# Patient Record
Sex: Female | Born: 1960 | Race: Black or African American | Hispanic: No | Marital: Single | Smoking: Never smoker
Health system: Southern US, Community
[De-identification: ages and names within clinical notes are randomized; demographics above are authoritative.]

## PROBLEM LIST (undated history)

## (undated) DIAGNOSIS — E119 Type 2 diabetes mellitus without complications: Secondary | ICD-10-CM

## (undated) DIAGNOSIS — N858 Other specified noninflammatory disorders of uterus: Secondary | ICD-10-CM

## (undated) HISTORY — DX: Other specified noninflammatory disorders of uterus: N85.8

## (undated) HISTORY — PX: CATARACT EXTRACTION: SUR2

---

## 2002-02-20 ENCOUNTER — Ambulatory Visit (HOSPITAL_COMMUNITY): Admission: RE | Admit: 2002-02-20 | Discharge: 2002-02-20 | Payer: Self-pay | Admitting: Internal Medicine

## 2002-02-20 ENCOUNTER — Encounter: Payer: Self-pay | Admitting: Internal Medicine

## 2002-02-21 ENCOUNTER — Encounter: Payer: Self-pay | Admitting: Internal Medicine

## 2002-02-21 ENCOUNTER — Ambulatory Visit (HOSPITAL_COMMUNITY): Admission: RE | Admit: 2002-02-21 | Discharge: 2002-02-21 | Payer: Self-pay | Admitting: Internal Medicine

## 2003-06-24 ENCOUNTER — Inpatient Hospital Stay (HOSPITAL_COMMUNITY): Admission: EM | Admit: 2003-06-24 | Discharge: 2003-06-30 | Payer: Self-pay | Admitting: Emergency Medicine

## 2004-02-07 ENCOUNTER — Inpatient Hospital Stay (HOSPITAL_COMMUNITY): Admission: EM | Admit: 2004-02-07 | Discharge: 2004-02-12 | Payer: Self-pay | Admitting: Emergency Medicine

## 2004-02-11 ENCOUNTER — Ambulatory Visit: Payer: Self-pay | Admitting: *Deleted

## 2014-01-25 ENCOUNTER — Other Ambulatory Visit (HOSPITAL_COMMUNITY): Payer: Self-pay | Admitting: Family

## 2014-01-25 DIAGNOSIS — R1900 Intra-abdominal and pelvic swelling, mass and lump, unspecified site: Secondary | ICD-10-CM

## 2014-01-29 ENCOUNTER — Ambulatory Visit (HOSPITAL_COMMUNITY): Payer: Self-pay

## 2014-02-07 ENCOUNTER — Ambulatory Visit (HOSPITAL_COMMUNITY): Admission: RE | Admit: 2014-02-07 | Payer: Self-pay | Source: Ambulatory Visit

## 2014-02-07 ENCOUNTER — Ambulatory Visit (HOSPITAL_COMMUNITY): Payer: Self-pay

## 2014-08-29 LAB — HEMOGLOBIN A1C: Hemoglobin A1C: 14.7

## 2014-08-30 ENCOUNTER — Other Ambulatory Visit (HOSPITAL_COMMUNITY): Payer: Self-pay | Admitting: Family

## 2014-08-30 DIAGNOSIS — R1900 Intra-abdominal and pelvic swelling, mass and lump, unspecified site: Secondary | ICD-10-CM

## 2014-09-06 ENCOUNTER — Other Ambulatory Visit (HOSPITAL_COMMUNITY): Payer: Self-pay

## 2014-10-26 ENCOUNTER — Ambulatory Visit (HOSPITAL_COMMUNITY): Payer: Medicaid Other

## 2014-10-26 ENCOUNTER — Ambulatory Visit (HOSPITAL_COMMUNITY)
Admission: RE | Admit: 2014-10-26 | Discharge: 2014-10-26 | Disposition: A | Discharge status: Home / Self Care | Payer: Medicaid Other | Source: Ambulatory Visit | Attending: Family | Admitting: Family

## 2014-10-26 ENCOUNTER — Other Ambulatory Visit (HOSPITAL_COMMUNITY): Payer: Self-pay | Admitting: Family

## 2014-10-26 DIAGNOSIS — R1900 Intra-abdominal and pelvic swelling, mass and lump, unspecified site: Secondary | ICD-10-CM

## 2014-10-26 DIAGNOSIS — R1904 Left lower quadrant abdominal swelling, mass and lump: Secondary | ICD-10-CM | POA: Diagnosis not present

## 2014-11-21 ENCOUNTER — Emergency Department (HOSPITAL_COMMUNITY): Payer: Medicaid Other

## 2014-11-21 ENCOUNTER — Encounter (HOSPITAL_COMMUNITY): Payer: Self-pay

## 2014-11-21 ENCOUNTER — Emergency Department (HOSPITAL_COMMUNITY)
Admission: EM | Admit: 2014-11-21 | Discharge: 2014-11-21 | Disposition: A | Discharge status: Home / Self Care | Payer: Medicaid Other | Attending: Emergency Medicine | Admitting: Emergency Medicine

## 2014-11-21 DIAGNOSIS — S0990XA Unspecified injury of head, initial encounter: Secondary | ICD-10-CM | POA: Insufficient documentation

## 2014-11-21 DIAGNOSIS — Y9389 Activity, other specified: Secondary | ICD-10-CM | POA: Insufficient documentation

## 2014-11-21 DIAGNOSIS — E109 Type 1 diabetes mellitus without complications: Secondary | ICD-10-CM | POA: Insufficient documentation

## 2014-11-21 DIAGNOSIS — Z79899 Other long term (current) drug therapy: Secondary | ICD-10-CM | POA: Insufficient documentation

## 2014-11-21 DIAGNOSIS — Y998 Other external cause status: Secondary | ICD-10-CM | POA: Insufficient documentation

## 2014-11-21 DIAGNOSIS — W01198A Fall on same level from slipping, tripping and stumbling with subsequent striking against other object, initial encounter: Secondary | ICD-10-CM | POA: Insufficient documentation

## 2014-11-21 DIAGNOSIS — R000 Tachycardia, unspecified: Secondary | ICD-10-CM | POA: Insufficient documentation

## 2014-11-21 DIAGNOSIS — R140 Abdominal distension (gaseous): Secondary | ICD-10-CM | POA: Insufficient documentation

## 2014-11-21 DIAGNOSIS — Z7982 Long term (current) use of aspirin: Secondary | ICD-10-CM | POA: Diagnosis not present

## 2014-11-21 DIAGNOSIS — R200 Anesthesia of skin: Secondary | ICD-10-CM | POA: Diagnosis not present

## 2014-11-21 DIAGNOSIS — E108 Type 1 diabetes mellitus with unspecified complications: Secondary | ICD-10-CM

## 2014-11-21 DIAGNOSIS — Z794 Long term (current) use of insulin: Secondary | ICD-10-CM | POA: Insufficient documentation

## 2014-11-21 DIAGNOSIS — Y9289 Other specified places as the place of occurrence of the external cause: Secondary | ICD-10-CM | POA: Insufficient documentation

## 2014-11-21 DIAGNOSIS — W19XXXA Unspecified fall, initial encounter: Secondary | ICD-10-CM

## 2014-11-21 HISTORY — DX: Type 2 diabetes mellitus without complications: E11.9

## 2014-11-21 LAB — CBC WITH DIFFERENTIAL/PLATELET
Basophils Absolute: 0 10*3/uL (ref 0.0–0.1)
Basophils Relative: 0 %
Eosinophils Absolute: 0 10*3/uL (ref 0.0–0.7)
Eosinophils Relative: 1 %
HCT: 42.5 % (ref 36.0–46.0)
Hemoglobin: 14.9 g/dL (ref 12.0–15.0)
Lymphocytes Relative: 18 %
Lymphs Abs: 1.6 10*3/uL (ref 0.7–4.0)
MCH: 29 pg (ref 26.0–34.0)
MCHC: 35.1 g/dL (ref 30.0–36.0)
MCV: 82.7 fL (ref 78.0–100.0)
Monocytes Absolute: 0.8 10*3/uL (ref 0.1–1.0)
Monocytes Relative: 10 %
Neutro Abs: 6.1 10*3/uL (ref 1.7–7.7)
Neutrophils Relative %: 71 %
Platelets: 194 10*3/uL (ref 150–400)
RBC: 5.14 MIL/uL — ABNORMAL HIGH (ref 3.87–5.11)
RDW: 12.6 % (ref 11.5–15.5)
WBC: 8.5 10*3/uL (ref 4.0–10.5)

## 2014-11-21 LAB — COMPREHENSIVE METABOLIC PANEL WITH GFR
ALT: 9 U/L — ABNORMAL LOW (ref 14–54)
AST: 18 U/L (ref 15–41)
Albumin: 4.3 g/dL (ref 3.5–5.0)
Alkaline Phosphatase: 49 U/L (ref 38–126)
Anion gap: 13 (ref 5–15)
BUN: 18 mg/dL (ref 6–20)
CO2: 24 mmol/L (ref 22–32)
Calcium: 9.5 mg/dL (ref 8.9–10.3)
Chloride: 100 mmol/L — ABNORMAL LOW (ref 101–111)
Creatinine, Ser: 0.86 mg/dL (ref 0.44–1.00)
GFR calc Af Amer: >60 mL/min
GFR calc non Af Amer: >60 mL/min
Glucose, Bld: 313 mg/dL — ABNORMAL HIGH (ref 65–99)
Potassium: 3.7 mmol/L (ref 3.5–5.1)
Sodium: 137 mmol/L (ref 135–145)
Total Bilirubin: 0.7 mg/dL (ref 0.3–1.2)
Total Protein: 8 g/dL (ref 6.5–8.1)

## 2014-11-21 LAB — URINALYSIS, ROUTINE W REFLEX MICROSCOPIC
Bilirubin Urine: NEGATIVE
Glucose, UA: >1000 mg/dL — AB
Ketones, ur: 15 mg/dL — AB
Leukocytes, UA: NEGATIVE
Nitrite: NEGATIVE
Protein, ur: NEGATIVE mg/dL
Specific Gravity, Urine: 1.02 (ref 1.005–1.030)
Urobilinogen, UA: 0.2 mg/dL (ref 0.0–1.0)
pH: 5.5 (ref 5.0–8.0)

## 2014-11-21 LAB — CBG MONITORING, ED: Glucose-Capillary: 283 mg/dL — ABNORMAL HIGH (ref 65–99)

## 2014-11-21 LAB — URINE MICROSCOPIC-ADD ON

## 2014-11-21 LAB — D-DIMER, QUANTITATIVE: D-Dimer, Quant: 0.93 ug{FEU}/mL — ABNORMAL HIGH (ref 0.00–0.48)

## 2014-11-21 MED ORDER — SODIUM CHLORIDE 0.9 % IV BOLUS (SEPSIS)
1000.0000 mL | Freq: Once | INTRAVENOUS | Status: AC
  Administered 2014-11-21: 1000 mL via INTRAVENOUS

## 2014-11-21 MED ORDER — IOHEXOL 350 MG/ML SOLN
100.0000 mL | Freq: Once | INTRAVENOUS | Status: AC | PRN
  Administered 2014-11-21: 100 mL via INTRAVENOUS

## 2014-11-21 MED ORDER — IOHEXOL 350 MG/ML SOLN: 80.0000 mL | Freq: Once | INTRAVENOUS | Status: DC | PRN

## 2014-11-21 NOTE — Discharge Instructions (Signed)
Tests showed no life-threatening condition. You must eat regular meals, increase fluids, take your insulin, check your glucose.

## 2014-11-21 NOTE — ED Notes (Signed)
Pt made aware to return if symptoms worsen or if any life threatening symptoms occur.   

## 2014-11-21 NOTE — ED Provider Notes (Addendum)
CSN: [id number]     Arrival date & time 11/21/14  0815 History  By signing my name below, I, Tula Nakayama, attest that this documentation has been prepared under the direction and in the presence of Nat Christen, MD.  Electronically Signed: Tula Nakayama, ED Scribe. 11/21/2014. 9:05 AM.   Chief Complaint  Patient presents with  . Fall   The history is provided by the patient. No language interpreter was used.    HPI Comments: Katherine Patton is a 54 y.o. female with a history of DM who presents to the Emergency Department complaining of constant, moderate left-sided head pain that started 2 days ago after she fell from a standing position. She states swelling of her abdomen that started a few days ago and numbness in her bilateral LE that started 1 month ago as associated symptoms. She also reports decreased fluid intake in the last few days. Pt reports that fall occurred secondary to numbness in her legs. She hit her left parietal head in the fall, but denies LOC. Pt takes Metformin and Levemir for DM, but has not measured blood sugar because she ran out of testing strips. She lives with her boyfriend. Pt denies visual disturbances, constipation, decreased appetite, fever, vomiting and diarrhea. Apparently patient has not been taking her insulin and not eating.  PCP Bradd Burner, [facility]  Past Medical History  Diagnosis Date  . Diabetes mellitus without complication [facility])    Past Surgical History  Procedure Laterality Date  . Cataract extraction     No family history on file. Social History  Substance Use Topics  . Smoking status: Never Smoker   . Smokeless tobacco: None  . Alcohol Use: No   OB History    No data available     Review of Systems  Constitutional: Negative for appetite change.  Eyes: Negative for visual disturbance.  Gastrointestinal: Positive for abdominal distention. Negative for vomiting, diarrhea and constipation.  Neurological: Positive  for numbness and headaches.  All other systems reviewed and are negative.  Allergies  Review of patient's allergies indicates no known allergies.  Home Medications   Prior to Admission medications   Medication Sig Start Date End Date Taking? Authorizing Provider  acetaminophen (TYLENOL) 500 MG tablet Take 500 mg by mouth every 6 (six) hours as needed for headache.   Yes Historical Provider, MD  aspirin 325 MG tablet Take 325 mg by mouth daily as needed for moderate pain.   Yes Historical Provider, MD  Difluprednate (DUREZOL) 0.05 % EMUL Apply 1 drop to eye 2 (two) times daily.   Yes Historical Provider, MD  insulin detemir (LEVEMIR) 100 UNIT/ML injection Inject 20 Units into the skin at bedtime.   Yes Historical Provider, MD  lisinopril (PRINIVIL,ZESTRIL) 2.5 MG tablet Take 2.5 mg by mouth daily.   Yes Historical Provider, MD  metFORMIN (GLUCOPHAGE) 500 MG tablet Take 500 mg by mouth 2 (two) times daily with a meal.   Yes Historical Provider, MD   BP 146/93 mmHg  Pulse 122  Temp(Src) 97.6 F (36.4 C) (Oral)  Resp 13  Ht 5' (1.524 m)  Wt 100 lb (45.36 kg)  BMI 19.53 kg/m2  SpO2 100%  LMP  Physical Exam  Constitutional: She appears well-developed and well-nourished. No distress.  HENT:  Head: Normocephalic and atraumatic.  Mild tenderness of left parietal area  Eyes: Conjunctivae and EOM are normal.  Neck: Neck supple. No tracheal deviation present.  Cardiovascular: Regular rhythm and normal heart sounds.  Tachycardic  Pulmonary/Chest: Effort normal and breath sounds normal. No respiratory distress. She has no wheezes.  Abdominal: She exhibits distension. There is no tenderness.  Skin: Skin is warm and dry.  Psychiatric: She has a normal mood and affect. Her behavior is normal.  Nursing note and vitals reviewed.   ED Course  Procedures  DIAGNOSTIC STUDIES: Oxygen Saturation is 100% on RA, normal by my interpretation.    COORDINATION OF CARE: 9:05 AM Discussed treatment  plan with pt which includes lab work. Pt agreed to plan.  Labs Review Labs Reviewed  COMPREHENSIVE METABOLIC PANEL - Abnormal; Notable for the following:    Chloride 100 (*)    Glucose, Bld 313 (*)    ALT 9 (*)    All other components within normal limits  CBC WITH DIFFERENTIAL/PLATELET - Abnormal; Notable for the following:    RBC 5.14 (*)    All other components within normal limits  URINALYSIS, ROUTINE W REFLEX MICROSCOPIC (NOT AT [facility]) - Abnormal; Notable for the following:    Glucose, UA >1000 (*)    Hgb urine dipstick TRACE (*)    Ketones, ur 15 (*)    All other components within normal limits  D-DIMER, QUANTITATIVE (NOT AT [facility]) - Abnormal; Notable for the following:    D-Dimer, Quant 0.93 (*)    All other components within normal limits  CBG MONITORING, ED - Abnormal; Notable for the following:    Glucose-Capillary 283 (*)    All other components within normal limits  URINE MICROSCOPIC-ADD ON    Imaging Review Ct Head Wo Contrast  11/21/2014  CLINICAL DATA:  Fall Sunday and struck back of head. To falls reported. Numbness in the legs. Diabetic. EXAM: CT HEAD WITHOUT CONTRAST TECHNIQUE: Contiguous axial images were obtained from the base of the skull through the vertex without intravenous contrast. COMPARISON:  None. FINDINGS: No intracranial hemorrhage. No parenchymal contusion. No midline shift or mass effect. Basilar cisterns are patent. No skull base fracture. No fluid in the paranasal sinuses or mastoid air cells. Orbits are normal. There is mild subcortical white matter hypodensities. IMPRESSION: 1. No intracranial trauma. 2. Subcortical white matter hypodensities consistent small vessel ischemia. Electronically Signed   By: Suzy Bouchard M.D.   On: 11/21/2014 11:36   Ct Angio Chest Pe W/cm &/or Wo Cm  11/21/2014  CLINICAL DATA:  fell SUnday and Monday and struck her head x 2. Reports she fell because her legs feel numb. Reports legs have been numb for the past few  months. Pt says head feels sore. Pt ran out of glucose test strips EXAM: CT ANGIOGRAPHY CHEST WITH CONTRAST TECHNIQUE: Multidetector CT imaging of the chest was performed using the standard protocol during bolus administration of intravenous contrast. Multiplanar CT image reconstructions and MIPs were obtained to evaluate the vascular anatomy. CONTRAST:  155mL OMNIPAQUE IOHEXOL 350 MG/ML SOLN COMPARISON:  None. FINDINGS: Left arm IV contrast injection. The innominate vein and SVC are patent. The RV is nondilated. Satisfactory opacification of pulmonary arteries noted, and there is no evidence of pulmonary emboli. Patent bilateral pulmonary veins. Adequate contrast opacification of the thoracic aorta with no evidence of dissection, aneurysm, or stenosis. There is classic 3-vessel brachiocephalic arch anatomy without proximal stenosis. No pleural or pericardial effusion. No hilar or mediastinal adenopathy. Linear scarring or atelectasis in the apical segment right upper lobe. Lungs otherwise clear. Thoracic spine and sternum intact. Hepatomegaly, without focal lesion, liver incompletely visualized. Remainder visualized upper abdomen unremarkable. Review of the MIP images  confirms the above findings. IMPRESSION: 1. Negative for acute PE or thoracic aortic dissection. 2. Hepatomegaly. Electronically Signed   By: Lucrezia Europe M.D.   On: 11/21/2014 11:24   I have personally reviewed and evaluated these lab results as part of my medical decision-making.   EKG Interpretation   Date/Time:  Wednesday November 21 2014 08:27:58 EST Ventricular Rate:  134 PR Interval:  126 QRS Duration: 63 QT Interval:  294 QTC Calculation: 439 R Axis:   49 Text Interpretation:  Sinus tachycardia LAE, consider biatrial enlargement  Artifact in lead(s) II aVR and baseline wander in lead(s) II aVR aVF  Confirmed by Dianey Suchy  MD, Adrien Dietzman ([id number]) on 11/21/2014 9:12:12 AM      MDM   Final diagnoses:  Fall, initial encounter  Type 1  diabetes mellitus with complication ([place])    Patient is in no acute distress. CT head and CT angio chest negative for acute findings. Her glucose is 313 but she is not in DKA.  She remains tachycardic. Discussed results with the patient and her sister and daughter. She will seek primary care follow-up.  I personally performed the services described in this documentation, which was scribed in my presence. The recorded information has been reviewed and is accurate.        Nat Christen, MD 11/21/14 [place], MD 11/21/14 [phone]

## 2014-11-21 NOTE — ED Notes (Signed)
Pt reports she fell SUnday and Monday and struck her head x 2.  Reports she fell because her legs feel numb.  Reports legs have been numb for the past few months.  Pt says head feels sore.  Pt ran out of glucose test strips and has been unable to check her sugar.

## 2014-11-28 LAB — HEMOGLOBIN A1C: HEMOGLOBIN A1C: 11.2

## 2015-03-06 LAB — HEMOGLOBIN A1C: Hemoglobin A1C: 15.6

## 2015-05-10 ENCOUNTER — Ambulatory Visit: Payer: Self-pay | Admitting: "Endocrinology

## 2015-06-19 ENCOUNTER — Encounter: Payer: Self-pay | Admitting: "Endocrinology

## 2015-06-19 ENCOUNTER — Ambulatory Visit (INDEPENDENT_AMBULATORY_CARE_PROVIDER_SITE_OTHER): Payer: Medicaid Other | Admitting: "Endocrinology

## 2015-06-19 ENCOUNTER — Encounter: Payer: Medicaid Other | Attending: "Endocrinology | Admitting: Nutrition

## 2015-06-19 VITALS — BP 162/96 | HR 123 | Ht 60.0 in | Wt 89.6 lb

## 2015-06-19 DIAGNOSIS — E1065 Type 1 diabetes mellitus with hyperglycemia: Secondary | ICD-10-CM

## 2015-06-19 DIAGNOSIS — IMO0002 Reserved for concepts with insufficient information to code with codable children: Secondary | ICD-10-CM

## 2015-06-19 DIAGNOSIS — E108 Type 1 diabetes mellitus with unspecified complications: Secondary | ICD-10-CM | POA: Diagnosis not present

## 2015-06-19 DIAGNOSIS — E1069 Type 1 diabetes mellitus with other specified complication: Secondary | ICD-10-CM

## 2015-06-19 DIAGNOSIS — E43 Unspecified severe protein-calorie malnutrition: Secondary | ICD-10-CM

## 2015-06-19 DIAGNOSIS — E118 Type 2 diabetes mellitus with unspecified complications: Secondary | ICD-10-CM | POA: Insufficient documentation

## 2015-06-19 MED ORDER — INSULIN GLULISINE 100 UNIT/ML SOLOSTAR PEN: 5.0000 [IU] | PEN_INJECTOR | Freq: Three times a day (TID) | SUBCUTANEOUS | Status: DC

## 2015-06-19 NOTE — Progress Notes (Signed)
Subjective:    Patient ID: Katherine Patton, female    DOB: August 02, 1960. Patient is being seen in consultation for management of diabetes requested by Bradd Burner, FNP.  Past Medical History  Diagnosis Date  . Diabetes mellitus without complication [facility])    Past Surgical History  Procedure Laterality Date  . Cataract extraction     Social History   Social History  . Marital Status: Single    Spouse Name: N/A  . Number of Children: N/A  . Years of Education: N/A   Social History Main Topics  . Smoking status: Never Smoker   . Smokeless tobacco: None  . Alcohol Use: No  . Drug Use: No  . Sexual Activity: Not Asked   Other Topics Concern  . None   Social History Narrative   Outpatient Encounter Prescriptions as of 06/19/2015  Medication Sig  . atenolol (TENORMIN) 25 MG tablet Take 25 mg by mouth daily.  Marland Kitchen gabapentin (NEURONTIN) 300 MG capsule Take 300 mg by mouth 2 (two) times daily.  . insulin detemir (LEVEMIR) 100 UNIT/ML injection Inject 40 Units into the skin at bedtime.   Marland Kitchen lisinopril (PRINIVIL,ZESTRIL) 2.5 MG tablet Take 2.5 mg by mouth daily.  . metFORMIN (GLUCOPHAGE) 500 MG tablet Take 500 mg by mouth 2 (two) times daily with a meal.  . acetaminophen (TYLENOL) 500 MG tablet Take 500 mg by mouth every 6 (six) hours as needed for headache.  Marland Kitchen aspirin 325 MG tablet Take 325 mg by mouth daily as needed for moderate pain.  . Difluprednate (DUREZOL) 0.05 % EMUL Apply 1 drop to eye 2 (two) times daily.   No facility-administered encounter medications on file as of 06/19/2015.   ALLERGIES: No Known Allergies VACCINATION STATUS:  There is no immunization history on file for this patient.  Diabetes She presents for her initial diabetic visit. She has type 1 (She is administratively classified as type 1 diabetes for management purposes.) diabetes mellitus. Onset time: She was diagnosed at approximate age of 26 years. Her disease course has been worsening. There are no  hypoglycemic associated symptoms. Pertinent negatives for hypoglycemia include no confusion, headaches, pallor or seizures. Associated symptoms include blurred vision, fatigue, polydipsia, polyuria and weight loss. Pertinent negatives for diabetes include no chest pain and no polyphagia. There are no hypoglycemic complications. Symptoms are worsening. Diabetic complications include peripheral neuropathy. Risk factors for coronary artery disease include diabetes mellitus, sedentary lifestyle and hypertension. Current diabetic treatment includes insulin injections and oral agent (monotherapy) (She is on Levemir 40 units daily at bedtime and metformin 1000 g by mouth twice a day). Her weight is decreasing steadily. She is following a generally unhealthy diet. When asked about meal planning, she reported none. She has not had a previous visit with a dietitian. She never participates in exercise. Home blood sugar record trend: She did not bring any meter nor logs to review today. An ACE inhibitor/angiotensin II receptor blocker is being taken. Eye exam is current.  Hypertension This is a chronic problem. The current episode started more than 1 year ago. Associated symptoms include blurred vision. Pertinent negatives include no chest pain, headaches, palpitations or shortness of breath. Risk factors for coronary artery disease include diabetes mellitus, dyslipidemia and sedentary lifestyle. Past treatments include ACE inhibitors and beta blockers.     Review of Systems  Constitutional: Positive for weight loss and fatigue. Negative for fever, chills and unexpected weight change.  HENT: Negative for trouble swallowing and voice change.  Eyes: Positive for blurred vision. Negative for visual disturbance.  Respiratory: Negative for cough, shortness of breath and wheezing.   Cardiovascular: Negative for chest pain, palpitations and leg swelling.  Gastrointestinal: Negative for nausea, vomiting and diarrhea.   Endocrine: Positive for polydipsia and polyuria. Negative for cold intolerance, heat intolerance and polyphagia.  Musculoskeletal: Positive for gait problem. Negative for myalgias and arthralgias.  Skin: Negative for color change, pallor, rash and wound.  Neurological: Negative for seizures and headaches.  Psychiatric/Behavioral: Negative for suicidal ideas and confusion.    Objective:    BP 162/96 mmHg  Pulse 123  Ht 5' (1.524 m)  Wt 89 lb 9.6 oz (40.642 kg)  BMI 17.50 kg/m2  SpO2 95%  Wt Readings from Last 3 Encounters:  06/19/15 89 lb 9.6 oz (40.642 kg)  11/21/14 100 lb (45.36 kg)    Physical Exam  Constitutional: She is oriented to person, place, and time.  Cachectic, chronically sick-looking  HENT:  Head: Normocephalic and atraumatic.  Eyes: EOM are normal.  Neck: Normal range of motion. Neck supple. No tracheal deviation present. No thyromegaly present.  Cardiovascular: Normal rate and regular rhythm.   Pulmonary/Chest: Effort normal and breath sounds normal.  Abdominal: Soft. Bowel sounds are normal. There is no tenderness. There is no guarding.  Musculoskeletal: She exhibits no edema.  She uses a cane to walk around. She has a global loss of skeletal muscle mass.  Neurological: She is alert and oriented to person, place, and time. She has normal reflexes. No cranial nerve deficit. Coordination normal.  Skin: Skin is warm and dry. No rash noted. No erythema. No pallor.  Psychiatric: She has a normal mood and affect. Judgment normal.    CMP     Component Value Date/Time   NA 137 11/21/2014 0900   K 3.7 11/21/2014 0900   CL 100* 11/21/2014 0900   CO2 24 11/21/2014 0900   GLUCOSE 313* 11/21/2014 0900   BUN 18 11/21/2014 0900   CREATININE 0.86 11/21/2014 0900   CALCIUM 9.5 11/21/2014 0900   PROT 8.0 11/21/2014 0900   ALBUMIN 4.3 11/21/2014 0900   AST 18 11/21/2014 0900   ALT 9* 11/21/2014 0900   ALKPHOS 49 11/21/2014 0900   BILITOT 0.7 11/21/2014 0900    GFRNONAA >60 11/21/2014 0900   GFRAA >60 11/21/2014 0900    Her A1c from 03/06/2015 was extremely high at 15.6%.   Assessment & Plan:    1. Uncontrolled type 1 diabetes mellitus with other specified complication [facility])  - Patient has currently uncontrolled symptomatic type 1 DM ( administrative reclassified as type I for management purposes) since  55 years of age,  with most recent A1c of 15.6 %.  -She has no recent labs to review, she would be sent for labs after her next visit in 1 week.    - Her diabetes is complicated by neuropathy, and recent unexplained weight loss and patient remains at a high risk for more acute and chronic complications of diabetes which include CAD, CVA, CKD, retinopathy, and neuropathy. These are all discussed in detail with the patient.  - I have counseled the patient on diet management  by adopting a carbohydrate restricted/protein rich diet.   - I encouraged the patient to switch to  and consume more unprocessed or minimally processed complex starch and increased protein intake (animal or plant source), fruits, and vegetables.  - Patient is advised to stick to a routine mealtimes to eat 3 meals  a day and some  light snacks, since she has a big weight deficit.   - The patient will be scheduled with Jearld Fenton, RDN, CDE for individualized DM education.  - I have approached patient with the following individualized plan to manage diabetes and patient agrees:   - She will be treated exclusively with insulin -basal /bolus basal insulin.  - To facilitate this, I have approached her for  strict monitoring of glucose  AC and HS. -I will readjust her basal insulin Levemir to 30 units daily at bedtime, initiate prandial insulin Apidra 5 units 3 times a day before meals for pre-meal blood glucose above 90 mg/dL. She will also use some Apidra for sliding scale when pre-meal blood glucose is above 150 mg/dL. - Patient is warned not to take insulin without proper  monitoring per orders. -Adjustment parameters are given for hypo and hyperglycemia in writing. -Patient is encouraged to call clinic for blood glucose levels less than 70 or above 300 mg /dl.  - I will discontinue  metformin, risk outweighs benefit for this patient.  - She is not a candidate for metformin, SGLT2 inhibitors, nor  incretin therapy.   - Patient specific target  A1c;  LDL, HDL, Triglycerides, and  Waist Circumference were discussed in detail.  2) BP/HTN:  Controlled. Continue current medications including ACEI/ARB. 3) Lipids/HPL:  Uncontrolled, LDL at 111. She will be considered for statin therapy on subsequent visits. 4)  Weight/Diet: CDE Consult will be initiated , exercise, and detailed carbohydrates information provided.  5) Chronic Care/Health Maintenance:  -Patient is on ACEI/ARB medications and encouraged to continue to follow up with Ophthalmology, Podiatrist at least yearly or according to recommendations, and advised to   stay away from smoking. I have recommended yearly flu vaccine and pneumonia vaccination at least every 5 years; moderate intensity exercise for up to 150 minutes weekly; and  sleep for at least 7 hours a day.  - 60 minutes of time was spent on the care of this patient , 50% of which was applied for counseling on diabetes complications and their preventions.  - Patient to bring meter and  blood glucose logs during their next visit.   - I advised patient to maintain close follow up with House, Deliah Goody, FNP for primary care needs.  Follow up plan: - No Follow-up on file.  Glade Lloyd, MD Phone: [phone]  Fax: [phone]   06/19/2015, 11:23 AM

## 2015-06-26 ENCOUNTER — Encounter: Payer: Self-pay | Admitting: "Endocrinology

## 2015-06-26 ENCOUNTER — Encounter: Payer: Medicaid Other | Admitting: Nutrition

## 2015-06-26 ENCOUNTER — Ambulatory Visit (INDEPENDENT_AMBULATORY_CARE_PROVIDER_SITE_OTHER): Payer: Medicaid Other | Admitting: "Endocrinology

## 2015-06-26 ENCOUNTER — Encounter: Payer: Self-pay | Admitting: Nutrition

## 2015-06-26 VITALS — BP 146/90 | HR 116 | Ht 60.0 in | Wt 92.4 lb

## 2015-06-26 VITALS — Ht 60.0 in | Wt 92.0 lb

## 2015-06-26 DIAGNOSIS — IMO0002 Reserved for concepts with insufficient information to code with codable children: Secondary | ICD-10-CM | POA: Insufficient documentation

## 2015-06-26 DIAGNOSIS — E1065 Type 1 diabetes mellitus with hyperglycemia: Secondary | ICD-10-CM

## 2015-06-26 DIAGNOSIS — E1069 Type 1 diabetes mellitus with other specified complication: Secondary | ICD-10-CM

## 2015-06-26 DIAGNOSIS — E108 Type 1 diabetes mellitus with unspecified complications: Principal | ICD-10-CM

## 2015-06-26 DIAGNOSIS — R636 Underweight: Secondary | ICD-10-CM

## 2015-06-26 DIAGNOSIS — D259 Leiomyoma of uterus, unspecified: Secondary | ICD-10-CM | POA: Insufficient documentation

## 2015-06-26 DIAGNOSIS — R1900 Intra-abdominal and pelvic swelling, mass and lump, unspecified site: Secondary | ICD-10-CM

## 2015-06-26 DIAGNOSIS — I10 Essential (primary) hypertension: Secondary | ICD-10-CM | POA: Diagnosis not present

## 2015-06-26 DIAGNOSIS — E43 Unspecified severe protein-calorie malnutrition: Secondary | ICD-10-CM

## 2015-06-26 DIAGNOSIS — E118 Type 2 diabetes mellitus with unspecified complications: Secondary | ICD-10-CM | POA: Diagnosis not present

## 2015-06-26 MED ORDER — INSULIN ASPART 100 UNIT/ML FLEXPEN: 8.0000 [IU] | PEN_INJECTOR | Freq: Three times a day (TID) | SUBCUTANEOUS | Status: DC

## 2015-06-26 MED ORDER — LISINOPRIL 10 MG PO TABS: 10.0000 mg | ORAL_TABLET | Freq: Every day | ORAL | Status: DC

## 2015-06-26 NOTE — Progress Notes (Signed)
  Medical Nutrition Therapy:  Appt start time: 1200 end time:  1230.   Assessment:  Primary concerns today: Diabetes Type 1. She feels better. BS meter and logs brought in. BS still higher in 200-300's but overall much better. She has cut out sodas, sweets, junk food.  Working on eating three meals per day. Drinking only water. Limited access to food due to no income. Gets $20 for food stamps. Eats three meals per day. Eating more vegetables and fresher meats.  Saw Dr. Dorris Fetch today.  Gained 3 lbs since last week. Taking  30 units of Levemir, stopped Metformin and will use Apidra with meals. She is taking meds prescribed. She feels like she is eating better and healthier foods. Handout given for local food pantries for food assistance. She is very emaciated and cachetic. No body fat. Signs of muscle wasting. Bitemporal wasting, skeletal muscles protruding with bony  Prominents. Current condition may be related to poor diabetes control.  Hair is dry and brittle. Skin very thin. She has protein calorie malnutrition. Walks with a cane. Very feeble in walking.   Preferred Learning Style:   Auditory  Visual  Hands on    Learning Readiness:     Ready  Change in progress   MEDICATIONS: See list   DIETARY INTAKE:  24Hr: B)  Eggs/susage biscuit sometimes; hungry man tv dinner, water Nonel L) Broccoli, rice and beef, water Dinner: CHicken nuggests, water   Usual physical activity: ADL  Estimated energy needs: 1500 calories 170 g carbohydrates 112 g protein 42 g fat  Progress Towards Goal(s):  In progress.   Nutritional Diagnosis:  NB-1.1 Food and nutrition-related knowledge deficit As related to Diabetes.  As evidenced by A1C >15%.    Intervention: . High Calorie High Protein Diet. Ways to add calories and protein with all meals.  Goals 1. Follow Plate MEthod 2. Increase  calories with whole milk, adding gravy, butter/margerine.  3. Inject insulin in legs for now. 5. Eat more  3 carb choices per meal, 6. Drink 1 carnation insteat breakfast daily. 7. Nurse, adult for food assistance.  Teaching Method Utilized:  Visual Auditory Hands on  Handouts given during visit include:  The Plate Method  Meal Plan Card  Ways to increase protein and calories..   Barriers to learning/adherence to lifestyle change:  Feeble due to physical condition.   Demonstrated degree of understanding via:  Teach Back   Monitoring/Evaluation:  Dietary intake, exercise, meal planning, SBG, and body weight in 2 week(s).

## 2015-06-26 NOTE — Patient Instructions (Signed)
Goals 1. Follow Plate MEthod 2. Increase  calories with whole milk, adding gravy, butter/margerine.  3. Inject insulin in legs for now. 5. Eat more 3 carb choices per meal, 6. Drink 1 carnation insteat breakfast daily. 7. Nurse, adult for food assistance.

## 2015-06-26 NOTE — Progress Notes (Signed)
Subjective:    Patient ID: Katherine Patton, female    DOB: [birth year]. Patient is being seen in f/u for management of diabetes requested by Bradd Burner, FNP.  Past Medical History  Diagnosis Date  . Diabetes mellitus without complication [facility])    Past Surgical History  Procedure Laterality Date  . Cataract extraction     Social History   Social History  . Marital Status: Single    Spouse Name: N/A  . Number of Children: N/A  . Years of Education: N/A   Social History Main Topics  . Smoking status: Never Smoker   . Smokeless tobacco: None  . Alcohol Use: No  . Drug Use: No  . Sexual Activity: Not Asked   Other Topics Concern  . None   Social History Narrative   Outpatient Encounter Prescriptions as of 06/26/2015  Medication Sig  . acetaminophen (TYLENOL) 500 MG tablet Take 500 mg by mouth every 6 (six) hours as needed for headache.  Marland Kitchen aspirin 325 MG tablet Take 325 mg by mouth daily as needed for moderate pain.  Marland Kitchen atenolol (TENORMIN) 25 MG tablet Take 25 mg by mouth daily.  . Difluprednate (DUREZOL) 0.05 % EMUL Apply 1 drop to eye 2 (two) times daily.  Marland Kitchen gabapentin (NEURONTIN) 300 MG capsule Take 300 mg by mouth 2 (two) times daily.  . insulin aspart (NOVOLOG) 100 UNIT/ML FlexPen Inject 8-14 Units into the skin 3 (three) times daily with meals.  . insulin detemir (LEVEMIR) 100 UNIT/ML injection Inject 30 Units into the skin at bedtime.  Marland Kitchen lisinopril (PRINIVIL,ZESTRIL) 10 MG tablet Take 1 tablet (10 mg total) by mouth daily.  . [DISCONTINUED] Insulin Glulisine (APIDRA) 100 UNIT/ML Solostar Pen Inject 5-11 Units into the skin 3 (three) times daily with meals.  . [DISCONTINUED] lisinopril (PRINIVIL,ZESTRIL) 2.5 MG tablet Take 10 mg by mouth daily.   No facility-administered encounter medications on file as of 06/26/2015.   ALLERGIES: No Known Allergies VACCINATION STATUS:  There is no immunization history on file for this patient.  Diabetes She presents for her  initial diabetic visit. She has type 1 (She is administratively classified as type 1 diabetes for management purposes.) diabetes mellitus. Onset time: She was diagnosed at approximate age of 30 years. Her disease course has been improving. There are no hypoglycemic associated symptoms. Pertinent negatives for hypoglycemia include no confusion, headaches, pallor or seizures. Associated symptoms include blurred vision, fatigue, polydipsia, polyuria and weight loss. Pertinent negatives for diabetes include no chest pain and no polyphagia. There are no hypoglycemic complications. Symptoms are improving. Diabetic complications include peripheral neuropathy. Risk factors for coronary artery disease include diabetes mellitus, sedentary lifestyle and hypertension. Current diabetic treatment includes insulin injections and oral agent (monotherapy) (She is on Levemir 30 units daily at bedtime and Apidra 5-11 units TIDAC.). She is compliant with treatment most of the time. Her weight is increasing steadily (She gained 3 pounds since last visit a week ago.). She is following a generally unhealthy diet. When asked about meal planning, she reported none. She has had a previous visit with a dietitian. She never participates in exercise. Her home blood glucose trend is decreasing steadily. Her breakfast blood glucose range is generally >200 mg/dl. Her lunch blood glucose range is generally >200 mg/dl. Her dinner blood glucose range is generally >200 mg/dl. Her overall blood glucose range is >200 mg/dl. An ACE inhibitor/angiotensin II receptor blocker is being taken. Eye exam is current.  Hypertension This is a chronic problem. The  current episode started more than 1 year ago. Associated symptoms include blurred vision. Pertinent negatives include no chest pain, headaches, palpitations or shortness of breath. Risk factors for coronary artery disease include diabetes mellitus, dyslipidemia and sedentary lifestyle. Past treatments  include ACE inhibitors and beta blockers.     Review of Systems  Constitutional: Positive for weight loss and fatigue. Negative for fever, chills and unexpected weight change.  HENT: Negative for trouble swallowing and voice change.   Eyes: Positive for blurred vision. Negative for visual disturbance.  Respiratory: Negative for cough, shortness of breath and wheezing.   Cardiovascular: Negative for chest pain, palpitations and leg swelling.  Gastrointestinal: Negative for nausea, vomiting and diarrhea.  Endocrine: Positive for polydipsia and polyuria. Negative for cold intolerance, heat intolerance and polyphagia.  Musculoskeletal: Positive for gait problem. Negative for myalgias and arthralgias.  Skin: Negative for color change, pallor, rash and wound.  Neurological: Negative for seizures and headaches.  Psychiatric/Behavioral: Negative for suicidal ideas and confusion.    Objective:    BP 146/90 mmHg  Pulse 116  Ht 5' (1.524 m)  Wt 92 lb 6.4 oz (41.912 kg)  BMI 18.05 kg/m2  Wt Readings from Last 3 Encounters:  06/26/15 92 lb (41.731 kg)  06/26/15 92 lb 6.4 oz (41.912 kg)  06/19/15 89 lb 9.6 oz (40.642 kg)    Physical Exam  Constitutional: She is oriented to person, place, and time.  Cachectic, chronically sick-looking  HENT:  Head: Normocephalic and atraumatic.  Eyes: EOM are normal.  Neck: Normal range of motion. Neck supple. No tracheal deviation present. No thyromegaly present.  Cardiovascular: Normal rate and regular rhythm.   Pulmonary/Chest: Effort normal and breath sounds normal.  Abdominal: Soft. Bowel sounds are normal. There is no tenderness. There is no guarding.  Musculoskeletal: She exhibits no edema.  She uses a cane to walk around. She has a global loss of skeletal muscle mass.  Neurological: She is alert and oriented to person, place, and time. She has normal reflexes. No cranial nerve deficit. Coordination normal.  Skin: Skin is warm and dry. No rash  noted. No erythema. No pallor.  Psychiatric: She has a normal mood and affect. Judgment normal.    CMP     Component Value Date/Time   NA 137 11/21/2014 0900   K 3.7 11/21/2014 0900   CL 100* 11/21/2014 0900   CO2 24 11/21/2014 0900   GLUCOSE 313* 11/21/2014 0900   BUN 18 11/21/2014 0900   CREATININE 0.86 11/21/2014 0900   CALCIUM 9.5 11/21/2014 0900   PROT 8.0 11/21/2014 0900   ALBUMIN 4.3 11/21/2014 0900   AST 18 11/21/2014 0900   ALT 9* 11/21/2014 0900   ALKPHOS 49 11/21/2014 0900   BILITOT 0.7 11/21/2014 0900   GFRNONAA >60 11/21/2014 0900   GFRAA >60 11/21/2014 0900    Her A1c from 03/06/2015 was extremely high at 15.6%.   Assessment & Plan:    1. Uncontrolled type 1 diabetes mellitus with other specified complication [facility])  - Patient has currently uncontrolled symptomatic type 1 DM ( administrative reclassified as type I for management purposes) since  55 years of age,  with most recent A1c of 15.6 %.  -She Came with improved however still significantly above target blood glucose profile despite basal/bolus insulin started last visit a week ago.   - Her diabetes is complicated by neuropathy, and recent unexplained weight loss and patient remains at a high risk for more acute and chronic complications of diabetes which  include CAD, CVA, CKD, retinopathy, and neuropathy. These are all discussed in detail with the patient.  - I have counseled the patient on diet management  by adopting a carbohydrate restricted/protein rich diet.   - I encouraged the patient to switch to  and consume more unprocessed or minimally processed complex starch and increased protein intake (animal or plant source), fruits, and vegetables.  - Patient is advised to stick to a routine mealtimes to eat 3 meals  a day and some light snacks, since she has a big weight deficit.   - The patient will be scheduled with Jearld Fenton, RDN, CDE for individualized DM education.  - I have approached  patient with the following individualized plan to manage diabetes and patient agrees:   - She will be treated exclusively with insulin -basal /bolus basal insulin.  - To facilitate this, I have approached her to stay engaged in strict monitoring of glucose  AC and HS. -I will readjust her basal insulin Levemir to 30 units daily at bedtime, increase prandial insulin Apidra to 8 units 3 times a day before meals for pre-meal blood glucose above 90 mg/dL. She will also use some Apidra for sliding scale when pre-meal blood glucose is above 150 mg/dL. - Patient is warned not to take insulin without proper monitoring per orders. -Adjustment parameters are given for hypo and hyperglycemia in writing. -Patient is encouraged to call clinic for blood glucose levels less than 70 or above 300 mg /dl.  - She is not a candidate for metformin, SGLT2 inhibitors, nor  incretin therapy.   - Patient specific target  A1c;  LDL, HDL, Triglycerides, and  Waist Circumference were discussed in detail.  2) BP/HTN:  Controlled. Continue current medications including ACEI/ARB. 3) Lipids/HPL:  Uncontrolled, LDL at 111. She will be considered for statin therapy on subsequent visits. 4)  Weight/Diet: CDE Consult will be initiated , exercise, and detailed carbohydrates information provided. - She has a history is significant for progressive weight loss of approximately 50 pounds over 2-3 years time. This is largely unexplained weight loss. She has a primary care at [facility]. Review of her records in the computer system reveals that she did have large fibroid based on CT scan done in 2004. However there was no subsequent CT scan. Abdominal ultrasound in October 2016 was unremarkable. However given unexplained significant weight loss in recent years, she would need repeat imaging study preferably with CT abdomen/pelvis. I will proceed to order this tests for her.  -If abdominal mass is still there, she may  benefit from exploration by surgery.   5) Chronic Care/Health Maintenance:  -Patient is on ACEI/ARB medications and encouraged to continue to follow up with Ophthalmology, Podiatrist at least yearly or according to recommendations, and advised to   stay away from smoking. I have recommended yearly flu vaccine and pneumonia vaccination at least every 5 years; moderate intensity exercise for up to 150 minutes weekly; and  sleep for at least 7 hours a day.  - 25 minutes of time was spent on the care of this patient , 50% of which was applied for counseling on diabetes complications and their preventions.  - Patient to bring meter and  blood glucose logs during their next visit.   - I advised patient to maintain close follow up with House, Deliah Goody, FNP for primary care needs.  Follow up plan: - Return in about 2 weeks (around 07/10/2015) for diabetes, high blood pressure, follow up with  meter and logs- no labs.  Glade Lloyd, MD Phone: [phone]  Fax: [phone]   06/26/2015, 1:19 PM

## 2015-06-28 NOTE — Progress Notes (Signed)
  Medical Nutrition Therapy:  Appt start time: 0930 end time:  1000.   Assessment:  Primary concerns today: Type 1 Dm ?Marland Kitchen Has been treated as a Type 2 possibly instead of type 1. Sees Dr. Dorris Fetch now for her Endocrinologist. PCP is from [facility]. She is here to see Dr. Dorris Fetch. Her boyfriend is with her, Katherine Patton. A1C 15.6%.  Due to short visit, safety questions will be answered at next visit.  FBS 380 mg/dl in clinic. She is currently on 40 units of Levemir and Metformin 2 g/d. Is very hungry all the time, increased thirst and weight loss. She is very cachetic, no body fat. Bony prominents.Signs of muscle wasting  She walks with a cane. Balance is not good. She has an undetermined about of weight. Eyes are sunk in, thin frail tissue, dry brittle hair. Complains of constant fatigue, no energy, depressed and no energy. Very thirsty all the time. She has not seen a dietitian before. She doesn't have any income. Gets $20 in food stamps. Diet is insuffient to meet her nutritional needs. She appears to be protein calorie malnourished.  Preferred Learning Style:   Auditory  Visual  Hands on  No preference indicated   Learning Readiness:   Ready  Change in progress  MEDICATIONS: See list  DIETARY INTAKE:   Eats 2-3 meals per day. Stays thirsty. Snacks some when she is hungry. Was drinking a lot of sodas and junk food in the past but knows not to do that now.  Usual physical activity: ADL  Estimated energy needs: 1500 calories 170 g carbohydrates 112 g protein 42 g fat  Progress Towards Goal(s):  In progress.   Nutritional Diagnosis:  NB-1.1 Food and nutrition-related knowledge deficit As related to DM.  As evidenced by A1C 15.6%. .    Intervention:  Nutrition and Diabetes education provided on My Plate, CHO counting, meal planning, portion sizes, timing of meals, avoiding snacks between meals unless having a low blood sugar, target ranges for A1C and blood sugars,  signs/symptoms and treatment of hyper/hypoglycemia, monitoring blood sugars, taking medications as prescribed, benefits of exercising 30 minutes per day and prevention of complications of DM.  Goals:  1. Follow Plate Method 2. Increase fresh fruits and vegetables. 3. Eat 3-4 carb choices at meals. 4. Eat meals on time. 5. Do not skip meals. 6. Take insulin as prescribed. 7. Drink only water 8. Cut out junk food, sodas, candy, sweets and processed foods. 9. Work towards getting A1C down to 7%.     Teaching Method Utilized: Visual Auditory Hands on  Handouts given during visit include:  The Plate MEthod  Meal Plan Card  Diabetes Instructions.   Barriers to learning/adherence to lifestyle change: None  Demonstrated degree of understanding via:  Teach Back   Monitoring/Evaluation:  Dietary intake, exercise, meal planning, SBG, and body weight in 1 month(s).

## 2015-06-28 NOTE — Patient Instructions (Signed)
Goals:  1. Follow Plate Method 2. Increase fresh fruits and vegetables. 3. Eat 3-4 carb choices at meals. 4. Eat meals on time. 5. Do not skip meals. 6. Take insulin as prescribed. 7. Drink only water 8. Cut out junk food, sodas, candy, sweets and processed foods. 9. Work towards getting A1C down to 7%.

## 2015-07-03 ENCOUNTER — Telehealth: Payer: Self-pay

## 2015-07-03 NOTE — Telephone Encounter (Signed)
PA request faxed to [employer]

## 2015-07-05 ENCOUNTER — Telehealth: Payer: Self-pay

## 2015-07-05 ENCOUNTER — Ambulatory Visit (HOSPITAL_COMMUNITY): Admission: RE | Admit: 2015-07-05 | Payer: Medicaid Other | Source: Ambulatory Visit

## 2015-07-05 NOTE — Telephone Encounter (Signed)
PA Approval for CT faxed over at 10 a.m. this morning.

## 2015-07-10 ENCOUNTER — Ambulatory Visit: Payer: Medicaid Other | Admitting: "Endocrinology

## 2015-07-10 ENCOUNTER — Encounter: Payer: Medicaid Other | Admitting: Nutrition

## 2015-07-10 ENCOUNTER — Ambulatory Visit (HOSPITAL_COMMUNITY): Payer: Medicaid Other

## 2015-07-10 ENCOUNTER — Telehealth: Payer: Self-pay | Admitting: Nutrition

## 2015-07-10 VITALS — Ht 60.0 in | Wt 96.0 lb

## 2015-07-10 DIAGNOSIS — Z794 Long term (current) use of insulin: Principal | ICD-10-CM

## 2015-07-10 DIAGNOSIS — E43 Unspecified severe protein-calorie malnutrition: Secondary | ICD-10-CM

## 2015-07-10 DIAGNOSIS — E1165 Type 2 diabetes mellitus with hyperglycemia: Secondary | ICD-10-CM

## 2015-07-10 DIAGNOSIS — E118 Type 2 diabetes mellitus with unspecified complications: Principal | ICD-10-CM

## 2015-07-10 DIAGNOSIS — IMO0002 Reserved for concepts with insufficient information to code with codable children: Secondary | ICD-10-CM

## 2015-07-10 NOTE — Telephone Encounter (Signed)
TC to pt to see how she was doing after her visit today. She notes she gave herself 12 units of short acting insulin. Testing blood sugars now is 468 mg/dl. Talked to Dr. Dorris Fetch. He advised pt to take her 16 units of short acting insulin before dinner and her 40 units of long acting tonight at 830. Pt verbalized understanding.

## 2015-07-10 NOTE — Progress Notes (Signed)
  Medical Nutrition Therapy:  Appt start time: 0930 end time:  1000.  Assessment:  Primary concerns today: Diabetes Type 1. Her with her boyfriend. Says she is doing better. BS remain elevated. Still drinking soda. Doesn't have a large appetite.Doesn't eat much at meal times.  Stays thirsty. BS remain > 200-300's. Hasn't gotten CT scan done yet on her abdomen.    Drinking some  water. Limited access to food due to no income. Gets $20 for food stamps. Eats three meals per day. Eating more vegetables and fresher meats.  Saw Dr. Dorris Fetch today.  Gained 3 lbs since last week. Taking  30 units of Levemir, stopped Metformin and will use Apidra with meals. She is taking meds prescribed. She feels like she is eating better and healthier foods. Handout given for local food pantries for food assistance. She is very emaciated and cachetic. No body fat. Signs of muscle wasting. Bitemporal wasting, skeletal muscles protruding with bony  Prominents. Current condition may be related to poor diabetes control.  Hair is dry and brittle. Skin very thin. She has protein calorie malnutrition. Walks with a cane. Very feeble in walking.   Preferred Learning Style:   Auditory  Visual  Hands on    Learning Readiness:     Ready  Change in progress   MEDICATIONS: See list   DIETARY INTAKE:  24Hr: Eating 2-3 times per day. Still drinking sodas and eating more junk food. Eats small amounts of food.   Usual physical activity: ADL  Estimated energy needs: 1500 calories 170 g carbohydrates 112 g protein 42 g fat  Progress Towards Goal(s):  In progress.   Nutritional Diagnosis:  NB-1.1 Food and nutrition-related knowledge deficit As related to Diabetes.  As evidenced by A1C >15%.    Intervention: . High Calorie High Protein Diet. Ways to add calories and protein with all meals.  Goals 1. Follow Plate MEthod 2. Increase  calories with whole milk, adding gravy, butter/margerine.  3. Inject insulin in legs  for now. 5. Eat more 3 carb choices per meal, 6. Drink 1 carnation instant breakfast daily. 7. Nurse, adult for food assistance.  Teaching Method Utilized:  Visual Auditory Hands on  Handouts given during visit include:  The Plate Method  Meal Plan Card  Ways to increase protein and calories..   Barriers to learning/adherence to lifestyle change:  Feeble due to physical condition.   Demonstrated degree of understanding via:  Teach Back   Monitoring/Evaluation:  Dietary intake, exercise, meal planning, SBG, and body weight in 2 week(s).

## 2015-07-10 NOTE — Patient Instructions (Addendum)
Goals Take insulin before meals and at bedtime as prescribed. Drink 1 Carnation Instant Breakfast with each meal for nutritional needs. Do not skip meals. Cut out sodas. Increase fat and protein with meals. Test blood sugar 4 times per day. Gain 2 lbs per month. Get A1C down to 9%. Follow up with getting a CT scan done.

## 2015-07-17 ENCOUNTER — Ambulatory Visit (HOSPITAL_COMMUNITY)
Admission: RE | Admit: 2015-07-17 | Discharge: 2015-07-17 | Disposition: A | Discharge status: Home / Self Care | Payer: Medicaid Other | Source: Ambulatory Visit | Attending: "Endocrinology | Admitting: "Endocrinology

## 2015-07-17 DIAGNOSIS — D251 Intramural leiomyoma of uterus: Secondary | ICD-10-CM | POA: Diagnosis not present

## 2015-07-17 DIAGNOSIS — R1900 Intra-abdominal and pelvic swelling, mass and lump, unspecified site: Secondary | ICD-10-CM | POA: Diagnosis present

## 2015-07-17 MED ORDER — IOPAMIDOL (ISOVUE-300) INJECTION 61%
100.0000 mL | Freq: Once | INTRAVENOUS | Status: AC | PRN
  Administered 2015-07-17: 100 mL via INTRAVENOUS

## 2015-07-24 ENCOUNTER — Encounter: Payer: Medicaid Other | Attending: "Endocrinology | Admitting: Nutrition

## 2015-07-24 VITALS — Ht 60.0 in | Wt 98.4 lb

## 2015-07-24 DIAGNOSIS — IMO0002 Reserved for concepts with insufficient information to code with codable children: Secondary | ICD-10-CM

## 2015-07-24 DIAGNOSIS — E118 Type 2 diabetes mellitus with unspecified complications: Secondary | ICD-10-CM | POA: Insufficient documentation

## 2015-07-24 DIAGNOSIS — E108 Type 1 diabetes mellitus with unspecified complications: Secondary | ICD-10-CM

## 2015-07-24 DIAGNOSIS — E43 Unspecified severe protein-calorie malnutrition: Secondary | ICD-10-CM

## 2015-07-24 DIAGNOSIS — E1065 Type 1 diabetes mellitus with hyperglycemia: Secondary | ICD-10-CM

## 2015-07-24 DIAGNOSIS — R636 Underweight: Secondary | ICD-10-CM

## 2015-07-24 NOTE — Progress Notes (Signed)
  Medical Nutrition Therapy:  Appt start time: 1200 end time:  1230.   Assessment:  Primary concerns today: Diabetes Type 1. Feels better. Taking 40 units at night. Here with her boyfriend. Still drinking sodas and not eating much according to him.. Has gained 6 lbs up to 98 lbs from 92 lbs previously and 89 lbs prior to that. BS remain high.  She is making some progress but remains very emaciated and with protein calorie malnutrition. Being evaluated by OBGYN for uterine mass. BS are still in the 200-300's. Skips meals at times due to not being hungry. Drinking some water. Dehydrated due to elevated blood sugars.    Linus Mako with a cane. Very feeble in walking.   Preferred Learning Style:   Auditory  Visual  Hands on    Learning Readiness:     Ready  Change in progress   MEDICATIONS: See list   DIETARY INTAKE:  24Hr: B)  Sausage and egg crissant and carrots, water  Nonel L) 1/2 ham and cheese sandwich and 4 pieces of candy due to BS being 71 mg/dl.  Dinner: Radiation protection practitioner and cheese sandwich on white bread, water   Usual physical activity: ADL  Estimated energy needs: 1500 calories 170 g carbohydrates 112 g protein 42 g fat  Progress Towards Goal(s):  In progress.   Nutritional Diagnosis:  NB-1.1 Food and nutrition-related knowledge deficit As related to Diabetes.  As evidenced by A1C >15%.    Intervention: . High Calorie High Protein Diet. Ways to add calories and protein with all meals.  Goals 1. Follow Plate MEthod Cut out sodas. Don't skip meals or insulin. 2. Increase  calories with whole milk, adding gravy, butter/margerine.  3.  Eat more 3 carb choices per meal, 6. Drink 1 carnation insteat breakfast daily. 7. Nurse, adult for food assistance.  Teaching Method Utilized:  Visual Auditory Hands on  Handouts given during visit include:  The Plate Method  Meal Plan Card  Ways to increase protein and calories..   Barriers to  learning/adherence to lifestyle change:  Feeble due to physical condition.   Demonstrated degree of understanding via:  Teach Back   Monitoring/Evaluation:  Dietary intake, exercise, meal planning, SBG, and body weight in 1 month(s).

## 2015-07-24 NOTE — Patient Instructions (Signed)
Goals 1. Follow Plate MEthod Cut out sodas. Don't skip meals or insulin. 2. Increase  calories with whole milk, adding gravy, butter/margerine.  3.  Eat more 3 carb choices per meal, 6. Drink 1 carnation insteat breakfast daily. 7. Nurse, adult for food assistance.

## 2015-07-30 ENCOUNTER — Encounter: Payer: Self-pay | Admitting: *Deleted

## 2015-07-31 ENCOUNTER — Encounter: Payer: Self-pay | Admitting: "Endocrinology

## 2015-07-31 ENCOUNTER — Other Ambulatory Visit: Payer: Self-pay | Admitting: Obstetrics and Gynecology

## 2015-07-31 ENCOUNTER — Ambulatory Visit (INDEPENDENT_AMBULATORY_CARE_PROVIDER_SITE_OTHER): Payer: Medicaid Other | Admitting: Obstetrics and Gynecology

## 2015-07-31 ENCOUNTER — Encounter: Payer: Self-pay | Admitting: Obstetrics and Gynecology

## 2015-07-31 ENCOUNTER — Ambulatory Visit (INDEPENDENT_AMBULATORY_CARE_PROVIDER_SITE_OTHER): Payer: Medicaid Other | Admitting: "Endocrinology

## 2015-07-31 ENCOUNTER — Other Ambulatory Visit (HOSPITAL_COMMUNITY)
Admission: RE | Admit: 2015-07-31 | Discharge: 2015-07-31 | Disposition: A | Discharge status: Home / Self Care | Payer: Medicaid Other | Source: Ambulatory Visit | Attending: Obstetrics and Gynecology | Admitting: Obstetrics and Gynecology

## 2015-07-31 VITALS — BP 126/80 | Ht 60.0 in | Wt 99.5 lb

## 2015-07-31 DIAGNOSIS — Z01419 Encounter for gynecological examination (general) (routine) without abnormal findings: Secondary | ICD-10-CM | POA: Diagnosis present

## 2015-07-31 DIAGNOSIS — E0800 Diabetes mellitus due to underlying condition with hyperosmolarity without nonketotic hyperglycemic-hyperosmolar coma (NKHHC): Secondary | ICD-10-CM | POA: Diagnosis not present

## 2015-07-31 DIAGNOSIS — Z1151 Encounter for screening for human papillomavirus (HPV): Secondary | ICD-10-CM | POA: Diagnosis present

## 2015-07-31 DIAGNOSIS — N858 Other specified noninflammatory disorders of uterus: Secondary | ICD-10-CM

## 2015-07-31 DIAGNOSIS — Z124 Encounter for screening for malignant neoplasm of cervix: Secondary | ICD-10-CM

## 2015-07-31 DIAGNOSIS — E108 Type 1 diabetes mellitus with unspecified complications: Secondary | ICD-10-CM | POA: Diagnosis not present

## 2015-07-31 DIAGNOSIS — I10 Essential (primary) hypertension: Secondary | ICD-10-CM | POA: Diagnosis not present

## 2015-07-31 DIAGNOSIS — E1065 Type 1 diabetes mellitus with hyperglycemia: Secondary | ICD-10-CM | POA: Diagnosis not present

## 2015-07-31 DIAGNOSIS — D259 Leiomyoma of uterus, unspecified: Secondary | ICD-10-CM

## 2015-07-31 DIAGNOSIS — N9489 Other specified conditions associated with female genital organs and menstrual cycle: Secondary | ICD-10-CM | POA: Diagnosis not present

## 2015-07-31 DIAGNOSIS — N852 Hypertrophy of uterus: Secondary | ICD-10-CM

## 2015-07-31 DIAGNOSIS — IMO0002 Reserved for concepts with insufficient information to code with codable children: Secondary | ICD-10-CM

## 2015-07-31 DIAGNOSIS — N1330 Unspecified hydronephrosis: Secondary | ICD-10-CM

## 2015-07-31 MED ORDER — INSULIN ASPART 100 UNIT/ML FLEXPEN: 10.0000 [IU] | PEN_INJECTOR | Freq: Three times a day (TID) | SUBCUTANEOUS | Status: DC

## 2015-07-31 NOTE — Progress Notes (Signed)
Subjective:    Patient ID: Katherine Patton, female    DOB: June 14, 1960. Patient is being seen in f/u for management of diabetes requested by Bradd Burner, FNP.  Past Medical History  Diagnosis Date  . Diabetes mellitus without complication ([place])   . Uterine mass    Past Surgical History  Procedure Laterality Date  . Cataract extraction     Social History   Social History  . Marital Status: Single    Spouse Name: N/A  . Number of Children: N/A  . Years of Education: N/A   Social History Main Topics  . Smoking status: Never Smoker   . Smokeless tobacco: Never Used  . Alcohol Use: No  . Drug Use: No  . Sexual Activity: Not Currently    Birth Control/ Protection: None   Other Topics Concern  . None   Social History Narrative   Outpatient Encounter Prescriptions as of 07/31/2015  Medication Sig  . acetaminophen (TYLENOL) 500 MG tablet Take 500 mg by mouth every 6 (six) hours as needed for headache.  . gabapentin (NEURONTIN) 300 MG capsule Take 300 mg by mouth 2 (two) times daily.  . insulin aspart (NOVOLOG) 100 UNIT/ML FlexPen Inject 10-16 Units into the skin 3 (three) times daily with meals.  . insulin detemir (LEVEMIR) 100 UNIT/ML injection Inject 44 Units into the skin at bedtime.  . [DISCONTINUED] insulin aspart (NOVOLOG) 100 UNIT/ML FlexPen Inject 8-14 Units into the skin 3 (three) times daily with meals.   No facility-administered encounter medications on file as of 07/31/2015.   ALLERGIES: No Known Allergies VACCINATION STATUS:  There is no immunization history on file for this patient.  Diabetes She presents for her initial diabetic visit. She has type 1 (She is administratively classified as type 1 diabetes for management purposes.) diabetes mellitus. Onset time: She was diagnosed at approximate age of 55 years. Her disease course has been improving. There are no hypoglycemic associated symptoms. Pertinent negatives for hypoglycemia include no confusion,  headaches, pallor or seizures. Associated symptoms include blurred vision, fatigue, polydipsia, polyuria and weight loss. Pertinent negatives for diabetes include no chest pain and no polyphagia. There are no hypoglycemic complications. Symptoms are improving. Diabetic complications include peripheral neuropathy. Risk factors for coronary artery disease include diabetes mellitus, sedentary lifestyle and hypertension. Current diabetic treatment includes insulin injections and oral agent (monotherapy) (She is on Levemir 30 units daily at bedtime and Apidra 5-11 units TIDAC.). She is compliant with treatment most of the time. Her weight is increasing steadily (She gained 10 pounds overall.). She is following a generally unhealthy diet. When asked about meal planning, she reported none. She has had a previous visit with a dietitian. She never participates in exercise. Her home blood glucose trend is decreasing steadily. Her breakfast blood glucose range is generally 140-180 mg/dl. Her lunch blood glucose range is generally 180-200 mg/dl. Her dinner blood glucose range is generally 180-200 mg/dl. Her overall blood glucose range is 180-200 mg/dl. An ACE inhibitor/angiotensin II receptor blocker is being taken. Eye exam is current.  Hypertension This is a chronic problem. The current episode started more than 1 year ago. Associated symptoms include blurred vision. Pertinent negatives include no chest pain, headaches, palpitations or shortness of breath. Risk factors for coronary artery disease include diabetes mellitus, dyslipidemia and sedentary lifestyle. Past treatments include ACE inhibitors and beta blockers.     Review of Systems  Constitutional: Positive for weight loss and fatigue. Negative for fever, chills and unexpected weight change.  HENT: Negative for trouble swallowing and voice change.   Eyes: Positive for blurred vision. Negative for visual disturbance.  Respiratory: Negative for cough, shortness  of breath and wheezing.   Cardiovascular: Negative for chest pain, palpitations and leg swelling.  Gastrointestinal: Negative for nausea, vomiting and diarrhea.  Endocrine: Positive for polydipsia and polyuria. Negative for cold intolerance, heat intolerance and polyphagia.  Musculoskeletal: Positive for gait problem. Negative for myalgias and arthralgias.  Skin: Negative for color change, pallor, rash and wound.  Neurological: Negative for seizures and headaches.  Psychiatric/Behavioral: Negative for suicidal ideas and confusion.    Objective:    There were no vitals taken for this visit.  Wt Readings from Last 3 Encounters:  07/31/15 99 lb 8 oz (45.133 kg)  07/24/15 98 lb 6.4 oz (44.634 kg)  07/10/15 96 lb (43.545 kg)    Physical Exam  Constitutional: She is oriented to person, place, and time.  Cachectic, chronically sick-looking  HENT:  Head: Normocephalic and atraumatic.  Eyes: EOM are normal.  Neck: Normal range of motion. Neck supple. No tracheal deviation present. No thyromegaly present.  Cardiovascular: Normal rate and regular rhythm.   Pulmonary/Chest: Effort normal and breath sounds normal.  Abdominal: Soft. Bowel sounds are normal. There is no tenderness. There is no guarding.  Musculoskeletal: She exhibits no edema.  She uses a cane to walk around. She has a global loss of skeletal muscle mass.  Neurological: She is alert and oriented to person, place, and time. She has normal reflexes. No cranial nerve deficit. Coordination normal.  Skin: Skin is warm and dry. No rash noted. No erythema. No pallor.  Psychiatric: She has a normal mood and affect. Judgment normal.    CMP     Component Value Date/Time   NA 137 11/21/2014 0900   K 3.7 11/21/2014 0900   CL 100* 11/21/2014 0900   CO2 24 11/21/2014 0900   GLUCOSE 313* 11/21/2014 0900   BUN 18 11/21/2014 0900   CREATININE 0.86 11/21/2014 0900   CALCIUM 9.5 11/21/2014 0900   PROT 8.0 11/21/2014 0900   ALBUMIN 4.3  11/21/2014 0900   AST 18 11/21/2014 0900   ALT 9* 11/21/2014 0900   ALKPHOS 49 11/21/2014 0900   BILITOT 0.7 11/21/2014 0900   GFRNONAA >60 11/21/2014 0900   GFRAA >60 11/21/2014 0900    Her A1c from 03/06/2015 was extremely high at 15.6%.   Assessment & Plan:    1. Uncontrolled type 1 diabetes mellitus with other specified complication [facility])  - Patient has currently uncontrolled symptomatic type 1 DM ( administrative reclassified as type I for management purposes) since  55 years of age,  with most recent A1c of 15.6 %.  -She Came with improved however still significantly above target blood glucose profile despite basal/bolus insulin started last visit a week ago.   - Her diabetes is complicated by neuropathy, and recent unexplained weight loss and patient remains at a high risk for more acute and chronic complications of diabetes which include CAD, CVA, CKD, retinopathy, and neuropathy. These are all discussed in detail with the patient.  - I have counseled the patient on diet management  by adopting a carbohydrate restricted/protein rich diet.   - I encouraged the patient to switch to  and consume more unprocessed or minimally processed complex starch and increased protein intake (animal or plant source), fruits, and vegetables.  - Patient is advised to stick to a routine mealtimes to eat 3 meals  a day and some  light snacks, since she has a big weight deficit.   - The patient will be scheduled with Jearld Fenton, RDN, CDE for individualized DM education.  - I have approached patient with the following individualized plan to manage diabetes and patient agrees:   - She will be treated exclusively with insulin -basal /bolus basal insulin.  - To facilitate this, I have approached her to stay engaged in strict monitoring of glucose  AC and HS. -I will readjust her basal insulin Levemir to 44 units daily at bedtime, increase prandial insulin Apidra to 10 units 3 times a day before  meals for pre-meal blood glucose above 90 mg/dL. She will also use some Apidra for sliding scale when pre-meal blood glucose is above 150 mg/dL. - Patient is warned not to take insulin without proper monitoring per orders. -Adjustment parameters are given for hypo and hyperglycemia in writing. -Patient is encouraged to call clinic for blood glucose levels less than 70 or above 300 mg /dl.  - She is not a candidate for metformin, SGLT2 inhibitors, nor  incretin therapy.   - Patient specific target  A1c;  LDL, HDL, Triglycerides, and  Waist Circumference were discussed in detail.  2) BP/HTN:  Controlled. Continue current medications including ACEI/ARB. 3) Lipids/HPL:  Uncontrolled, LDL at 111. She will be considered for statin therapy on subsequent visits. 4) uterine leiomyoma: Large at 24 week. She is currently being evaluated for possible hysterectomy by Dr. Glo Herring. -I advised her to follow up with the plan.  5)  Weight/Diet: CDE Consult will be initiated , exercise, and detailed carbohydrates information provided. - She has a history is significant for progressive weight loss of approximately 50 pounds over 2-3 years time. This is largely unexplained weight loss. She has a primary care at [facility]. Review of her records in the computer system reveals that she did have large fibroid based on CT scan done in 2004. However there was no subsequent CT scan. Abdominal ultrasound in October 2016 was unremarkable. However given unexplained significant weight loss in recent years, she would need repeat imaging study preferably with CT abdomen/pelvis. I will proceed to order this tests for her.  -If abdominal mass is still there, she may benefit from exploration by surgery.   6) Chronic Care/Health Maintenance:  -Patient is on ACEI/ARB medications and encouraged to continue to follow up with Ophthalmology, Podiatrist at least yearly or according to recommendations, and advised to    stay away from smoking. I have recommended yearly flu vaccine and pneumonia vaccination at least every 5 years; moderate intensity exercise for up to 150 minutes weekly; and  sleep for at least 7 hours a day.  - 25 minutes of time was spent on the care of this patient , 50% of which was applied for counseling on diabetes complications and their preventions.  - Patient to bring meter and  blood glucose logs during their next visit. She has orders for A1c, CMP in place by OB/GYN.   - I advised patient to maintain close follow up with House, Deliah Goody, FNP for primary care needs.  Follow up plan: - Return in about 4 weeks (around 08/28/2015) for follow up with pre-visit labs, meter, and logs.  Glade Lloyd, MD Phone: [phone]  Fax: [phone]   07/31/2015, 4:06 PM

## 2015-07-31 NOTE — Progress Notes (Signed)
Patient ID: Katherine Patton, female   DOB: [birth year], 55 y.o.   MRN: [id number] Patient ID: Katherine Patton, female   DOB: May 19, 1960, 55 y.o.   MRN: [id number]   [place] [facility] Visit  @DATE @            Patient name: Katherine Patton MRN [id number]  Date of birth: 08-Oct-1960  CC & HPI:  Katherine Patton is a 55 y.o. female presenting today for surgical consult. Pt was referred from [facility] for discussion of possible hysterectomy after CT A/P showed massive enlargement of the uterus, measuring 20 cm in craniocaudad dimension and extending superior to the umbilicus, with multiple round enhancing lesions. The large round enhancing lesions are characteristic of leiomyomas. She states she is not having abdominal pain or dysmenorrhea. Pt states she has not had a pap smear since 2005. She is not currently sexually active. NKDA.   ROS:  Review of Systems  Constitutional:       No complaints   Gastrointestinal: Negative for abdominal pain.  All other systems reviewed and are negative.  Pertinent History Reviewed:   Reviewed: Significant for DM on 10-16 units qd Novalog and 40 units hs Levemir.   Medical         Past Medical History  Diagnosis Date  . Diabetes mellitus without complication ([place])   . Uterine mass                               Surgical Hx:    Past Surgical History  Procedure Laterality Date  . Cataract extraction     Medications: Reviewed & Updated - see associated section                       Current outpatient prescriptions:  .  acetaminophen (TYLENOL) 500 MG tablet, Take 500 mg by mouth every 6 (six) hours as needed for headache., Disp: , Rfl:  .  gabapentin (NEURONTIN) 300 MG capsule, Take 300 mg by mouth 2 (two) times daily., Disp: , Rfl:  .  insulin aspart (NOVOLOG) 100 UNIT/ML FlexPen, Inject 8-14 Units into the skin 3 (three) times daily with meals., Disp: 15 mL, Rfl: 3 .  insulin detemir (LEVEMIR) 100 UNIT/ML injection, Inject 30 Units into the skin at bedtime.,  Disp: , Rfl:    Social History: Reviewed -  reports that she has never smoked. She has never used smokeless tobacco.  Objective Findings:  Vitals: Blood pressure 126/80, height 5' (1.524 m), weight 99 lb 8 oz (45.133 kg).  Physical Examination: General appearance - alert, well appearing, and in no distress Mental status - alert, oriented to person, place, and time Abdomen - soft, nontender, nondistended, no masses or organomegaly Pelvic -  VULVA: normal appearing vulva with no masses, tenderness or lesions,  VAGINA: normal appearing vagina with normal color and discharge, no lesions,  CERVIX: normal appearing cervix without discharge or lesions,  PAP: Pap smear done today Extremities - peripheral pulses normal, no pedal edema, no clubbing or cyanosis Skin - normal coloration and turgor, no rashes, no suspicious skin lesions noted   Endometrial Biopsy Procedure Note  Patient given informed consent, signed copy in the chart, time out was performed. Appropriate time out taken. . The patient was placed in the lithotomy position and the cervix brought into view with sterile speculum.  Portio of cervix cleansed x 2 with betadine swabs.  A tenaculum was placed in the  anterior lip of the cervix.  The uterus was sounded for depth of 15 cm. A pipelle was introduced to into the uterus, suction created,  and an endometrial sample was obtained. All equipment was removed and accounted for.  The patient tolerated the procedure well.    Patient given post procedure instructions. The patient will return in 2 weeks for results.    Discussed with pt risks and benefits of hysterectomy with vs without bilateral oophorectomy. Advised pt that surgery is recommended in her case as her uterine enlargement is causing a decrease in kidney function. At end of discussion, pt had opportunity to ask questions and has no further questions at this time.   Greater than 50% was spent in counseling and coordination of care  with the patient. Total time greater than: 25 minutes    Assessment & Plan:   A:  1. CT A/P showed massive enlargement of the uterus, measuring 20 cm, with multiple round enhancing lesions characteristic of leiomyomas  2. Pap smear, GC/CHL and endometrial biopsy collected today   P:  1. Order CBC, CMET, HGB A1C today  2. F/u in 2 weeks for results and further planning and discussion of surgery  3 given [facility] [employer] info sheet of fibroids , and [facility] brochure on hysterectomy   By signing my name below, I, Hansel Feinstein, attest that this documentation has been prepared under the direction and in the presence of Jonnie Kind, MD. Electronically Signed: Hansel Feinstein, ED Scribe. 07/31/2015. 2:45 PM.  I personally performed the services described in this documentation, which was SCRIBED in my presence. The recorded information has been reviewed and considered accurate. It has been edited as necessary during review. Jonnie Kind, MD

## 2015-07-31 NOTE — Progress Notes (Signed)
Patient ID: Katherine Patton, female   DOB: 1960/04/01, 55 y.o.   MRN: [id number]   [place] [facility] Visit  @DATE @            Patient name: Katherine Patton MRN [id number]  Date of birth: [birth year]  CC & HPI:  Katherine Patton is a 55 y.o. female presenting today for surgical consult. Pt was referred from [facility] for discussion of possible hysterectomy after CT A/P showed massive enlargement of the uterus, measuring 20 cm in craniocaudad dimension and extending superior to the umbilicus, with multiple round enhancing lesions. The large round enhancing lesions are characteristic of leiomyomas. She states she is not having abdominal pain or dysmenorrhea. Pt states she has not had a pap smear since 2005. She is not currently sexually active. NKDA.   ROS:  Review of Systems  Constitutional:       No complaints   Gastrointestinal: Negative for abdominal pain.  All other systems reviewed and are negative.  Pertinent History Reviewed:   Reviewed: Significant for DM on 10-16 units qd Novalog and 40 units hs Levemir.   Medical         Past Medical History  Diagnosis Date   Diabetes mellitus without complication ([place])    Uterine mass                               Surgical Hx:    Past Surgical History  Procedure Laterality Date   Cataract extraction     Medications: Reviewed & Updated - see associated section                       Current outpatient prescriptions:    acetaminophen (TYLENOL) 500 MG tablet, Take 500 mg by mouth every 6 (six) hours as needed for headache., Disp: , Rfl:    gabapentin (NEURONTIN) 300 MG capsule, Take 300 mg by mouth 2 (two) times daily., Disp: , Rfl:    insulin aspart (NOVOLOG) 100 UNIT/ML FlexPen, Inject 8-14 Units into the skin 3 (three) times daily with meals., Disp: 15 mL, Rfl: 3   insulin detemir (LEVEMIR) 100 UNIT/ML injection, Inject 30 Units into the skin at bedtime., Disp: , Rfl:    Social History: Reviewed -  reports that she has never smoked.  She has never used smokeless tobacco.  Objective Findings:  Vitals: Blood pressure 126/80, height 5' (1.524 m), weight 99 lb 8 oz (45.133 kg).  Physical Examination: General appearance - alert, well appearing, and in no distress Mental status - alert, oriented to person, place, and time Abdomen - soft, nontender, nondistended, no masses or organomegaly Pelvic -  VULVA: normal appearing vulva with no masses, tenderness or lesions,  VAGINA: normal appearing vagina with normal color and discharge, no lesions,  CERVIX: normal appearing cervix without discharge or lesions,  PAP: Pap smear done today Extremities - peripheral pulses normal, no pedal edema, no clubbing or cyanosis Skin - normal coloration and turgor, no rashes, no suspicious skin lesions noted   Endometrial Biopsy Procedure Note  Patient given informed consent, signed copy in the chart, time out was performed. Appropriate time out taken. . The patient was placed in the lithotomy position and the cervix brought into view with sterile speculum.  Portio of cervix cleansed x 2 with betadine swabs.  A tenaculum was placed in the anterior lip of the cervix.  The uterus was sounded for depth of 15 cm.  A pipelle was introduced to into the uterus, suction created,  and an endometrial sample was obtained. All equipment was removed and accounted for.  The patient tolerated the procedure well.    Patient given post procedure instructions. The patient will return in 2 weeks for results.    Discussed with pt risks and benefits of hysterectomy with vs without bilateral oophorectomy. Advised pt that surgery is recommended in her case as her uterine enlargement is causing a decrease in kidney function. At end of discussion, pt had opportunity to ask questions and has no further questions at this time.   Greater than 50% was spent in counseling and coordination of care with the patient. Total time greater than: 25 minutes    Assessment & Plan:    A:  1. CT A/P showed massive enlargement of the uterus, measuring 20 cm, with multiple round enhancing lesions characteristic of leiomyomas  2. Pap smear, GC/CHL and endometrial biopsy collected today   P:  1. Order CBC, CMET, HGB A1C today  2. F/u in 2 weeks for results and further discussion of surgery     By signing my name below, I, Hansel Feinstein, attest that this documentation has been prepared under the direction and in the presence of Jonnie Kind, MD. Electronically Signed: Hansel Feinstein, ED Scribe. 07/31/2015. 2:45 PM.  I personally performed the services described in this documentation, which was SCRIBED in my presence. The recorded information has been reviewed and considered accurate. It has been edited as necessary during review. Jonnie Kind, MD

## 2015-08-02 LAB — CYTOLOGY - PAP

## 2015-08-14 ENCOUNTER — Encounter: Payer: Self-pay | Admitting: Obstetrics and Gynecology

## 2015-08-14 ENCOUNTER — Ambulatory Visit (INDEPENDENT_AMBULATORY_CARE_PROVIDER_SITE_OTHER): Payer: Medicaid Other | Admitting: Obstetrics and Gynecology

## 2015-08-14 VITALS — BP 128/60 | Ht 60.0 in | Wt 99.0 lb

## 2015-08-14 DIAGNOSIS — N1330 Unspecified hydronephrosis: Secondary | ICD-10-CM

## 2015-08-14 NOTE — Progress Notes (Addendum)
Patient ID: Katherine Patton, female   DOB: 12-07-1960, 55 y.o.   MRN: [id number]    [place] [facility] Visit  @DATE @            Patient name: Katherine Patton MRN [id number]  Date of birth: 11-07-1960  CC & HPI:  Katherine Patton is a 55 y.o. female presenting today for f/u of further discussion of surgical management of uterine enlargement and fibroids. Ct A/P on 07/17/15 showed 20 cm massive enlargement of the uterus, with multiple round enhancing lesions characteristic of leiomyomas. Pap on 07/31/15 was negative. Endometrial biopsy on 07/31/15 showed minute fragments of atypical columnar mucosa.   Pt states she has not had a period in 2 months. She states she is not currently experiencing any abdominal pain.   ROS:  Review of Systems  Gastrointestinal: Negative for abdominal pain.  All other systems reviewed and are negative.   Pertinent History Reviewed:   Reviewed: Significant for DM, uterine mass Medical         Past Medical History:  Diagnosis Date   Diabetes mellitus without complication ([place])    Uterine mass                               Surgical Hx:    Past Surgical History:  Procedure Laterality Date   CATARACT EXTRACTION     Medications: Reviewed & Updated - see associated section                       Current Outpatient Prescriptions:    acetaminophen (TYLENOL) 500 MG tablet, Take 500 mg by mouth every 6 (six) hours as needed for headache., Disp: , Rfl:    gabapentin (NEURONTIN) 300 MG capsule, Take 300 mg by mouth 2 (two) times daily., Disp: , Rfl:    insulin aspart (NOVOLOG) 100 UNIT/ML FlexPen, Inject 10-16 Units into the skin 3 (three) times daily with meals., Disp: 15 mL, Rfl: 3   insulin detemir (LEVEMIR) 100 UNIT/ML injection, Inject 44 Units into the skin at bedtime., Disp: , Rfl:    Social History: Reviewed -  reports that she has never smoked. She has never used smokeless tobacco.  Objective Findings:  Vitals: Blood pressure 128/60, height 5'  (1.524 m), weight 99 lb (44.9 kg).  Physical Examination: General appearance - alert, well appearing, and in no distress Mental status - alert, oriented to person, place, and time Abdomen - soft, nontender, nondistended, no masses or organomegaly Musculoskeletal - no joint tenderness, deformity or swelling Extremities - peripheral pulses normal, no pedal edema, no clubbing or cyanosis Skin - normal coloration and turgor, no rashes, no suspicious skin lesions noted  Discussed with pt risks and benefits of hysterectomy. At end of discussion, pt had opportunity to ask questions and has no further questions at this time. Pt states she would like to wait to make a decision at this time.   Greater than 50% was spent in counseling and coordination of care with the patient. 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 CT A/P 07/17/15 showed massive enlargement of the uterus, measuring 20 cm, with multiple round enhancing lesions characteristic of leiomyomas  2. Pap  negative on 07/31/15 3. Endometrial biopsy on 07/31/15 showed minute fragments of atypical columnar mucosa    P:  1. Pt wishes to wait to make a decision at this time 2. F/u in 1 month for decision  3. Take vitamin and iron supplement      By signing my name below, I, Hansel Feinstein, attest that this documentation has been prepared under the direction and in the presence of Jonnie Kind, MD. Electronically Signed: Hansel Feinstein, ED Scribe. 08/14/15. 10:00 AM.  I personally performed the services described in this documentation, which was SCRIBED in my presence. The recorded information has been reviewed and considered accurate. It has been edited as necessary during review. Jonnie Kind, MD

## 2015-08-20 NOTE — Progress Notes (Signed)
Patient ID: Katherine Patton, female   DOB: February 10, 1960, 55 y.o.   MRN: [id number]    [facility] Visit  @DATE @            Patient name: Katherine Patton MRN [id number]  Date of birth: 05-24-1960  CC & HPI:  Katherine Patton is a 55 y.o. female presenting today for f/u of further discussion of surgical management of uterine enlargement and fibroids. Ct A/P on 07/17/15 showed 20 cm massive enlargement of the uterus, with multiple round enhancing lesions characteristic of leiomyomas. Pap on 07/31/15 was negative. Endometrial biopsy on 07/31/15 showed minute fragments of atypical columnar mucosa.   Pt states she has not had a period in 2 months. She states she is not currently experiencing any abdominal pain.   ROS:  Review of Systems  Gastrointestinal: Negative for abdominal pain.  All other systems reviewed and are negative.   Pertinent History Reviewed:   Reviewed: Significant for DM, uterine mass Medical         Past Medical History:  Diagnosis Date  . Diabetes mellitus without complication ([place])   . Uterine mass                               Surgical Hx:    Past Surgical History:  Procedure Laterality Date  . CATARACT EXTRACTION     Medications: Reviewed & Updated - see associated section                       Current Outpatient Prescriptions:  .  acetaminophen (TYLENOL) 500 MG tablet, Take 500 mg by mouth every 6 (six) hours as needed for headache., Disp: , Rfl:  .  gabapentin (NEURONTIN) 300 MG capsule, Take 300 mg by mouth 2 (two) times daily., Disp: , Rfl:  .  insulin aspart (NOVOLOG) 100 UNIT/ML FlexPen, Inject 10-16 Units into the skin 3 (three) times daily with meals., Disp: 15 mL, Rfl: 3 .  insulin detemir (LEVEMIR) 100 UNIT/ML injection, Inject 44 Units into the skin at bedtime., Disp: , Rfl:    Social History: Reviewed -  reports that she has never smoked. She has never used smokeless tobacco.  Objective Findings:  Vitals: Blood pressure 128/60, height 5'  (1.524 m), weight 99 lb (44.9 kg).  Physical Examination: General appearance - alert, well appearing, and in no distress Mental status - alert, oriented to person, place, and time Abdomen - soft, nontender, nondistended, no masses or organomegaly Musculoskeletal - no joint tenderness, deformity or swelling Extremities - peripheral pulses normal, no pedal edema, no clubbing or cyanosis Skin - normal coloration and turgor, no rashes, no suspicious skin lesions noted  Discussed with pt risks and benefits of hysterectomy. At end of discussion, pt had opportunity to ask questions and has no further questions at this time. Pt states she would like to wait to make a decision at this time.   Greater than 50% was spent in counseling and coordination of care with the patient. 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 CT A/P 07/17/15 showed massive enlargement of the uterus, measuring 20 cm, with multiple round enhancing lesions characteristic of leiomyomas  2. Pap  negative on 07/31/15 3. Endometrial biopsy on 07/31/15 showed minute fragments of atypical columnar mucosa    P:  1. Pt wishes to wait to make a decision at this time 2. F/u in 1 month for decision  3. Take vitamin and iron supplement      By signing my name below, I, Hansel Feinstein, attest that this documentation has been prepared under the direction and in the presence of Jonnie Kind, MD. Electronically Signed: Hansel Feinstein, ED Scribe. 08/14/15. 10:00 AM.  I personally performed the services described in this documentation, which was SCRIBED in my presence. The recorded information has been reviewed and considered accurate. It has been edited as necessary during review. Jonnie Kind, MD

## 2015-08-28 ENCOUNTER — Ambulatory Visit: Payer: Medicaid Other | Admitting: Nutrition

## 2015-09-18 ENCOUNTER — Ambulatory Visit: Payer: Medicaid Other | Admitting: "Endocrinology

## 2015-09-25 ENCOUNTER — Encounter: Payer: Self-pay | Admitting: *Deleted

## 2015-09-25 ENCOUNTER — Ambulatory Visit: Payer: Medicaid Other | Admitting: Obstetrics and Gynecology

## 2015-10-02 ENCOUNTER — Ambulatory Visit: Payer: Medicaid Other | Admitting: "Endocrinology

## 2015-10-16 ENCOUNTER — Ambulatory Visit: Payer: Medicaid Other | Admitting: "Endocrinology

## 2015-10-17 LAB — CBC WITH DIFFERENTIAL/PLATELET
Basophils Absolute: 0 10*3/uL (ref 0.0–0.2)
Basos: 0 %
EOS (ABSOLUTE): 0.5 10*3/uL — AB (ref 0.0–0.4)
Eos: 8 %
Hematocrit: 43.5 % (ref 34.0–46.6)
Hemoglobin: 14.3 g/dL (ref 11.1–15.9)
IMMATURE GRANULOCYTES: 0 %
Immature Grans (Abs): 0 10*3/uL (ref 0.0–0.1)
Lymphocytes Absolute: 1.7 10*3/uL (ref 0.7–3.1)
Lymphs: 26 %
MCH: 27.1 pg (ref 26.6–33.0)
MCHC: 32.9 g/dL (ref 31.5–35.7)
MCV: 83 fL (ref 79–97)
MONOS ABS: 0.4 10*3/uL (ref 0.1–0.9)
Monocytes: 6 %
NEUTROS PCT: 60 %
Neutrophils Absolute: 4 10*3/uL (ref 1.4–7.0)
PLATELETS: 183 10*3/uL (ref 150–379)
RBC: 5.27 x10E6/uL (ref 3.77–5.28)
RDW: 14.3 % (ref 12.3–15.4)
WBC: 6.7 10*3/uL (ref 3.4–10.8)

## 2015-10-17 LAB — COMPREHENSIVE METABOLIC PANEL
ALT: 8 IU/L (ref 0–32)
AST: 13 IU/L (ref 0–40)
Albumin/Globulin Ratio: 1.3 (ref 1.2–2.2)
Albumin: 4.2 g/dL (ref 3.5–5.5)
Alkaline Phosphatase: 70 IU/L (ref 39–117)
BUN / CREAT RATIO: 19 (ref 9–23)
BUN: 18 mg/dL (ref 6–24)
Bilirubin Total: 0.4 mg/dL (ref 0.0–1.2)
CALCIUM: 9.7 mg/dL (ref 8.7–10.2)
CHLORIDE: 95 mmol/L — AB (ref 96–106)
CO2: 26 mmol/L (ref 18–29)
Creatinine, Ser: 0.97 mg/dL (ref 0.57–1.00)
GFR calc Af Amer: 78 mL/min/{1.73_m2} (ref >59)
GFR calc non Af Amer: 67 mL/min/{1.73_m2} (ref >59)
Globulin, Total: 3.3 g/dL (ref 1.5–4.5)
Glucose: 398 mg/dL — ABNORMAL HIGH (ref 65–99)
POTASSIUM: 3.8 mmol/L (ref 3.5–5.2)
SODIUM: 137 mmol/L (ref 134–144)
Total Protein: 7.5 g/dL (ref 6.0–8.5)

## 2015-10-17 LAB — HEMOGLOBIN A1C
Est. average glucose Bld gHb Est-mCnc: 275 mg/dL
HEMOGLOBIN A1C: 11.2 % — AB (ref 4.8–5.6)

## 2015-10-20 ENCOUNTER — Telehealth: Payer: Self-pay | Admitting: Obstetrics and Gynecology

## 2015-10-20 NOTE — Telephone Encounter (Signed)
Hgb A1C 11.3,  Unable to reach pt by phone.

## 2015-10-23 ENCOUNTER — Ambulatory Visit (INDEPENDENT_AMBULATORY_CARE_PROVIDER_SITE_OTHER): Payer: Medicaid Other | Admitting: "Endocrinology

## 2015-10-23 ENCOUNTER — Encounter: Payer: Self-pay | Admitting: "Endocrinology

## 2015-10-23 VITALS — BP 140/88 | HR 96 | Ht 60.0 in | Wt 102.0 lb

## 2015-10-23 DIAGNOSIS — E1069 Type 1 diabetes mellitus with other specified complication: Secondary | ICD-10-CM

## 2015-10-23 DIAGNOSIS — E1065 Type 1 diabetes mellitus with hyperglycemia: Secondary | ICD-10-CM | POA: Diagnosis not present

## 2015-10-23 DIAGNOSIS — IMO0002 Reserved for concepts with insufficient information to code with codable children: Secondary | ICD-10-CM

## 2015-10-23 DIAGNOSIS — Z9119 Patient's noncompliance with other medical treatment and regimen: Secondary | ICD-10-CM

## 2015-10-23 DIAGNOSIS — I10 Essential (primary) hypertension: Secondary | ICD-10-CM

## 2015-10-23 DIAGNOSIS — Z91199 Patient's noncompliance with other medical treatment and regimen due to unspecified reason: Secondary | ICD-10-CM

## 2015-10-23 MED ORDER — INSULIN ASPART 100 UNIT/ML FLEXPEN: 15.0000 [IU] | PEN_INJECTOR | Freq: Three times a day (TID) | SUBCUTANEOUS | 3 refills | Status: DC

## 2015-10-23 NOTE — Progress Notes (Signed)
Subjective:    Patient ID: Katherine Patton, female    DOB: 09-21-1960. Patient is being seen in f/u for management of diabetes requested by Bradd Burner, FNP.  Past Medical History:  Diagnosis Date  . Diabetes mellitus without complication ([place])   . Uterine mass    Past Surgical History:  Procedure Laterality Date  . CATARACT EXTRACTION     Social History   Social History  . Marital status: Single    Spouse name: N/A  . Number of children: N/A  . Years of education: N/A   Social History Main Topics  . Smoking status: Never Smoker  . Smokeless tobacco: Never Used  . Alcohol use No  . Drug use: No  . Sexual activity: Not Currently    Birth control/ protection: None   Other Topics Concern  . None   Social History Narrative  . None   Outpatient Encounter Prescriptions as of 10/23/2015  Medication Sig  . acetaminophen (TYLENOL) 500 MG tablet Take 500 mg by mouth every 6 (six) hours as needed for headache.  . gabapentin (NEURONTIN) 300 MG capsule Take 300 mg by mouth 2 (two) times daily.  . insulin aspart (NOVOLOG) 100 UNIT/ML FlexPen Inject 10-16 Units into the skin 3 (three) times daily with meals.  . insulin detemir (LEVEMIR) 100 UNIT/ML injection Inject 50 Units into the skin at bedtime.   No facility-administered encounter medications on file as of 10/23/2015.    ALLERGIES: No Known Allergies VACCINATION STATUS:  There is no immunization history on file for this patient.  Diabetes  She presents for her initial diabetic visit. She has type 1 (She is administratively classified as type 1 diabetes for management purposes.) diabetes mellitus. Onset time: She was diagnosed at approximate age of 47 years. Her disease course has been improving. There are no hypoglycemic associated symptoms. Pertinent negatives for hypoglycemia include no confusion, headaches, pallor or seizures. Associated symptoms include blurred vision, fatigue, polydipsia, polyuria and weight loss.  Pertinent negatives for diabetes include no chest pain and no polyphagia. There are no hypoglycemic complications. Symptoms are improving. Diabetic complications include peripheral neuropathy. Risk factors for coronary artery disease include diabetes mellitus, sedentary lifestyle and hypertension. Current diabetic treatment includes insulin injections and oral agent (monotherapy) (She is on Levemir 30 units daily at bedtime and Apidra 5-11 units TIDAC.). She is compliant with treatment most of the time. Her weight is increasing steadily (She gained 10 pounds overall.). She is following a generally unhealthy diet. When asked about meal planning, she reported none. She has had a previous visit with a dietitian. She never participates in exercise. Her home blood glucose trend is decreasing steadily. Her breakfast blood glucose range is generally >200 mg/dl. Her lunch blood glucose range is generally >200 mg/dl. Her dinner blood glucose range is generally >200 mg/dl. Her overall blood glucose range is >200 mg/dl. An ACE inhibitor/angiotensin II receptor blocker is being taken. Eye exam is current.  Hypertension  This is a chronic problem. The current episode started more than 1 year ago. Associated symptoms include blurred vision. Pertinent negatives include no chest pain, headaches, palpitations or shortness of breath. Risk factors for coronary artery disease include diabetes mellitus, dyslipidemia and sedentary lifestyle. Past treatments include ACE inhibitors and beta blockers.     Review of Systems  Constitutional: Positive for fatigue and weight loss. Negative for chills, fever and unexpected weight change.  HENT: Negative for trouble swallowing and voice change.   Eyes: Positive for blurred vision.  Negative for visual disturbance.  Respiratory: Negative for cough, shortness of breath and wheezing.   Cardiovascular: Negative for chest pain, palpitations and leg swelling.  Gastrointestinal: Negative for  diarrhea, nausea and vomiting.  Endocrine: Positive for polydipsia and polyuria. Negative for cold intolerance, heat intolerance and polyphagia.  Musculoskeletal: Positive for gait problem. Negative for arthralgias and myalgias.  Skin: Negative for color change, pallor, rash and wound.  Neurological: Negative for seizures and headaches.  Psychiatric/Behavioral: Negative for confusion and suicidal ideas.    Objective:    BP 140/88   Pulse 96   Ht 5' (1.524 m)   Wt 102 lb (46.3 kg)   BMI 19.92 kg/m   Wt Readings from Last 3 Encounters:  10/23/15 102 lb (46.3 kg)  08/14/15 99 lb (44.9 kg)  07/31/15 99 lb 8 oz (45.1 kg)    Physical Exam  Constitutional: She is oriented to person, place, and time.  Cachectic, chronically sick-looking  HENT:  Head: Normocephalic and atraumatic.  Eyes: EOM are normal.  Neck: Normal range of motion. Neck supple. No tracheal deviation present. No thyromegaly present.  Cardiovascular: Normal rate and regular rhythm.   Pulmonary/Chest: Effort normal and breath sounds normal.  Abdominal: Soft. Bowel sounds are normal. There is no tenderness. There is no guarding.  Musculoskeletal: She exhibits no edema.  She uses a cane to walk around. She has a global loss of skeletal muscle mass.  Neurological: She is alert and oriented to person, place, and time. She has normal reflexes. No cranial nerve deficit. Coordination normal.  Skin: Skin is warm and dry. No rash noted. No erythema. No pallor.  Psychiatric: She has a normal mood and affect. Judgment normal.    CMP     Component Value Date/Time   NA 137 10/16/2015 0849   K 3.8 10/16/2015 0849   CL 95 (L) 10/16/2015 0849   CO2 26 10/16/2015 0849   GLUCOSE 398 (H) 10/16/2015 0849   GLUCOSE 313 (H) 11/21/2014 0900   BUN 18 10/16/2015 0849   CREATININE 0.97 10/16/2015 0849   CALCIUM 9.7 10/16/2015 0849   PROT 7.5 10/16/2015 0849   ALBUMIN 4.2 10/16/2015 0849   AST 13 10/16/2015 0849   ALT 8 10/16/2015  0849   ALKPHOS 70 10/16/2015 0849   BILITOT 0.4 10/16/2015 0849   GFRNONAA 67 10/16/2015 0849   GFRAA 78 10/16/2015 0849    Her A1c from 03/06/2015 was extremely high at 15.6%.   Assessment & Plan:    1. Uncontrolled type 1 diabetes mellitus with other specified complication [facility])  - Patient has currently uncontrolled symptomatic type 1 DM ( administrative reclassified as type I for management purposes) since  55 years of age. - She missed her prior appointments. She came with slight improvement in her A1c to 11.2% from 15.6%.  -She still has significantly above target blood glucose profile despite basal/bolus insulin started last visit a week ago.   - Her diabetes is complicated by neuropathy, and recent unexplained weight loss and patient remains at a high risk for more acute and chronic complications of diabetes which include CAD, CVA, CKD, retinopathy, and neuropathy. These are all discussed in detail with the patient.  - I have counseled the patient on diet management  by adopting a carbohydrate restricted/protein rich diet.   - I encouraged the patient to switch to  and consume more unprocessed or minimally processed complex starch and increased protein intake (animal or plant source), fruits, and vegetables.  - Patient is  advised to stick to a routine mealtimes to eat 3 meals  a day and some light snacks, since she has a big weight deficit.   - The patient will be scheduled with Jearld Fenton, RDN, CDE for individualized DM education.  - I have approached patient with the following individualized plan to manage diabetes and patient agrees:   - She will be treated exclusively with insulin -basal /bolus basal insulin.  -  I have approached her to stay engaged in strict monitoring of glucose  AC and HS. -I will readjust her basal insulin Levemir to 50 units daily at bedtime, increase prandial insulin Novolog to 15 units 3 times a day before meals for pre-meal blood glucose  above 90 mg/dL.  - Patient is warned not to take insulin without proper monitoring per orders. -Adjustment parameters are given for hypo and hyperglycemia in writing. -Patient is encouraged to call clinic for blood glucose levels less than 70 or above 300 mg /dl.  - She is not a candidate for metformin, SGLT2 inhibitors, nor  incretin therapy.   - Patient specific target  A1c;  LDL, HDL, Triglycerides, and  Waist Circumference were discussed in detail.  2) BP/HTN:  Controlled. Continue current medications including ACEI/ARB. 3) Lipids/HPL:  Uncontrolled, LDL at 111. She will be considered for statin therapy on subsequent visits. 4) uterine leiomyoma: Large at 24 week. She is currently being evaluated for possible hysterectomy by Dr. Glo Herring. -I advised her to follow up with the plan.  5)  Weight/Diet: CDE Consult will be initiated , exercise, and detailed carbohydrates information provided. - She has a history is significant for progressive weight loss of approximately 50 pounds over 2-3 years time. This is largely unexplained weight loss. She has a primary care at [facility]. Review of her records in the computer system reveals that she did have large fibroid based on CT scan done in 2004. However there was no subsequent CT scan. Abdominal ultrasound in October 2016 was unremarkable. However given unexplained significant weight loss in recent years, she would need repeat imaging study preferably with CT abdomen/pelvis. I will proceed to order this tests for her.  -If abdominal mass is still there, she may benefit from exploration by surgery.   6) Chronic Care/Health Maintenance:  -Patient is on ACEI/ARB medications and encouraged to continue to follow up with Ophthalmology, Podiatrist at least yearly or according to recommendations, and advised to   stay away from smoking. I have recommended yearly flu vaccine and pneumonia vaccination at least every 5 years; moderate  intensity exercise for up to 150 minutes weekly; and  sleep for at least 7 hours a day.  - 25 minutes of time was spent on the care of this patient , 50% of which was applied for counseling on diabetes complications and their preventions.  - Patient to bring meter and  blood glucose logs during their next visit. She has orders for A1c, CMP in place by OB/GYN.   - I advised patient to maintain close follow up with House, Deliah Goody, FNP for primary care needs.  Follow up plan: - Return in about 2 weeks (around 11/06/2015) for follow up with meter and logs- no labs.  Glade Lloyd, MD Phone: [phone]  Fax: [phone]   10/23/2015, 1:55 PM

## 2015-10-30 ENCOUNTER — Encounter: Payer: Self-pay | Admitting: "Endocrinology

## 2015-10-30 ENCOUNTER — Ambulatory Visit (INDEPENDENT_AMBULATORY_CARE_PROVIDER_SITE_OTHER): Payer: Medicaid Other | Admitting: "Endocrinology

## 2015-10-30 VITALS — BP 132/84 | HR 102 | Ht 60.0 in | Wt 104.0 lb

## 2015-10-30 DIAGNOSIS — Z9119 Patient's noncompliance with other medical treatment and regimen: Secondary | ICD-10-CM | POA: Diagnosis not present

## 2015-10-30 DIAGNOSIS — E1065 Type 1 diabetes mellitus with hyperglycemia: Secondary | ICD-10-CM | POA: Diagnosis not present

## 2015-10-30 DIAGNOSIS — I10 Essential (primary) hypertension: Secondary | ICD-10-CM

## 2015-10-30 DIAGNOSIS — E1069 Type 1 diabetes mellitus with other specified complication: Secondary | ICD-10-CM | POA: Diagnosis not present

## 2015-10-30 DIAGNOSIS — Z91199 Patient's noncompliance with other medical treatment and regimen due to unspecified reason: Secondary | ICD-10-CM

## 2015-10-30 DIAGNOSIS — IMO0002 Reserved for concepts with insufficient information to code with codable children: Secondary | ICD-10-CM

## 2015-10-30 NOTE — Progress Notes (Signed)
Subjective:    Patient ID: Katherine Patton, female    DOB: Jul 16, 1960. Patient is being seen in f/u for management of diabetes requested by Bradd Burner, FNP.  Past Medical History:  Diagnosis Date  . Diabetes mellitus without complication ([place])   . Uterine mass    Past Surgical History:  Procedure Laterality Date  . CATARACT EXTRACTION     Social History   Social History  . Marital status: Single    Spouse name: N/A  . Number of children: N/A  . Years of education: N/A   Social History Main Topics  . Smoking status: Never Smoker  . Smokeless tobacco: Never Used  . Alcohol use No  . Drug use: No  . Sexual activity: Not Currently    Birth control/ protection: None   Other Topics Concern  . None   Social History Narrative  . None   Outpatient Encounter Prescriptions as of 10/30/2015  Medication Sig  . acetaminophen (TYLENOL) 500 MG tablet Take 500 mg by mouth every 6 (six) hours as needed for headache.  . gabapentin (NEURONTIN) 300 MG capsule Take 300 mg by mouth 2 (two) times daily.  . insulin aspart (NOVOLOG) 100 UNIT/ML FlexPen Inject 15-21 Units into the skin 3 (three) times daily with meals.  . insulin detemir (LEVEMIR) 100 UNIT/ML injection Inject 60 Units into the skin at bedtime.   No facility-administered encounter medications on file as of 10/30/2015.    ALLERGIES: No Known Allergies VACCINATION STATUS:  There is no immunization history on file for this patient.  Diabetes  She presents for her follow-up diabetic visit. She has type 1 (She is administratively classified as type 1 diabetes for management purposes.) diabetes mellitus. Onset time: She was diagnosed at approximate age of 26 years. Her disease course has been improving. There are no hypoglycemic associated symptoms. Pertinent negatives for hypoglycemia include no confusion, headaches, pallor or seizures. Associated symptoms include blurred vision and weight loss. Pertinent negatives for  diabetes include no chest pain, no fatigue, no polydipsia, no polyphagia and no polyuria. There are no hypoglycemic complications. Symptoms are improving. Diabetic complications include peripheral neuropathy. Risk factors for coronary artery disease include diabetes mellitus, sedentary lifestyle and hypertension. Current diabetic treatment includes insulin injections. She is compliant with treatment most of the time. Her weight is increasing steadily. She is following a generally unhealthy diet. When asked about meal planning, she reported none. She has had a previous visit with a dietitian. She never participates in exercise. Her home blood glucose trend is decreasing steadily. Her breakfast blood glucose range is generally >200 mg/dl. Her lunch blood glucose range is generally >200 mg/dl. Her dinner blood glucose range is generally >200 mg/dl. Her overall blood glucose range is >200 mg/dl. An ACE inhibitor/angiotensin II receptor blocker is being taken. Eye exam is current.  Hypertension  This is a chronic problem. The current episode started more than 1 year ago. Associated symptoms include blurred vision. Pertinent negatives include no chest pain, headaches, palpitations or shortness of breath. Risk factors for coronary artery disease include diabetes mellitus, dyslipidemia and sedentary lifestyle. Past treatments include ACE inhibitors and beta blockers.     Review of Systems  Constitutional: Positive for weight loss. Negative for chills, fatigue, fever and unexpected weight change.  HENT: Negative for trouble swallowing and voice change.   Eyes: Positive for blurred vision. Negative for visual disturbance.  Respiratory: Negative for cough, shortness of breath and wheezing.   Cardiovascular: Negative for chest pain,  palpitations and leg swelling.  Gastrointestinal: Negative for diarrhea, nausea and vomiting.  Endocrine: Negative for cold intolerance, heat intolerance, polydipsia, polyphagia and  polyuria.  Musculoskeletal: Positive for gait problem. Negative for arthralgias and myalgias.  Skin: Negative for color change, pallor, rash and wound.  Neurological: Negative for seizures and headaches.  Psychiatric/Behavioral: Negative for confusion and suicidal ideas.    Objective:    BP 132/84   Pulse (!) 102   Ht 5' (1.524 m)   Wt 104 lb (47.2 kg)   BMI 20.31 kg/m   Wt Readings from Last 3 Encounters:  10/30/15 104 lb (47.2 kg)  10/23/15 102 lb (46.3 kg)  08/14/15 99 lb (44.9 kg)    Physical Exam  Constitutional: She is oriented to person, place, and time.  Cachectic, chronically sick-looking  HENT:  Head: Normocephalic and atraumatic.  Eyes: EOM are normal.  Neck: Normal range of motion. Neck supple. No tracheal deviation present. No thyromegaly present.  Cardiovascular: Normal rate and regular rhythm.   Pulmonary/Chest: Effort normal and breath sounds normal.  Abdominal: Soft. Bowel sounds are normal. There is no tenderness. There is no guarding.  Musculoskeletal: She exhibits no edema.  She uses a cane to walk around. She has a global loss of skeletal muscle mass.  Neurological: She is alert and oriented to person, place, and time. She has normal reflexes. No cranial nerve deficit. Coordination normal.  Skin: Skin is warm and dry. No rash noted. No erythema. No pallor.  Psychiatric: She has a normal mood and affect. Judgment normal.    CMP     Component Value Date/Time   NA 137 10/16/2015 0849   K 3.8 10/16/2015 0849   CL 95 (L) 10/16/2015 0849   CO2 26 10/16/2015 0849   GLUCOSE 398 (H) 10/16/2015 0849   GLUCOSE 313 (H) 11/21/2014 0900   BUN 18 10/16/2015 0849   CREATININE 0.97 10/16/2015 0849   CALCIUM 9.7 10/16/2015 0849   PROT 7.5 10/16/2015 0849   ALBUMIN 4.2 10/16/2015 0849   AST 13 10/16/2015 0849   ALT 8 10/16/2015 0849   ALKPHOS 70 10/16/2015 0849   BILITOT 0.4 10/16/2015 0849   GFRNONAA 67 10/16/2015 0849   GFRAA 78 10/16/2015 0849    Her A1c  from 03/06/2015 was extremely high at 15.6%.   Assessment & Plan:    1. Uncontrolled type 1 diabetes mellitus with other specified complication [facility])  - Patient has currently uncontrolled symptomatic type 1 DM ( administrative reclassified as type I for management purposes) since  55 years of age. - She missed her prior appointments. She came with slight improvement in her A1c to 11.2% from 15.6%.  -She still has significantly above target blood glucose profile despite basal/bolus insulin started last visit a week ago.   - Her diabetes is complicated by neuropathy, and recent unexplained weight loss and patient remains at a high risk for more acute and chronic complications of diabetes which include CAD, CVA, CKD, retinopathy, and neuropathy. These are all discussed in detail with the patient.  - I have counseled the patient on diet management  by adopting a carbohydrate restricted/protein rich diet.   - I encouraged the patient to switch to  and consume more unprocessed or minimally processed complex starch and increased protein intake (animal or plant source), fruits, and vegetables.  - Patient is advised to stick to a routine mealtimes to eat 3 meals  a day and some light snacks, since she has a big weight deficit.   -  The patient will be scheduled with Jearld Fenton, RDN, CDE for individualized DM education.  - I have approached patient with the following individualized plan to manage diabetes and patient agrees:   - She will be treated exclusively with insulin -basal /bolus basal insulin.  -  I have approached her to stay engaged in strict monitoring of glucose  AC and HS. -I will readjust her basal insulin Levemir to 60 units daily at bedtime, continue prandial insulin Novolog  15 units 3 times a day before meals for pre-meal blood glucose above 90 mg/dL.  - Patient is warned not to take insulin without proper monitoring per orders. -Adjustment parameters are given for hypo and  hyperglycemia in writing. -Patient is encouraged to call clinic for blood glucose levels less than 70 or above 300 mg /dl.  - She is not a candidate for metformin, SGLT2 inhibitors, nor  incretin therapy.   - Patient specific target  A1c;  LDL, HDL, Triglycerides, and  Waist Circumference were discussed in detail.  2) BP/HTN:  Controlled. Continue current medications including ACEI/ARB. 3) Lipids/HPL:  Uncontrolled, LDL at 111. She will be considered for statin therapy on subsequent visits. 4) uterine leiomyoma: Large at 24 week. She is currently being evaluated for possible hysterectomy by Dr. Glo Herring. -I advised her to follow up with the plan.  5)  Weight/Diet: CDE Consult in progress , exercise, and detailed carbohydrates information provided..  - She is progressively regaining her weight.  6) Chronic Care/Health Maintenance:  -Patient is on ACEI/ARB medications and encouraged to continue to follow up with Ophthalmology, Podiatrist at least yearly or according to recommendations, and advised to   stay away from smoking. I have recommended yearly flu vaccine and pneumonia vaccination at least every 5 years; moderate intensity exercise for up to 150 minutes weekly; and  sleep for at least 7 hours a day.  - 25 minutes of time was spent on the care of this patient , 50% of which was applied for counseling on diabetes complications and their preventions.  - Patient to bring meter and  blood glucose logs during their next visit. She has orders for A1c, CMP in place by OB/GYN.   - I advised patient to maintain close follow up with House, Deliah Goody, FNP for primary care needs.  Follow up plan: - Return in about 2 weeks (around 11/13/2015) for follow up with meter and logs- no labs.  Glade Lloyd, MD Phone: [phone]  Fax: [phone]   10/30/2015, 1:58 PM

## 2015-11-13 ENCOUNTER — Encounter: Payer: Self-pay | Admitting: "Endocrinology

## 2015-11-13 ENCOUNTER — Encounter: Payer: Medicaid Other | Attending: "Endocrinology | Admitting: Nutrition

## 2015-11-13 ENCOUNTER — Ambulatory Visit (INDEPENDENT_AMBULATORY_CARE_PROVIDER_SITE_OTHER): Payer: Medicaid Other | Admitting: "Endocrinology

## 2015-11-13 VITALS — BP 136/74 | HR 106 | Resp 18 | Ht 60.0 in | Wt 106.0 lb

## 2015-11-13 VITALS — Wt 104.0 lb

## 2015-11-13 DIAGNOSIS — E1065 Type 1 diabetes mellitus with hyperglycemia: Secondary | ICD-10-CM | POA: Diagnosis not present

## 2015-11-13 DIAGNOSIS — E1069 Type 1 diabetes mellitus with other specified complication: Secondary | ICD-10-CM

## 2015-11-13 DIAGNOSIS — IMO0002 Reserved for concepts with insufficient information to code with codable children: Secondary | ICD-10-CM

## 2015-11-13 DIAGNOSIS — Z029 Encounter for administrative examinations, unspecified: Secondary | ICD-10-CM | POA: Diagnosis present

## 2015-11-13 DIAGNOSIS — Z91199 Patient's noncompliance with other medical treatment and regimen due to unspecified reason: Secondary | ICD-10-CM

## 2015-11-13 DIAGNOSIS — E440 Moderate protein-calorie malnutrition: Secondary | ICD-10-CM

## 2015-11-13 DIAGNOSIS — E108 Type 1 diabetes mellitus with unspecified complications: Secondary | ICD-10-CM

## 2015-11-13 DIAGNOSIS — Z9119 Patient's noncompliance with other medical treatment and regimen: Secondary | ICD-10-CM | POA: Diagnosis not present

## 2015-11-13 DIAGNOSIS — I10 Essential (primary) hypertension: Secondary | ICD-10-CM | POA: Diagnosis not present

## 2015-11-13 MED ORDER — INSULIN ASPART 100 UNIT/ML FLEXPEN: 18.0000 [IU] | PEN_INJECTOR | Freq: Three times a day (TID) | SUBCUTANEOUS | 3 refills | Status: DC

## 2015-11-13 NOTE — Progress Notes (Signed)
Subjective:    Patient ID: Katherine Patton, female    DOB: 02/07/1960. Patient is being seen in f/u for management of diabetes requested by Bradd Burner, FNP.  Past Medical History:  Diagnosis Date  . Diabetes mellitus without complication ([place])   . Uterine mass    Past Surgical History:  Procedure Laterality Date  . CATARACT EXTRACTION     Social History   Social History  . Marital status: Single    Spouse name: N/A  . Number of children: N/A  . Years of education: N/A   Social History Main Topics  . Smoking status: Never Smoker  . Smokeless tobacco: Never Used  . Alcohol use No  . Drug use: No  . Sexual activity: Not Currently    Birth control/ protection: None   Other Topics Concern  . None   Social History Narrative  . None   Outpatient Encounter Prescriptions as of 11/13/2015  Medication Sig  . acetaminophen (TYLENOL) 500 MG tablet Take 500 mg by mouth every 6 (six) hours as needed for headache.  . gabapentin (NEURONTIN) 300 MG capsule Take 300 mg by mouth 2 (two) times daily.  . insulin aspart (NOVOLOG) 100 UNIT/ML FlexPen Inject 18-24 Units into the skin 3 (three) times daily with meals.  . insulin detemir (LEVEMIR) 100 UNIT/ML injection Inject 60 Units into the skin at bedtime.  . [DISCONTINUED] insulin aspart (NOVOLOG) 100 UNIT/ML FlexPen Inject 15-21 Units into the skin 3 (three) times daily with meals.   No facility-administered encounter medications on file as of 11/13/2015.    ALLERGIES: No Known Allergies VACCINATION STATUS:  There is no immunization history on file for this patient.  Diabetes  She presents for her follow-up diabetic visit. She has type 1 (She is administratively classified as type 1 diabetes for management purposes.) diabetes mellitus. Onset time: She was diagnosed at approximate age of 55 years. Her disease course has been improving. There are no hypoglycemic associated symptoms. Pertinent negatives for hypoglycemia include no  confusion, headaches, pallor or seizures. Associated symptoms include blurred vision. Pertinent negatives for diabetes include no chest pain, no fatigue, no polydipsia, no polyphagia, no polyuria and no weight loss. There are no hypoglycemic complications. Symptoms are improving. Diabetic complications include peripheral neuropathy. Risk factors for coronary artery disease include diabetes mellitus, sedentary lifestyle and hypertension. Current diabetic treatment includes insulin injections. She is compliant with treatment most of the time. Her weight is increasing steadily. She is following a generally unhealthy diet. When asked about meal planning, she reported none. She has had a previous visit with a dietitian. She never participates in exercise. Her home blood glucose trend is decreasing steadily. Her breakfast blood glucose range is generally >200 mg/dl. Her lunch blood glucose range is generally >200 mg/dl. Her dinner blood glucose range is generally >200 mg/dl. Her overall blood glucose range is >200 mg/dl. An ACE inhibitor/angiotensin II receptor blocker is being taken. Eye exam is current.  Hypertension  This is a chronic problem. The current episode started more than 1 year ago. Associated symptoms include blurred vision. Pertinent negatives include no chest pain, headaches, palpitations or shortness of breath. Risk factors for coronary artery disease include diabetes mellitus, dyslipidemia and sedentary lifestyle. Past treatments include ACE inhibitors and beta blockers.     Review of Systems  Constitutional: Negative for chills, fatigue, fever, unexpected weight change and weight loss.  HENT: Negative for trouble swallowing and voice change.   Eyes: Positive for blurred vision. Negative for  visual disturbance.  Respiratory: Negative for cough, shortness of breath and wheezing.   Cardiovascular: Negative for chest pain, palpitations and leg swelling.  Gastrointestinal: Negative for diarrhea,  nausea and vomiting.  Endocrine: Negative for cold intolerance, heat intolerance, polydipsia, polyphagia and polyuria.  Musculoskeletal: Positive for gait problem. Negative for arthralgias and myalgias.  Skin: Negative for color change, pallor, rash and wound.  Neurological: Negative for seizures and headaches.  Psychiatric/Behavioral: Negative for confusion and suicidal ideas.    Objective:    BP 136/74   Pulse (!) 120   Resp 18   Ht 5' (1.524 m)   Wt 106 lb (48.1 kg)   SpO2 99%   BMI 20.70 kg/m   Wt Readings from Last 3 Encounters:  11/13/15 106 lb (48.1 kg)  10/30/15 104 lb (47.2 kg)  10/23/15 102 lb (46.3 kg)    Physical Exam  Constitutional: She is oriented to person, place, and time.  Cachectic, chronically sick-looking  HENT:  Head: Normocephalic and atraumatic.  Eyes: EOM are normal.  Neck: Normal range of motion. Neck supple. No tracheal deviation present. No thyromegaly present.  Cardiovascular:  Regularly irregular tachycardia.   Pulmonary/Chest: Effort normal and breath sounds normal.  Abdominal: Soft. Bowel sounds are normal. There is no tenderness. There is no guarding.  Musculoskeletal: She exhibits no edema.  She uses a cane to walk around. She has a global loss of skeletal muscle mass.  Neurological: She is alert and oriented to person, place, and time. She has normal reflexes. No cranial nerve deficit. Coordination normal.  Skin: Skin is warm and dry. No rash noted. No erythema. No pallor.  Psychiatric: She has a normal mood and affect. Judgment normal.    Recent Results (from the past 2160 hour(s))  CBC with Differential     Status: Abnormal   Collection Time: 10/16/15  8:49 AM  Result Value Ref Range   WBC 6.7 3.4 - 10.8 x10E3/uL   RBC 5.27 3.77 - 5.28 x10E6/uL   Hemoglobin 14.3 11.1 - 15.9 g/dL   Hematocrit 43.5 34.0 - 46.6 %   MCV 83 79 - 97 fL   MCH 27.1 26.6 - 33.0 pg   MCHC 32.9 31.5 - 35.7 g/dL   RDW 14.3 12.3 - 15.4 %   Platelets 183  150 - 379 x10E3/uL   Neutrophils 60 Not Estab. %   Lymphs 26 Not Estab. %   Monocytes 6 Not Estab. %   Eos 8 Not Estab. %   Basos 0 Not Estab. %   Neutrophils Absolute 4.0 1.4 - 7.0 x10E3/uL   Lymphocytes Absolute 1.7 0.7 - 3.1 x10E3/uL   Monocytes Absolute 0.4 0.1 - 0.9 x10E3/uL   EOS (ABSOLUTE) 0.5 (H) 0.0 - 0.4 x10E3/uL   Basophils Absolute 0.0 0.0 - 0.2 x10E3/uL   Immature Granulocytes 0 Not Estab. %   Immature Grans (Abs) 0.0 0.0 - 0.1 x10E3/uL  Comprehensive metabolic panel     Status: Abnormal   Collection Time: 10/16/15  8:49 AM  Result Value Ref Range   Glucose 398 (H) 65 - 99 mg/dL   BUN 18 6 - 24 mg/dL   Creatinine, Ser 0.97 0.57 - 1.00 mg/dL   GFR calc non Af Amer 67 >59 mL/min/1.73   GFR calc Af Amer 78 >59 mL/min/1.73   BUN/Creatinine Ratio 19 9 - 23   Sodium 137 134 - 144 mmol/L   Potassium 3.8 3.5 - 5.2 mmol/L   Chloride 95 (L) 96 - 106 mmol/L  CO2 26 18 - 29 mmol/L   Calcium 9.7 8.7 - 10.2 mg/dL   Total Protein 7.5 6.0 - 8.5 g/dL   Albumin 4.2 3.5 - 5.5 g/dL   Globulin, Total 3.3 1.5 - 4.5 g/dL   Albumin/Globulin Ratio 1.3 1.2 - 2.2   Bilirubin Total 0.4 0.0 - 1.2 mg/dL   Alkaline Phosphatase 70 39 - 117 IU/L   AST 13 0 - 40 IU/L   ALT 8 0 - 32 IU/L  Hemoglobin A1c     Status: Abnormal   Collection Time: 10/16/15  8:49 AM  Result Value Ref Range   Hgb A1c MFr Bld 11.2 (H) 4.8 - 5.6 %    Comment:          Pre-diabetes: 5.7 - 6.4          Diabetes: >6.4          Glycemic control for adults with diabetes: <7.0    Est. average glucose Bld gHb Est-mCnc 275 mg/dL     Her A1c from 03/06/2015 was extremely high at 15.6%.   Assessment & Plan:    1. Uncontrolled type 1 diabetes mellitus with other specified complication [facility])  - Patient has currently uncontrolled symptomatic type 1 DM ( administrative reclassified as type I for management purposes) since  55 years of age. - She missed her prior appointments. She came with slight improvement in her A1c  to 11.2% from 15.6%.  -She still has significantly above target blood glucose profile despite basal/bolus insulin started last visit a week ago.   - Her diabetes is complicated by neuropathy, and recent unexplained weight loss and patient remains at a high risk for more acute and chronic complications of diabetes which include CAD, CVA, CKD, retinopathy, and neuropathy. These are all discussed in detail with the patient.  - I have counseled the patient on diet management  by adopting a carbohydrate restricted/protein rich diet.   - I encouraged the patient to switch to  and consume more unprocessed or minimally processed complex starch and increased protein intake (animal or plant source), fruits, and vegetables.  - Patient is advised to stick to a routine mealtimes to eat 3 meals  a day and some light snacks, since she has a big weight deficit.   - The patient will be scheduled with Jearld Fenton, RDN, CDE for individualized DM education.  - I have approached patient with the following individualized plan to manage diabetes and patient agrees:   -  She remains noncompliant phosphorus dietary recommendations. She drinks a lot of soda as well as processed snacks and crackers unnecessarily. Her average blood glucose is still above 400 despite large dose of insulin.  - She will be treated exclusively with insulin -basal /bolus basal insulin.  -  I have approached her to stay engaged in strict monitoring of glucose  AC and HS. -I will continue  her basal insulin Levemir  60 units daily at bedtime, increase prandial insulin Novolog  18 units 3 times a day before meals for pre-meal blood glucose above 90 mg/dL.  - Patient is warned not to take insulin without proper monitoring per orders. -Adjustment parameters are given for hypo and hyperglycemia in writing. -Patient is encouraged to call clinic for blood glucose levels less than 70 or above 300 mg /dl.  - She is not a candidate for metformin,  SGLT2 inhibitors, nor  incretin therapy.   - Patient specific target  A1c;  LDL, HDL, Triglycerides, and  Waist  Circumference were discussed in detail.  2) BP/HTN:  Controlled. Continue current medications including ACEI/ARB. She has tachycardia which is persistent. I advised her to see her primary care provider for possible EKG. 3) Lipids/HPL:  Uncontrolled, LDL at 111. She will be considered for statin therapy on subsequent visits. 4) uterine leiomyoma: Large at 24 week. She is currently being evaluated for possible hysterectomy by Dr. Glo Herring. -I advised her to follow up with the plan.  5)  Weight/Diet: CDE Consult in progress , exercise, and detailed carbohydrates information provided..  - She is progressively regaining her weight, a good development for her.  6) Chronic Care/Health Maintenance:  -Patient is on ACEI/ARB medications and encouraged to continue to follow up with Ophthalmology, Podiatrist at least yearly or according to recommendations, and advised to   stay away from smoking. I have recommended yearly flu vaccine and pneumonia vaccination at least every 5 years; moderate intensity exercise for up to 150 minutes weekly; and  sleep for at least 7 hours a day.  - 25 minutes of time was spent on the care of this patient , 50% of which was applied for counseling on diabetes complications and their preventions.  - Patient to bring meter and  blood glucose logs during their next visit. She has orders for A1c, CMP in place by OB/GYN.   - I advised patient to maintain close follow up with House, Deliah Goody, FNP for primary care needs.  Follow up plan: - Return in about 9 weeks (around 01/15/2016) for follow up with pre-visit labs, meter, and logs.  Glade Lloyd, MD Phone: [phone]  Fax: [phone]   11/13/2015, 9:39 AM

## 2015-11-13 NOTE — Progress Notes (Addendum)
Medical Nutrition Therapy:  Appt start time: 1200 end time:  1230.   Assessment:  Primary concerns today: Diabetes Type 1. Here to see Dr. Dorris Fetch today. A1C down to 11.2 from 15.6%.  Boyfriend brought her. 60 units of Lantus and 15 units Novolog TID. Going up to 18 units with meals starting today. BS meter and logs brought BS are extremely high at times. She notes she is drinking soda and snacking. She also reports forgetting her insulin at times.  Drinking some water but not much 1-2 glasses a day. [place] 2 lbs. Still very thin and cachetic. Walks a little better with cane.   Hasn't decided on surgery for abdominal mass.    Diet is low in fresh fruits and vegetables. Eating three meals now a day. Has been eating some spinach at times. Referred to food pantry for food assistance.   Admits to being really thirsty, tired, hungry and sleepy a lot. Can't get blood out to test blood sugars at times due to high blood sugars.    Willing to make changes and cut out soda and work on eating more fresh fruits and vegetables.    Encouraged to consider getting surgery done sooner than later to help improve overall health and improve blood sugars.    She may benefit from a low dose of anxiety medication, Celexa or something similar to help her with her anxiety issues of low blood sugars, fear of getting blood drawn and taking insulin at times.  Lab Results  Component Value Date   HGBA1C 11.2 (H) 10/16/2015     Wt Readings from Last 3 Encounters:  11/13/15 106 lb (48.1 kg)  10/30/15 104 lb (47.2 kg)  10/23/15 102 lb (46.3 kg)   Ht Readings from Last 3 Encounters:  11/13/15 5' (1.524 m)  10/30/15 5' (1.524 m)  10/23/15 5' (1.524 m)    Walks with a cane. Very feeble in walking.   Preferred Learning Style:   Auditory  Visual  Hands on    Learning Readiness:     Ready  Change in progress   MEDICATIONS: See list   DIETARY INTAKE:  24Hr: B)  2 eggs 2 and 2 slices toast and  water Snack: none L) [employer] chicken, creamed potaotes and biscuits, water Dinner: [place] burger and creamed potatoes,  Water Soda none  Usual physical activity: ADL  Estimated energy needs: 1500 calories 170 g carbohydrates 112 g protein 42 g fat  Progress Towards Goal(s):  In progress.   Nutritional Diagnosis:  NB-1.1 Food and nutrition-related knowledge deficit As related to Diabetes.  As evidenced by A1C >15%.    Intervention: . High Calorie High Protein Diet. Ways to add calories and protein with all meals. Goals 1. Cut out ALL sodas, sweet tea 2. Drink only water 3. Take insulin as prescribed . Increase meal time insulin to 18 units with meals 4. Increase vegetables. 5. Get lab work done by 01-15-16. Go by food pantries for food assistance Get A1C down to 10% by next visit  Teaching Method Utilized:  Visual Auditory Hands on  Handouts given during visit include:  The Plate Method  Meal Plan Card  Ways to increase protein and calories..   Barriers to learning/adherence to lifestyle change:  Feeble due to physical condition.   Demonstrated degree of understanding via:  Teach Back   Monitoring/Evaluation:  Dietary intake, exercise, meal planning, SBG, and body weight in 1-2 month(s).  She would benefit from a low dose of anti anxiety  medication, like Celexa to help her be less anxious of testing her blood sugars, taking her insulin and getting her labs drawn as ordered.Marland Kitchen

## 2015-11-13 NOTE — Patient Instructions (Signed)
Goals 1. Cut out ALL sodas, sweet tea 2. Drink only water 3. Take insulin as prescribed . Increase meal time insulin to 18 units with meals 4. Increase vegetables. 5. Get lab work done by 01-15-16. Go by food pantries for food assistance Get A1C down to 10% by next visit

## 2016-01-15 ENCOUNTER — Other Ambulatory Visit: Payer: Self-pay | Admitting: "Endocrinology

## 2016-01-16 LAB — COMPREHENSIVE METABOLIC PANEL
ALK PHOS: 85 IU/L (ref 39–117)
ALT: 12 IU/L (ref 0–32)
AST: 16 IU/L (ref 0–40)
Albumin/Globulin Ratio: 1.5 (ref 1.2–2.2)
Albumin: 4.1 g/dL (ref 3.5–5.5)
BILIRUBIN TOTAL: 0.3 mg/dL (ref 0.0–1.2)
BUN/Creatinine Ratio: 14 (ref 9–23)
BUN: 15 mg/dL (ref 6–24)
CHLORIDE: 97 mmol/L (ref 96–106)
CO2: 23 mmol/L (ref 18–29)
Calcium: 9.6 mg/dL (ref 8.7–10.2)
Creatinine, Ser: 1.1 mg/dL — ABNORMAL HIGH (ref 0.57–1.00)
GFR calc Af Amer: 66 mL/min/{1.73_m2} (ref >59)
GFR calc non Af Amer: 57 mL/min/{1.73_m2} — ABNORMAL LOW (ref >59)
GLUCOSE: 383 mg/dL — AB (ref 65–99)
Globulin, Total: 2.8 g/dL (ref 1.5–4.5)
Potassium: 5.1 mmol/L (ref 3.5–5.2)
Sodium: 135 mmol/L (ref 134–144)
Total Protein: 6.9 g/dL (ref 6.0–8.5)

## 2016-01-16 LAB — HGB A1C W/O EAG: HEMOGLOBIN A1C: 10.9 % — AB (ref 4.8–5.6)

## 2016-01-20 ENCOUNTER — Other Ambulatory Visit: Payer: Self-pay | Admitting: "Endocrinology

## 2016-01-20 DIAGNOSIS — E108 Type 1 diabetes mellitus with unspecified complications: Secondary | ICD-10-CM

## 2016-01-22 ENCOUNTER — Encounter: Payer: Self-pay | Admitting: "Endocrinology

## 2016-01-22 ENCOUNTER — Other Ambulatory Visit: Payer: Self-pay

## 2016-01-22 ENCOUNTER — Ambulatory Visit (INDEPENDENT_AMBULATORY_CARE_PROVIDER_SITE_OTHER): Payer: Medicaid Other | Admitting: "Endocrinology

## 2016-01-22 ENCOUNTER — Encounter: Payer: Medicaid Other | Attending: "Endocrinology | Admitting: Nutrition

## 2016-01-22 VITALS — BP 153/83 | HR 116 | Ht 60.0 in | Wt 121.0 lb

## 2016-01-22 VITALS — Wt 121.0 lb

## 2016-01-22 DIAGNOSIS — E1065 Type 1 diabetes mellitus with hyperglycemia: Secondary | ICD-10-CM | POA: Diagnosis not present

## 2016-01-22 DIAGNOSIS — I10 Essential (primary) hypertension: Secondary | ICD-10-CM

## 2016-01-22 DIAGNOSIS — Z713 Dietary counseling and surveillance: Secondary | ICD-10-CM | POA: Insufficient documentation

## 2016-01-22 DIAGNOSIS — E108 Type 1 diabetes mellitus with unspecified complications: Secondary | ICD-10-CM | POA: Insufficient documentation

## 2016-01-22 DIAGNOSIS — IMO0002 Reserved for concepts with insufficient information to code with codable children: Secondary | ICD-10-CM

## 2016-01-22 DIAGNOSIS — E1069 Type 1 diabetes mellitus with other specified complication: Secondary | ICD-10-CM | POA: Diagnosis not present

## 2016-01-22 MED ORDER — GLUCOSE BLOOD VI STRP: ORAL_STRIP | 2 refills | Status: DC

## 2016-01-22 NOTE — Progress Notes (Signed)
Subjective:    Patient ID: Katherine Patton, female    DOB: 06/28/1960. Patient is being seen in f/u for management of diabetes requested by Bradd Burner, FNP.  Past Medical History:  Diagnosis Date  . Diabetes mellitus without complication ([place])   . Uterine mass    Past Surgical History:  Procedure Laterality Date  . CATARACT EXTRACTION     Social History   Social History  . Marital status: Single    Spouse name: N/A  . Number of children: N/A  . Years of education: N/A   Social History Main Topics  . Smoking status: Never Smoker  . Smokeless tobacco: Never Used  . Alcohol use No  . Drug use: No  . Sexual activity: Not Currently    Birth control/ protection: None   Other Topics Concern  . None   Social History Narrative  . None   Outpatient Encounter Prescriptions as of 01/22/2016  Medication Sig  . acetaminophen (TYLENOL) 500 MG tablet Take 500 mg by mouth every 6 (six) hours as needed for headache.  . gabapentin (NEURONTIN) 300 MG capsule Take 300 mg by mouth 2 (two) times daily.  Marland Kitchen glucose blood (ACCU-CHEK GUIDE) test strip Use as instructed  . insulin aspart (NOVOLOG) 100 UNIT/ML FlexPen Inject 18-24 Units into the skin 3 (three) times daily with meals.  . insulin detemir (LEVEMIR) 100 UNIT/ML injection Inject 70 Units into the skin at bedtime.   No facility-administered encounter medications on file as of 01/22/2016.    ALLERGIES: No Known Allergies VACCINATION STATUS:  There is no immunization history on file for this patient.  Diabetes  She presents for her follow-up diabetic visit. She has type 1 (She is administratively classified as type 1 diabetes for management purposes.) diabetes mellitus. Onset time: She was diagnosed at approximate age of 87 years. Her disease course has been improving. There are no hypoglycemic associated symptoms. Pertinent negatives for hypoglycemia include no confusion, headaches, pallor or seizures. Associated symptoms  include blurred vision. Pertinent negatives for diabetes include no chest pain, no fatigue, no polydipsia, no polyphagia, no polyuria and no weight loss. There are no hypoglycemic complications. Symptoms are improving. Diabetic complications include peripheral neuropathy. Risk factors for coronary artery disease include diabetes mellitus, sedentary lifestyle and hypertension. Current diabetic treatment includes insulin injections. She is compliant with treatment most of the time. Her weight is increasing steadily. She is following a generally unhealthy diet. When asked about meal planning, she reported none. She has had a previous visit with a dietitian. She never participates in exercise. Her home blood glucose trend is decreasing steadily. Her breakfast blood glucose range is generally >200 mg/dl. Her lunch blood glucose range is generally >200 mg/dl. Her dinner blood glucose range is generally >200 mg/dl. Her overall blood glucose range is >200 mg/dl. An ACE inhibitor/angiotensin II receptor blocker is being taken. Eye exam is current.  Hypertension  This is a chronic problem. The current episode started more than 1 year ago. Associated symptoms include blurred vision. Pertinent negatives include no chest pain, headaches, palpitations or shortness of breath. Risk factors for coronary artery disease include diabetes mellitus, dyslipidemia and sedentary lifestyle. Past treatments include ACE inhibitors and beta blockers.     Review of Systems  Constitutional: Negative for chills, fatigue, fever, unexpected weight change and weight loss.  HENT: Negative for trouble swallowing and voice change.   Eyes: Positive for blurred vision. Negative for visual disturbance.  Respiratory: Negative for cough, shortness of breath  and wheezing.   Cardiovascular: Negative for chest pain, palpitations and leg swelling.  Gastrointestinal: Negative for diarrhea, nausea and vomiting.  Endocrine: Negative for cold  intolerance, heat intolerance, polydipsia, polyphagia and polyuria.  Musculoskeletal: Positive for gait problem. Negative for arthralgias and myalgias.  Skin: Negative for color change, pallor, rash and wound.  Neurological: Negative for seizures and headaches.  Psychiatric/Behavioral: Negative for confusion and suicidal ideas.    Objective:    BP (!) 153/83   Pulse (!) 116   Ht 5' (1.524 m)   Wt 121 lb (54.9 kg)   BMI 23.63 kg/m   Wt Readings from Last 3 Encounters:  01/22/16 121 lb (54.9 kg)  11/13/15 104 lb (47.2 kg)  11/13/15 106 lb (48.1 kg)    Physical Exam  Constitutional: She is oriented to person, place, and time.  Cachectic, chronically sick-looking  HENT:  Head: Normocephalic and atraumatic.  Eyes: EOM are normal.  Neck: Normal range of motion. Neck supple. No tracheal deviation present. No thyromegaly present.  Cardiovascular:  Regularly irregular tachycardia.   Pulmonary/Chest: Effort normal and breath sounds normal.  Abdominal: Soft. Bowel sounds are normal. There is no tenderness. There is no guarding.  Musculoskeletal: She exhibits no edema.  She uses a cane to walk around. She has a global loss of skeletal muscle mass.  Neurological: She is alert and oriented to person, place, and time. She has normal reflexes. No cranial nerve deficit. Coordination normal.  Skin: Skin is warm and dry. No rash noted. No erythema. No pallor.  Psychiatric: She has a normal mood and affect. Judgment normal.    Recent Results (from the past 2160 hour(s))  Comprehensive metabolic panel     Status: Abnormal   Collection Time: 01/15/16 10:23 AM  Result Value Ref Range   Glucose 383 (H) 65 - 99 mg/dL   BUN 15 6 - 24 mg/dL   Creatinine, Ser 1.10 (H) 0.57 - 1.00 mg/dL   GFR calc non Af Amer 57 (L) >59 mL/min/1.73   GFR calc Af Amer 66 >59 mL/min/1.73   BUN/Creatinine Ratio 14 9 - 23   Sodium 135 134 - 144 mmol/L   Potassium 5.1 3.5 - 5.2 mmol/L   Chloride 97 96 - 106 mmol/L    CO2 23 18 - 29 mmol/L   Calcium 9.6 8.7 - 10.2 mg/dL   Total Protein 6.9 6.0 - 8.5 g/dL   Albumin 4.1 3.5 - 5.5 g/dL   Globulin, Total 2.8 1.5 - 4.5 g/dL   Albumin/Globulin Ratio 1.5 1.2 - 2.2   Bilirubin Total 0.3 0.0 - 1.2 mg/dL   Alkaline Phosphatase 85 39 - 117 IU/L   AST 16 0 - 40 IU/L   ALT 12 0 - 32 IU/L  Hgb A1c w/o eAG     Status: Abnormal   Collection Time: 01/15/16 10:23 AM  Result Value Ref Range   Hgb A1c MFr Bld 10.9 (H) 4.8 - 5.6 %    Comment:          Pre-diabetes: 5.7 - 6.4          Diabetes: >6.4          Glycemic control for adults with diabetes: <7.0      Her A1c from 03/06/2015 was extremely high at 15.6%.   Assessment & Plan:    1. Uncontrolled type 1 diabetes mellitus with other specified complication [facility])  - Patient has currently uncontrolled symptomatic type 1 DM ( administrative reclassified as type I for  management purposes) since  56 years of age. -  She came with  improvement in her A1c to 10.9%, Generally improving from 15.6%.  -She still has significantly above target blood glucose profile despite basal/bolus insulin - especially at fasting.   - Her diabetes is complicated by neuropathy, and recent unexplained weight loss and patient remains at a high risk for more acute and chronic complications of diabetes which include CAD, CVA, CKD, retinopathy, and neuropathy. These are all discussed in detail with the patient.  - I have counseled the patient on diet management  by adopting a carbohydrate restricted/protein rich diet.   - I encouraged the patient to switch to  and consume more unprocessed or minimally processed complex starch and increased protein intake (animal or plant source), fruits, and vegetables.  - Patient is advised to stick to a routine mealtimes to eat 3 meals  a day and some light snacks, since she has a big weight deficit.   - The patient will be scheduled with Jearld Fenton, RDN, CDE for individualized DM education.  -  I have approached patient with the following individualized plan to manage diabetes and patient agrees:   -  She remains noncompliant phosphorus dietary recommendations. She drinks a lot of soda as well as processed snacks and crackers unnecessarily. Her average blood glucose is still above 250 despite large dose of insulin.  - She will be treated exclusively with insulin -basal /bolus basal insulin.  -  I have approached her to stay engaged in strict monitoring of glucose  AC and HS. -I will increase  her basal insulin Levemir to 70 units daily at bedtime, continue prandial insulin Novolog  18 units 3 times a day before meals for pre-meal blood glucose above 90 mg/dL.  - Patient is warned not to take insulin without proper monitoring per orders. -Adjustment parameters are given for hypo and hyperglycemia in writing. -Patient is encouraged to call clinic for blood glucose levels less than 70 or above 300 mg /dl.  - She is not a candidate for metformin, SGLT2 inhibitors, nor  incretin therapy.   - Patient specific target  A1c;  LDL, HDL, Triglycerides, and  Waist Circumference were discussed in detail.  2) BP/HTN:  Controlled. Continue current medications including ACEI/ARB. She has tachycardia which is persistent. I advised her to see her primary care provider for possible EKG. 3) Lipids/HPL:  Uncontrolled, LDL at 111. She will be considered for statin therapy on subsequent visits. 4) uterine leiomyoma: Large at 24 week. She is currently being evaluated for possible hysterectomy by Dr. Glo Herring. -I advised her to follow up with the plan.  5)  Weight/Diet:  She has appropriately gained 20 pounds so far, CDE Consult in progress , exercise, and detailed carbohydrates information provided.Marland Kitchen  6) Chronic Care/Health Maintenance:  -Patient is on ACEI/ARB medications and encouraged to continue to follow up with Ophthalmology, Podiatrist at least yearly or according to recommendations, and advised to    stay away from smoking. I have recommended yearly flu vaccine and pneumonia vaccination at least every 5 years; moderate intensity exercise for up to 150 minutes weekly; and  sleep for at least 7 hours a day.  - 25 minutes of time was spent on the care of this patient , 50% of which was applied for counseling on diabetes complications and their preventions.  - Patient to bring meter and  blood glucose logs during their next visit. She has orders for A1c, CMP in place by OB/GYN.   -  I advised patient to maintain close follow up with House, Deliah Goody, FNP for primary care needs.  Follow up plan: - Return in about 3 months (around 04/21/2016) for follow up with pre-visit labs, meter, and logs.  Glade Lloyd, MD Phone: [phone]  Fax: [phone]   01/22/2016, 10:12 AM

## 2016-01-22 NOTE — Progress Notes (Signed)
Medical Nutrition Therapy:  Appt start time: 1000 End time 1030   Assessment:  Primary concerns today: Diabetes Type 1. Here to see Dr. Dorris Fetch today. A1C down to 10.9 from > 11%. orginally 15.6%.  Boyfriend brought her.   Has cut down sodas and sweets but not 100%. . Cut down on eating out and cooking more at home. Feels much better. BS are much better but still up and down. Boyfriend still gives her the insulin.  Needs more compliance and consistency of meals and not forgetting night dose of insulin. She has gained about 20 lbs overall and looks much healthier. Not cachetic like she was. Needs more lower carb vegetables and fresh fruit and whole grains. 60 units Latnus and 15 units Novolog with meals.  To see Dr. Dorris Fetch today.    Will call Dr. Glo Herring office today to schedule appt to discuss abdomen mass and surgery.  Lab Results  Component Value Date   HGBA1C 10.9 (H) 01/15/2016     CMP Latest Ref Rng & Units 01/15/2016 10/16/2015 11/21/2014  Glucose 65 - 99 mg/dL 383(H) 398(H) 313(H)  BUN 6 - 24 mg/dL _0 Creatinine 0.57 - 1.00 mg/dL 1.10(H) 0.97 0.86  Sodium 134 - 144 mmol/L 135 137 137  Potassium 3.5 - 5.2 mmol/L 5.1 3.8 3.7  Chloride 96 - 106 mmol/L 97 95(L) 100(L)  CO2 18 - 29 mmol/L _1 Calcium 8.7 - 10.2 mg/dL 9.6 9.7 9.5  Total Protein 6.0 - 8.5 g/dL 6.9 7.5 8.0  Total Bilirubin 0.0 - 1.2 mg/dL 0.3 0.4 0.7  Alkaline Phos 39 - 117 IU/L 85 70 49  AST 0 - 40 IU/L _2 ALT 0 - 32 IU/L 12 8 9(L)    Wt Readings from Last 3 Encounters:  11/13/15 104 lb (47.2 kg)  11/13/15 106 lb (48.1 kg)  10/30/15 104 lb (47.2 kg)   Ht Readings from Last 3 Encounters:  11/13/15 5' (1.524 m)  10/30/15 5' (1.524 m)  10/23/15 5' (1.524 m)    Walks with a cane.   Preferred Learning Style:   Auditory  Visual  Hands on    Learning Readiness:     Ready  Change in progress   MEDICATIONS: See list   DIETARY INTAKE:  24Hr: B)  2 Egg and sausage and pancake,   Snack: none L)  Pimento cheese sandwich and water Dinner: Chicken, mac/cheese and mixed vegetables. water Soda none  Usual physical activity: ADL  Estimated energy needs: 1500 calories 170 g carbohydrates 112 g protein 42 g fat  Progress Towards Goal(s):  In progress.   Nutritional Diagnosis:  NB-1.1 Food and nutrition-related knowledge deficit As related to Diabetes.  As evidenced by A1C >15%.    Intervention: . High Calorie High Protein Diet. Ways to add calories and protein with all meals.  Goals 1.  Cut out sweet and sods 2. Increase more vegetables. 3. Drinking only water 4 Rotatoe insulin site Keep up the good work Dont skip insulin at night  Teaching Method Utilized:  Visual Auditory Hands on  Handouts given during visit include:  The Plate Method  Meal Plan Card  Ways to increase protein and calories..   Barriers to learning/adherence to lifestyle change:  Feeble due to physical condition.   Demonstrated degree of understanding via:  Teach Back   Monitoring/Evaluation:  Dietary intake, exercise, meal planning, SBG, and body weight in 1-2 month(s).  She would benefit from a low dose  of anti anxiety medication, like Celexa to help her be less anxious of testing her blood sugars, taking her insulin and getting her labs drawn as ordered.Marland Kitchen

## 2016-01-22 NOTE — Patient Instructions (Signed)
Goals 1.  Cut out sweet and sods 2. Increase more vegetables. 3. Drinking only water 4 Rotatoe insulin site Keep up the good work Dont skip insulin at night.

## 2016-02-23 IMAGING — CT CT ANGIO CHEST
1 of 6 series · 5 of 36 positions shown · IV contrast (Omnipaque 300)
Comparison: None.

CLINICAL DATA: fell [REDACTED] and [REDACTED] and struck her head x 2.
Reports she fell because her legs feel numb. Reports legs have been
numb for the past few months. Pt says head feels sore. Pt ran out of
glucose test strips

EXAM:
CT ANGIOGRAPHY CHEST WITH CONTRAST
TECHNIQUE: Multidetector CT imaging of the chest was performed using the
standard protocol during bolus administration of intravenous
contrast. Multiplanar CT image reconstructions and MIPs were
obtained to evaluate the vascular anatomy.
CONTRAST:  100mL OMNIPAQUE IOHEXOL 350 MG/ML SOLN

[Series 9: pe 3.0 b40f · axial · 0.59mm/px · z∈[-206,-32]mm · 5 of 88 slices shown]
[im 15/88  lung]
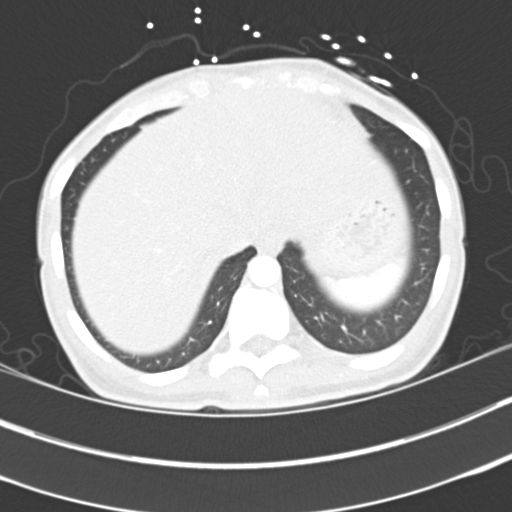
[im 30/88  mediastinal]
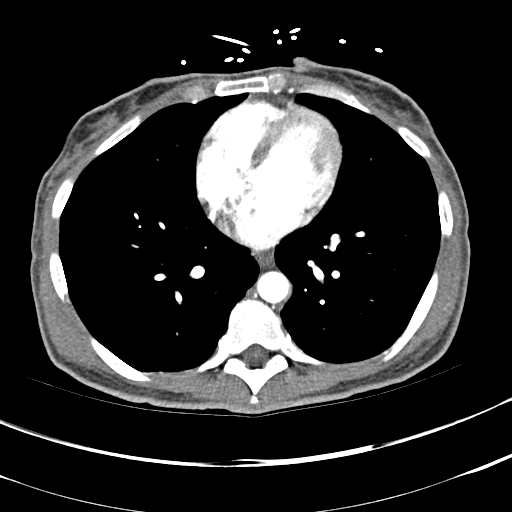
[im 44/88  lung]
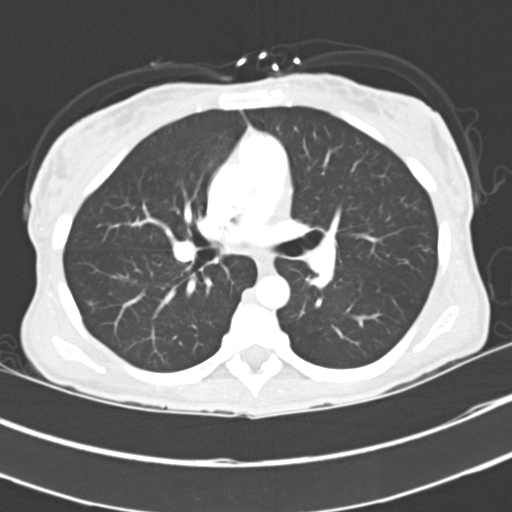
[im 59/88  mediastinal]
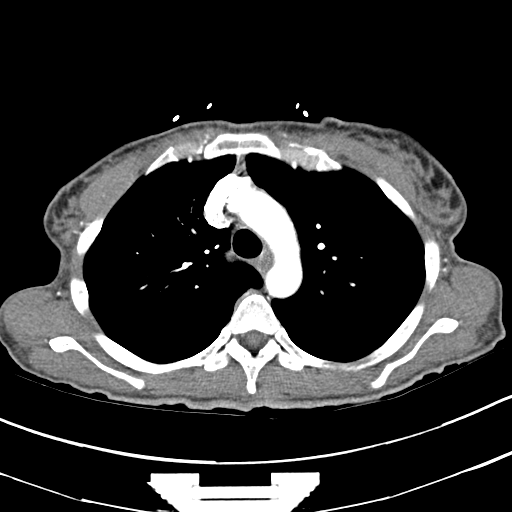
[im 73/88  lung]
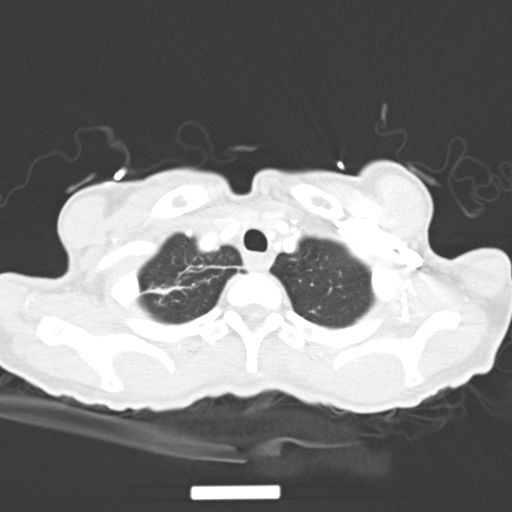

[5 of 36 positions shown; findings below may reference images not displayed]

FINDINGS: Left arm IV contrast injection. The innominate vein and SVC are
patent. The RV is nondilated. Satisfactory opacification of
pulmonary arteries noted, and there is no evidence of pulmonary
emboli. Patent bilateral pulmonary veins. Adequate contrast
opacification of the thoracic aorta with no evidence of dissection,
aneurysm, or stenosis. There is classic 3-vessel brachiocephalic
arch anatomy without proximal stenosis.

No pleural or pericardial effusion. No hilar or mediastinal
adenopathy.

Linear scarring or atelectasis in the apical segment right upper
lobe. Lungs otherwise clear. Thoracic spine and sternum intact.
Hepatomegaly, without focal lesion, liver incompletely visualized.
Remainder visualized upper abdomen unremarkable.

Review of the MIP images confirms the above findings.
IMPRESSION: 1. Negative for acute PE or thoracic aortic dissection.
2. Hepatomegaly.

## 2016-04-15 ENCOUNTER — Other Ambulatory Visit: Payer: Self-pay | Admitting: "Endocrinology

## 2016-04-16 LAB — COMPREHENSIVE METABOLIC PANEL
ALBUMIN: 4.1 g/dL (ref 3.5–5.5)
ALT: 10 IU/L (ref 0–32)
AST: 19 IU/L (ref 0–40)
Albumin/Globulin Ratio: 1.3 (ref 1.2–2.2)
Alkaline Phosphatase: 87 IU/L (ref 39–117)
BUN / CREAT RATIO: 22 (ref 9–23)
BUN: 22 mg/dL (ref 6–24)
Bilirubin Total: 0.2 mg/dL (ref 0.0–1.2)
CO2: 24 mmol/L (ref 18–29)
CREATININE: 0.99 mg/dL (ref 0.57–1.00)
Calcium: 9.5 mg/dL (ref 8.7–10.2)
Chloride: 99 mmol/L (ref 96–106)
GFR, EST AFRICAN AMERICAN: 75 mL/min/{1.73_m2} (ref >59)
GFR, EST NON AFRICAN AMERICAN: 65 mL/min/{1.73_m2} (ref >59)
GLUCOSE: 96 mg/dL (ref 65–99)
Globulin, Total: 3.2 g/dL (ref 1.5–4.5)
Potassium: 4.2 mmol/L (ref 3.5–5.2)
Sodium: 142 mmol/L (ref 134–144)
TOTAL PROTEIN: 7.3 g/dL (ref 6.0–8.5)

## 2016-04-16 LAB — HGB A1C W/O EAG: Hgb A1c MFr Bld: 11.8 % — ABNORMAL HIGH (ref 4.8–5.6)

## 2016-04-16 LAB — AMBIG ABBREV CMP14 DEFAULT

## 2016-04-23 ENCOUNTER — Encounter: Payer: Self-pay | Admitting: "Endocrinology

## 2016-04-23 ENCOUNTER — Ambulatory Visit (INDEPENDENT_AMBULATORY_CARE_PROVIDER_SITE_OTHER): Payer: Medicaid Other | Admitting: "Endocrinology

## 2016-04-23 ENCOUNTER — Other Ambulatory Visit: Payer: Self-pay | Admitting: "Endocrinology

## 2016-04-23 VITALS — BP 151/94 | HR 118 | Ht 60.0 in | Wt 126.0 lb

## 2016-04-23 DIAGNOSIS — I10 Essential (primary) hypertension: Secondary | ICD-10-CM | POA: Diagnosis not present

## 2016-04-23 DIAGNOSIS — Z91199 Patient's noncompliance with other medical treatment and regimen due to unspecified reason: Secondary | ICD-10-CM

## 2016-04-23 DIAGNOSIS — IMO0002 Reserved for concepts with insufficient information to code with codable children: Secondary | ICD-10-CM

## 2016-04-23 DIAGNOSIS — E1065 Type 1 diabetes mellitus with hyperglycemia: Secondary | ICD-10-CM

## 2016-04-23 DIAGNOSIS — Z9119 Patient's noncompliance with other medical treatment and regimen: Secondary | ICD-10-CM | POA: Diagnosis not present

## 2016-04-23 DIAGNOSIS — E1069 Type 1 diabetes mellitus with other specified complication: Secondary | ICD-10-CM

## 2016-04-23 MED ORDER — GLUCOSE BLOOD VI STRP: ORAL_STRIP | 5 refills | Status: DC

## 2016-04-23 NOTE — Progress Notes (Signed)
Subjective:    Patient ID: Katherine Patton, female    DOB: Jan 05, 1961. Patient is being seen in f/u for management of diabetes requested by Bradd Burner, FNP.  Past Medical History:  Diagnosis Date  . Diabetes mellitus without complication ([place])   . Uterine mass    Past Surgical History:  Procedure Laterality Date  . CATARACT EXTRACTION     Social History   Social History  . Marital status: Single    Spouse name: N/A  . Number of children: N/A  . Years of education: N/A   Social History Main Topics  . Smoking status: Never Smoker  . Smokeless tobacco: Never Used  . Alcohol use No  . Drug use: No  . Sexual activity: Not Currently    Birth control/ protection: None   Other Topics Concern  . None   Social History Narrative  . None   Outpatient Encounter Prescriptions as of 04/23/2016  Medication Sig  . acetaminophen (TYLENOL) 500 MG tablet Take 500 mg by mouth every 6 (six) hours as needed for headache.  . gabapentin (NEURONTIN) 300 MG capsule Take 300 mg by mouth 2 (two) times daily.  Marland Kitchen glucose blood (ACCU-CHEK GUIDE) test strip Use as instructed 4 x daily E10.65  . insulin aspart (NOVOLOG) 100 UNIT/ML FlexPen Inject 18-24 Units into the skin 3 (three) times daily with meals.  . insulin detemir (LEVEMIR) 100 UNIT/ML injection Inject 70 Units into the skin at bedtime.   No facility-administered encounter medications on file as of 04/23/2016.    ALLERGIES: No Known Allergies VACCINATION STATUS:  There is no immunization history on file for this patient.  Diabetes  She presents for her follow-up diabetic visit. She has type 1 (She is administratively classified as type 1 diabetes for management purposes.) diabetes mellitus. Onset time: She was diagnosed at approximate age of 48 years. Her disease course has been worsening. There are no hypoglycemic associated symptoms. Pertinent negatives for hypoglycemia include no confusion, headaches, pallor or seizures.  Associated symptoms include blurred vision. Pertinent negatives for diabetes include no chest pain, no fatigue, no polydipsia, no polyphagia, no polyuria and no weight loss. There are no hypoglycemic complications. Symptoms are worsening. Diabetic complications include peripheral neuropathy. Risk factors for coronary artery disease include diabetes mellitus, sedentary lifestyle and hypertension. Current diabetic treatment includes insulin injections. She is compliant with treatment most of the time. Her weight is increasing steadily. She is following a generally unhealthy diet. When asked about meal planning, she reported none. She has had a previous visit with a dietitian. She never participates in exercise. Her home blood glucose trend is decreasing steadily. Her breakfast blood glucose range is generally >200 mg/dl. Her lunch blood glucose range is generally >200 mg/dl. Her dinner blood glucose range is generally >200 mg/dl. Her overall blood glucose range is >200 mg/dl. An ACE inhibitor/angiotensin II receptor blocker is being taken. Eye exam is current.  Hypertension  This is a chronic problem. The current episode started more than 1 year ago. Associated symptoms include blurred vision. Pertinent negatives include no chest pain, headaches, palpitations or shortness of breath. Risk factors for coronary artery disease include diabetes mellitus, dyslipidemia and sedentary lifestyle. Past treatments include ACE inhibitors and beta blockers.    Review of Systems  Constitutional: Negative for chills, fatigue, fever, unexpected weight change and weight loss.  HENT: Negative for trouble swallowing and voice change.   Eyes: Positive for blurred vision. Negative for visual disturbance.  Respiratory: Negative for cough,  shortness of breath and wheezing.   Cardiovascular: Negative for chest pain, palpitations and leg swelling.  Gastrointestinal: Negative for diarrhea, nausea and vomiting.  Endocrine: Negative  for cold intolerance, heat intolerance, polydipsia, polyphagia and polyuria.  Musculoskeletal: Positive for gait problem. Negative for arthralgias and myalgias.  Skin: Negative for color change, pallor, rash and wound.  Neurological: Negative for seizures and headaches.  Psychiatric/Behavioral: Negative for confusion and suicidal ideas.    Objective:    BP (!) 151/94   Pulse (!) 118   Ht 5' (1.524 m)   Wt 126 lb (57.2 kg)   BMI 24.61 kg/m   Wt Readings from Last 3 Encounters:  04/23/16 126 lb (57.2 kg)  01/22/16 121 lb (54.9 kg)  01/22/16 121 lb (54.9 kg)    Physical Exam  Constitutional: She is oriented to person, place, and time.  Cachectic, chronically sick-looking  HENT:  Head: Normocephalic and atraumatic.  Eyes: EOM are normal.  Neck: Normal range of motion. Neck supple. No tracheal deviation present. No thyromegaly present.  Cardiovascular:  Regularly irregular tachycardia.   Pulmonary/Chest: Effort normal and breath sounds normal.  Abdominal: Soft. Bowel sounds are normal. There is no tenderness. There is no guarding.  Musculoskeletal: She exhibits no edema.  She uses a cane to walk around. She has a global loss of skeletal muscle mass.  Neurological: She is alert and oriented to person, place, and time. She has normal reflexes. No cranial nerve deficit. Coordination normal.  Skin: Skin is warm and dry. No rash noted. No erythema. No pallor.  Psychiatric: She has a normal mood and affect. Judgment normal.    Recent Results (from the past 2160 hour(s))  Comprehensive metabolic panel     Status: None   Collection Time: 04/15/16  9:13 AM  Result Value Ref Range   Glucose 96 65 - 99 mg/dL   BUN 22 6 - 24 mg/dL   Creatinine, Ser 0.99 0.57 - 1.00 mg/dL   GFR calc non Af Amer 65 >59 mL/min/1.73   GFR calc Af Amer 75 >59 mL/min/1.73   BUN/Creatinine Ratio 22 9 - 23   Sodium 142 134 - 144 mmol/L   Potassium 4.2 3.5 - 5.2 mmol/L   Chloride 99 96 - 106 mmol/L   CO2 24  18 - 29 mmol/L   Calcium 9.5 8.7 - 10.2 mg/dL   Total Protein 7.3 6.0 - 8.5 g/dL   Albumin 4.1 3.5 - 5.5 g/dL   Globulin, Total 3.2 1.5 - 4.5 g/dL   Albumin/Globulin Ratio 1.3 1.2 - 2.2   Bilirubin Total 0.2 0.0 - 1.2 mg/dL   Alkaline Phosphatase 87 39 - 117 IU/L   AST 19 0 - 40 IU/L   ALT 10 0 - 32 IU/L  Hgb A1c w/o eAG     Status: Abnormal   Collection Time: 04/15/16  9:13 AM  Result Value Ref Range   Hgb A1c MFr Bld 11.8 (H) 4.8 - 5.6 %    Comment:          Pre-diabetes: 5.7 - 6.4          Diabetes: >6.4          Glycemic control for adults with diabetes: <7.0   Ambig Abbrev CMP14 Default     Status: None   Collection Time: 04/15/16  9:13 AM  Result Value Ref Range   Ambig Abbrev CMP14 Default Comment     Comment: A hand-written panel/profile was received from your office. In accordance with  the [facility] Ambiguous Test Code Policy dated July 2725, we have completed your order by using the closest currently or formerly recognized AMA panel.  We have assigned Comprehensive Metabolic Panel (14), Test Code #322000 to this request.  If this is not the testing you wished to receive on this specimen, please contact the [facility] Client Inquiry/Technical Services Department to clarify the test order.  We appreciate your business.      Her A1c from 03/06/2015 was extremely high at 15.6%.   Assessment & Plan:    1. Uncontrolled type 1 diabetes mellitus with other specified complication [facility])  - Patient has currently uncontrolled symptomatic type 1 DM ( administrative reclassified as type I for management purposes) since  56 years of age. -  She came with  Loss of control with A1c increasing to 11.8 from 10.9%, after generally improving from 15.6%.  -She still has significantly above target blood glucose profile despite basal/bolus insulin - especially at fasting. - This is partly because she skips the Levemir due to falling asleep.   - Her diabetes is complicated by neuropathy,  and recent unexplained weight loss and patient remains at a high risk for more acute and chronic complications of diabetes which include CAD, CVA, CKD, retinopathy, and neuropathy. These are all discussed in detail with the patient.  - I have counseled the patient on diet management  by adopting a carbohydrate restricted/protein rich diet.   - I encouraged the patient to switch to  and consume more unprocessed or minimally processed complex starch and increased protein intake (animal or plant source), fruits, and vegetables.  - Patient is advised to stick to a routine mealtimes to eat 3 meals  a day and some light snacks, since she has a big weight deficit.   - The patient will be scheduled with Jearld Fenton, RDN, CDE for individualized DM education.  - I have approached patient with the following individualized plan to manage diabetes and patient agrees:   -  She remains noncompliant as far as dietary recommendations. She drinks a lot of soda as well as processed snacks and crackers unnecessarily. Her average blood glucose is still above 250 despite large dose of insulin.  - She will be treated exclusively with insulin -basal /bolus basal insulin.  -  I have approached her to stay engaged in strict monitoring of glucose  AC and HS. - To avoid skipping basal insulin altogether, I advised her to take  Levemir 70 units earlier at 8 PM every evening, continue prandial insulin Novolog  18 units 3 times a day before meals for pre-meal blood glucose above 90 mg/dL.  - Patient is warned not to take insulin without proper monitoring per orders. -Adjustment parameters are given for hypo and hyperglycemia in writing. -Patient is encouraged to call clinic for blood glucose levels less than 70 or above 300 mg /dl.  - She is not a candidate for metformin, SGLT2 inhibitors, nor  incretin therapy.   - Patient specific target  A1c;  LDL, HDL, Triglycerides, and  Waist Circumference were discussed in  detail.  2) BP/HTN:  Controlled. Continue current medications including ACEI/ARB. She has tachycardia which is persistent. I advised her to see her primary care provider for possible EKG. 3) Lipids/HPL:  Uncontrolled, LDL at 111. She will be considered for statin therapy on subsequent visits. 4) uterine leiomyoma: Large at 24 week. She is currently being evaluated for possible hysterectomy by Dr. Glo Herring. -I advised her to follow up with the  plan.  5)  Weight/Diet:  She has appropriately gained 25 pounds so far, CDE Consult in progress , exercise, and detailed carbohydrates information provided.Marland Kitchen  6) Chronic Care/Health Maintenance:  -Patient is on ACEI/ARB medications and encouraged to continue to follow up with Ophthalmology, Podiatrist at least yearly or according to recommendations, and advised to   stay away from smoking. I have recommended yearly flu vaccine and pneumonia vaccination at least every 5 years; moderate intensity exercise for up to 150 minutes weekly; and  sleep for at least 7 hours a day.  - 25 minutes of time was spent on the care of this patient , 50% of which was applied for counseling on diabetes complications and their preventions.  - Patient to bring meter and  blood glucose logs during their next visit. She has orders for A1c, CMP in place by OB/GYN.   - I advised patient to maintain close follow up with House, Deliah Goody, FNP for primary care needs.  Follow up plan: - Return in about 3 months (around 07/23/2016) for follow up with pre-visit labs, meter, and logs.  Glade Lloyd, MD Phone: [phone]  Fax: [phone]   04/23/2016, 10:20 AM

## 2016-06-05 ENCOUNTER — Other Ambulatory Visit: Payer: Self-pay | Admitting: *Deleted

## 2016-06-05 ENCOUNTER — Telehealth: Payer: Self-pay | Admitting: "Endocrinology

## 2016-06-05 MED ORDER — GLUCOSE BLOOD VI STRP: ORAL_STRIP | 5 refills | Status: DC

## 2016-06-05 NOTE — Telephone Encounter (Signed)
Called patient informed that RX for test strips will be sent to [facility]

## 2016-06-05 NOTE — Telephone Encounter (Signed)
Katherine Patton is needing refill on glucose blood (ACCU-CHEK GUIDE) test strips please advise?

## 2016-07-17 LAB — CMP14+EGFR
ALBUMIN: 4.5 g/dL (ref 3.5–5.5)
ALT: 18 IU/L (ref 0–32)
AST: 18 IU/L (ref 0–40)
Albumin/Globulin Ratio: 1.5 (ref 1.2–2.2)
Alkaline Phosphatase: 99 IU/L (ref 39–117)
BILIRUBIN TOTAL: 0.3 mg/dL (ref 0.0–1.2)
BUN / CREAT RATIO: 14 (ref 9–23)
BUN: 17 mg/dL (ref 6–24)
CHLORIDE: 101 mmol/L (ref 96–106)
CO2: 23 mmol/L (ref 20–29)
Calcium: 9.6 mg/dL (ref 8.7–10.2)
Creatinine, Ser: 1.25 mg/dL — ABNORMAL HIGH (ref 0.57–1.00)
GFR calc non Af Amer: 49 mL/min/{1.73_m2} — ABNORMAL LOW (ref >59)
GFR, EST AFRICAN AMERICAN: 57 mL/min/{1.73_m2} — AB (ref >59)
GLUCOSE: 252 mg/dL — AB (ref 65–99)
Globulin, Total: 3.1 g/dL (ref 1.5–4.5)
POTASSIUM: 4.4 mmol/L (ref 3.5–5.2)
Sodium: 140 mmol/L (ref 134–144)
Total Protein: 7.6 g/dL (ref 6.0–8.5)

## 2016-07-23 ENCOUNTER — Encounter: Payer: Self-pay | Admitting: "Endocrinology

## 2016-07-23 ENCOUNTER — Ambulatory Visit (INDEPENDENT_AMBULATORY_CARE_PROVIDER_SITE_OTHER): Payer: Medicaid Other | Admitting: "Endocrinology

## 2016-07-23 VITALS — BP 155/82 | HR 108 | Ht 60.0 in | Wt 135.0 lb

## 2016-07-23 DIAGNOSIS — E1069 Type 1 diabetes mellitus with other specified complication: Secondary | ICD-10-CM

## 2016-07-23 DIAGNOSIS — E1065 Type 1 diabetes mellitus with hyperglycemia: Secondary | ICD-10-CM

## 2016-07-23 DIAGNOSIS — I10 Essential (primary) hypertension: Secondary | ICD-10-CM | POA: Diagnosis not present

## 2016-07-23 DIAGNOSIS — IMO0002 Reserved for concepts with insufficient information to code with codable children: Secondary | ICD-10-CM

## 2016-07-23 MED ORDER — INSULIN ASPART 100 UNIT/ML FLEXPEN: 15.0000 [IU] | PEN_INJECTOR | Freq: Three times a day (TID) | SUBCUTANEOUS | 3 refills | Status: DC

## 2016-07-23 MED ORDER — LISINOPRIL-HYDROCHLOROTHIAZIDE 10-12.5 MG PO TABS: 1.0000 | ORAL_TABLET | Freq: Every day | ORAL | 3 refills | Status: DC

## 2016-07-23 MED ORDER — GLUCOSE BLOOD VI STRP: ORAL_STRIP | 5 refills | Status: DC

## 2016-07-23 NOTE — Progress Notes (Signed)
Subjective:    Patient ID: Katherine Patton, female    DOB: January 11, 1961. Patient is being seen in f/u for management of diabetes requested by Bradd Burner, FNP.  Past Medical History:  Diagnosis Date  . Diabetes mellitus without complication ([place])   . Uterine mass    Past Surgical History:  Procedure Laterality Date  . CATARACT EXTRACTION     Social History   Social History  . Marital status: Single    Spouse name: N/A  . Number of children: N/A  . Years of education: N/A   Social History Main Topics  . Smoking status: Never Smoker  . Smokeless tobacco: Never Used  . Alcohol use No  . Drug use: No  . Sexual activity: Not Currently    Birth control/ protection: None   Other Topics Concern  . None   Social History Narrative  . None   Outpatient Encounter Prescriptions as of 07/23/2016  Medication Sig  . acetaminophen (TYLENOL) 500 MG tablet Take 500 mg by mouth every 6 (six) hours as needed for headache.  . gabapentin (NEURONTIN) 300 MG capsule Take 300 mg by mouth 2 (two) times daily.  Marland Kitchen glucose blood (ACCU-CHEK GUIDE) test strip Use as instructed 4 x daily E10.65  . insulin aspart (NOVOLOG FLEXPEN) 100 UNIT/ML FlexPen Inject 15-21 Units into the skin 3 (three) times daily with meals.  . insulin detemir (LEVEMIR) 100 UNIT/ML injection Inject 70 Units into the skin at bedtime.  Marland Kitchen lisinopril-hydrochlorothiazide (PRINZIDE,ZESTORETIC) 10-12.5 MG tablet Take 1 tablet by mouth daily.  . [DISCONTINUED] glucose blood (ACCU-CHEK GUIDE) test strip Use as instructed 4 x daily E10.65  . [DISCONTINUED] NOVOLOG FLEXPEN 100 UNIT/ML FlexPen INECT 10-16 UNITS INTO THE SKIN THREE TIMES DAILY WITH MEALS   No facility-administered encounter medications on file as of 07/23/2016.    ALLERGIES: No Known Allergies VACCINATION STATUS:  There is no immunization history on file for this patient.  Diabetes  She presents for her follow-up diabetic visit. She has type 1 (She is  administratively classified as type 1 diabetes for management purposes.) diabetes mellitus. Onset time: She was diagnosed at approximate age of 23 years. Her disease course has been worsening. There are no hypoglycemic associated symptoms. Pertinent negatives for hypoglycemia include no confusion, headaches, pallor or seizures. Associated symptoms include blurred vision. Pertinent negatives for diabetes include no chest pain, no fatigue, no polydipsia, no polyphagia, no polyuria and no weight loss. There are no hypoglycemic complications. Symptoms are worsening. Diabetic complications include peripheral neuropathy. Risk factors for coronary artery disease include diabetes mellitus, sedentary lifestyle and hypertension. Current diabetic treatment includes insulin injections. She is compliant with treatment most of the time. Her weight is increasing steadily. She is following a generally unhealthy diet. When asked about meal planning, she reported none. She has had a previous visit with a dietitian. She never participates in exercise. Her home blood glucose trend is decreasing steadily. Her breakfast blood glucose range is generally >200 mg/dl. Her lunch blood glucose range is generally 140-180 mg/dl. Her dinner blood glucose range is generally 140-180 mg/dl. Her overall blood glucose range is 140-180 mg/dl. An ACE inhibitor/angiotensin II receptor blocker is being taken. Eye exam is current.  Hypertension  This is a chronic problem. The current episode started more than 1 year ago. Associated symptoms include blurred vision. Pertinent negatives include no chest pain, headaches, palpitations or shortness of breath. Risk factors for coronary artery disease include diabetes mellitus, dyslipidemia and sedentary lifestyle. Past treatments include  ACE inhibitors and beta blockers.    Review of Systems  Constitutional: Negative for chills, fatigue, fever, unexpected weight change and weight loss.  HENT: Negative for  trouble swallowing and voice change.   Eyes: Positive for blurred vision. Negative for visual disturbance.  Respiratory: Negative for cough, shortness of breath and wheezing.   Cardiovascular: Negative for chest pain, palpitations and leg swelling.  Gastrointestinal: Negative for diarrhea, nausea and vomiting.  Endocrine: Negative for cold intolerance, heat intolerance, polydipsia, polyphagia and polyuria.  Musculoskeletal: Positive for gait problem. Negative for arthralgias and myalgias.  Skin: Negative for color change, pallor, rash and wound.  Neurological: Negative for seizures and headaches.  Psychiatric/Behavioral: Negative for confusion and suicidal ideas.    Objective:    BP (!) 155/82   Pulse (!) 108   Ht 5' (1.524 m)   Wt 135 lb (61.2 kg)   BMI 26.37 kg/m   Wt Readings from Last 3 Encounters:  07/23/16 135 lb (61.2 kg)  04/23/16 126 lb (57.2 kg)  01/22/16 121 lb (54.9 kg)    Physical Exam  Constitutional: She is oriented to person, place, and time.  Cachectic, chronically sick-looking  HENT:  Head: Normocephalic and atraumatic.  Eyes: EOM are normal.  Neck: Normal range of motion. Neck supple. No tracheal deviation present. No thyromegaly present.  Cardiovascular:  Regularly irregular tachycardia.   Pulmonary/Chest: Effort normal and breath sounds normal.  Abdominal: Soft. Bowel sounds are normal. There is no tenderness. There is no guarding.  Musculoskeletal: She exhibits no edema.  She uses a cane to walk around. She has a global loss of skeletal muscle mass.  Neurological: She is alert and oriented to person, place, and time. She has normal reflexes. No cranial nerve deficit. Coordination normal.  Skin: Skin is warm and dry. No rash noted. No erythema. No pallor.  Psychiatric: She has a normal mood and affect. Judgment normal.    Recent Results (from the past 2160 hour(s))  CMP14+EGFR     Status: Abnormal   Collection Time: 07/16/16  8:55 AM  Result Value  Ref Range   Glucose 252 (H) 65 - 99 mg/dL   BUN 17 6 - 24 mg/dL   Creatinine, Ser 1.25 (H) 0.57 - 1.00 mg/dL   GFR calc non Af Amer 49 (L) >59 mL/min/1.73   GFR calc Af Amer 57 (L) >59 mL/min/1.73   BUN/Creatinine Ratio 14 9 - 23   Sodium 140 134 - 144 mmol/L   Potassium 4.4 3.5 - 5.2 mmol/L   Chloride 101 96 - 106 mmol/L   CO2 23 20 - 29 mmol/L    Comment:               **Please note reference interval change**   Calcium 9.6 8.7 - 10.2 mg/dL   Total Protein 7.6 6.0 - 8.5 g/dL   Albumin 4.5 3.5 - 5.5 g/dL   Globulin, Total 3.1 1.5 - 4.5 g/dL   Albumin/Globulin Ratio 1.5 1.2 - 2.2   Bilirubin Total 0.3 0.0 - 1.2 mg/dL   Alkaline Phosphatase 99 39 - 117 IU/L   AST 18 0 - 40 IU/L   ALT 18 0 - 32 IU/L   - labs from April 2018 showed A1c of 11.8 percent. His current labs did not include A1c- omission by [facility].  Her A1c from 03/06/2015 was extremely high at 15.6%.   Assessment & Plan:    1. Uncontrolled type 1 diabetes mellitus with other specified complication [facility])  - Patient  has currently uncontrolled symptomatic type 1 DM ( administrative reclassified as type I for management purposes) since  56 years of age. -  She came with  Better glucose profile, her labs did not include A1c this time. -  Her A1c was progressively improving from 15.6%.  -She still has significantly above target blood glucose profile despite basal/bolus insulin - especially at fasting. - This is partly because she skips the Levemir due to falling asleep.   - Her diabetes is complicated by neuropathy, and recent unexplained weight loss and patient remains at a high risk for more acute and chronic complications of diabetes which include CAD, CVA, CKD, retinopathy, and neuropathy. These are all discussed in detail with the patient.  - I have counseled the patient on diet management  by adopting a carbohydrate restricted/protein rich diet.   - I encouraged the patient to switch to  and consume more  unprocessed or minimally processed complex starch and increased protein intake (animal or plant source), fruits, and vegetables.  - Patient is advised to stick to a routine mealtimes to eat 3 meals  a day and some light snacks, since she has a big weight deficit.    - I have approached patient with the following individualized plan to manage diabetes and patient agrees:   -  She remains noncompliant as far as dietary recommendations. She drinks a lot of soda as well as processed snacks and crackers unnecessarily.  Her average blood glucose is still above 250 despite large dose of insulin,  This is largely driven by her consumption of large quantities of processed carbohydrates in the evening hours. - She will be treated exclusively with insulin -basal /bolus basal insulin.  -  I have approached her to stay engaged in strict monitoring of glucose  AC and HS. - To avoid skipping basal insulin altogether, I advised her to take  Levemir 70 units earlier at 8 PM every evening, lower her NovoLog to 15 units 3 times a day before meals  for pre-meal blood glucose above 90 mg/dL.  - Patient is warned not to take insulin without proper monitoring per orders. -Adjustment parameters are given for hypo and hyperglycemia in writing. -Patient is encouraged to call clinic for blood glucose levels less than 70 or above 300 mg /dl.  - She is not a candidate for metformin, SGLT2 inhibitors, nor  incretin therapy.   - Patient specific target  A1c;  LDL, HDL, Triglycerides, and  Waist Circumference were discussed in detail.  2) BP/HTN:  uncontrolled.   I have discussed and added this in a previous/hydrochlorothiazide to control hypertension to target.  She has tachycardia which is persistent. I advised her to see her primary care provider for possible EKG.  3) Lipids/HPL:  Uncontrolled, LDL at 111. She will be considered for statin therapy on subsequent visits.  4) uterine leiomyoma: Large at 24 week. She is  currently being evaluated for possible hysterectomy by Dr. Glo Herring. -I advised her to follow up with the plan.  5)  Weight/Diet:  She has appropriately gained 40 pounds so far, CDE Consult in progress , exercise, and detailed carbohydrates information provided.  6) Chronic Care/Health Maintenance:  -Patient is on ACEI/ARB medications and encouraged to continue to follow up with Ophthalmology, Podiatrist at least yearly or according to recommendations, and advised to   stay away from smoking. I have recommended yearly flu vaccine and pneumonia vaccination at least every 5 years; moderate intensity exercise for up to 150 minutes  weekly; and  sleep for at least 7 hours a day.  - 25 minutes of time was spent on the care of this patient , 50% of which was applied for counseling on diabetes complications and their preventions.  - Patient to bring meter and  blood glucose logs during her next visit. She has orders for A1c, CMP in place by OB/GYN.   - I advised patient to maintain close follow up with House, Deliah Goody, FNP for primary care needs.  Follow up plan: - Return in about 3 months (around 10/23/2016) for follow up with pre-visit labs, meter, and logs.  Glade Lloyd, MD Phone: [phone]  Fax: [phone]   07/23/2016, 10:26 AM

## 2016-10-14 ENCOUNTER — Other Ambulatory Visit: Payer: Self-pay | Admitting: "Endocrinology

## 2016-10-15 LAB — RENAL FUNCTION PANEL
ALBUMIN: 4.4 g/dL (ref 3.5–5.5)
BUN/Creatinine Ratio: 16 (ref 9–23)
BUN: 20 mg/dL (ref 6–24)
CALCIUM: 9.1 mg/dL (ref 8.7–10.2)
CO2: 24 mmol/L (ref 20–29)
CREATININE: 1.28 mg/dL — AB (ref 0.57–1.00)
Chloride: 100 mmol/L (ref 96–106)
GFR calc Af Amer: 55 mL/min/{1.73_m2} — ABNORMAL LOW (ref >59)
GFR, EST NON AFRICAN AMERICAN: 48 mL/min/{1.73_m2} — AB (ref >59)
Glucose: 174 mg/dL — ABNORMAL HIGH (ref 65–99)
Phosphorus: 2.5 mg/dL (ref 2.5–4.5)
Potassium: 4.1 mmol/L (ref 3.5–5.2)
SODIUM: 140 mmol/L (ref 134–144)

## 2016-10-15 LAB — HGB A1C W/O EAG: HEMOGLOBIN A1C: 9.8 % — AB (ref 4.8–5.6)

## 2016-10-23 ENCOUNTER — Ambulatory Visit: Payer: Medicaid Other | Admitting: "Endocrinology

## 2016-10-27 ENCOUNTER — Other Ambulatory Visit: Payer: Self-pay

## 2016-10-27 MED ORDER — GLUCOSE BLOOD VI STRP: ORAL_STRIP | 5 refills | Status: DC

## 2016-10-27 MED ORDER — INSULIN ASPART 100 UNIT/ML FLEXPEN: 15.0000 [IU] | PEN_INJECTOR | Freq: Three times a day (TID) | SUBCUTANEOUS | 3 refills | Status: DC

## 2016-11-18 ENCOUNTER — Encounter: Payer: Self-pay | Admitting: "Endocrinology

## 2016-11-18 ENCOUNTER — Ambulatory Visit (INDEPENDENT_AMBULATORY_CARE_PROVIDER_SITE_OTHER): Payer: Medicaid Other | Admitting: "Endocrinology

## 2016-11-18 VITALS — BP 155/87 | HR 97 | Ht 60.0 in | Wt 147.0 lb

## 2016-11-18 DIAGNOSIS — E1065 Type 1 diabetes mellitus with hyperglycemia: Secondary | ICD-10-CM

## 2016-11-18 DIAGNOSIS — I10 Essential (primary) hypertension: Secondary | ICD-10-CM | POA: Diagnosis not present

## 2016-11-18 DIAGNOSIS — E108 Type 1 diabetes mellitus with unspecified complications: Secondary | ICD-10-CM | POA: Diagnosis not present

## 2016-11-18 DIAGNOSIS — IMO0002 Reserved for concepts with insufficient information to code with codable children: Secondary | ICD-10-CM

## 2016-11-18 MED ORDER — INSULIN DETEMIR 100 UNIT/ML ~~LOC~~ SOLN: SUBCUTANEOUS | 2 refills | Status: DC

## 2016-11-18 MED ORDER — PEN NEEDLES 30G X 8 MM MISC: 1.0000 | Freq: Four times a day (QID) | 5 refills | Status: DC

## 2016-11-18 NOTE — Progress Notes (Signed)
Subjective:    Patient ID: Katherine Patton, female    DOB: May 01, 1960. Patient is being seen in f/u for management of diabetes requested by Bradd Burner, FNP.  Past Medical History:  Diagnosis Date  . Diabetes mellitus without complication ([place])   . Uterine mass    Past Surgical History:  Procedure Laterality Date  . CATARACT EXTRACTION     Social History   Socioeconomic History  . Marital status: Single    Spouse name: None  . Number of children: None  . Years of education: None  . Highest education level: None  Social Needs  . Financial resource strain: None  . Food insecurity - worry: None  . Food insecurity - inability: None  . Transportation needs - medical: None  . Transportation needs - non-medical: None  Occupational History  . None  Tobacco Use  . Smoking status: Never Smoker  . Smokeless tobacco: Never Used  Substance and Sexual Activity  . Alcohol use: No  . Drug use: No  . Sexual activity: Not Currently    Birth control/protection: None  Other Topics Concern  . None  Social History Narrative  . None   Outpatient Encounter Medications as of 11/18/2016  Medication Sig  . Insulin Pen Needle (PEN NEEDLES) 30G X 8 MM MISC 1 each 4 (four) times daily by Does not apply route.  . [DISCONTINUED] Insulin Pen Needle (PEN NEEDLES) 30G X 8 MM MISC 1 each 4 (four) times daily by Does not apply route.  Marland Kitchen acetaminophen (TYLENOL) 500 MG tablet Take 500 mg by mouth every 6 (six) hours as needed for headache.  . gabapentin (NEURONTIN) 300 MG capsule Take 300 mg by mouth 2 (two) times daily.  Marland Kitchen glucose blood (ACCU-CHEK GUIDE) test strip Use as instructed 4 x daily E10.65  . glucose blood (ACCU-CHEK GUIDE) test strip Use as instructed 4 x daily. e11.65  . insulin aspart (NOVOLOG FLEXPEN) 100 UNIT/ML FlexPen Inject 15-21 Units into the skin 3 (three) times daily with meals.  . insulin detemir (LEVEMIR) 100 UNIT/ML injection 50 units daily at 8PM  .  lisinopril-hydrochlorothiazide (PRINZIDE,ZESTORETIC) 10-12.5 MG tablet Take 1 tablet by mouth daily.  . [DISCONTINUED] insulin detemir (LEVEMIR) 100 UNIT/ML injection Inject 70 Units into the skin at bedtime.   No facility-administered encounter medications on file as of 11/18/2016.    ALLERGIES: No Known Allergies VACCINATION STATUS:  There is no immunization history on file for this patient.  Diabetes  She presents for her follow-up diabetic visit. She has type 1 (She is administratively classified as type 1 diabetes for management purposes.) diabetes mellitus. Onset time: She was diagnosed at approximate age of 36 years. Her disease course has been improving. There are no hypoglycemic associated symptoms. Pertinent negatives for hypoglycemia include no confusion, headaches, pallor or seizures. Associated symptoms include blurred vision. Pertinent negatives for diabetes include no chest pain, no fatigue, no polydipsia, no polyphagia, no polyuria and no weight loss. There are no hypoglycemic complications. Symptoms are improving. Diabetic complications include peripheral neuropathy. Risk factors for coronary artery disease include diabetes mellitus, sedentary lifestyle and hypertension. Current diabetic treatment includes insulin injections (She discloses today to me that she has been using Levemir from 2017.). She is compliant with treatment most of the time. Her weight is increasing steadily. She is following a generally unhealthy diet. When asked about meal planning, she reported none. She has had a previous visit with a dietitian. She never participates in exercise. Her home blood glucose  trend is decreasing steadily. Her breakfast blood glucose range is generally >200 mg/dl. Her lunch blood glucose range is generally 180-200 mg/dl. Her dinner blood glucose range is generally 180-200 mg/dl. Her bedtime blood glucose range is generally 180-200 mg/dl. Her overall blood glucose range is 180-200 mg/dl.  An ACE inhibitor/angiotensin II receptor blocker is being taken. Eye exam is current.  Hypertension  This is a chronic problem. The current episode started more than 1 year ago. Associated symptoms include blurred vision. Pertinent negatives include no chest pain, headaches, palpitations or shortness of breath. Risk factors for coronary artery disease include diabetes mellitus, dyslipidemia and sedentary lifestyle. Past treatments include ACE inhibitors and beta blockers.    Review of Systems  Constitutional: Negative for chills, fatigue, fever, unexpected weight change and weight loss.  HENT: Negative for trouble swallowing and voice change.   Eyes: Positive for blurred vision. Negative for visual disturbance.  Respiratory: Negative for cough, shortness of breath and wheezing.   Cardiovascular: Negative for chest pain, palpitations and leg swelling.  Gastrointestinal: Negative for diarrhea, nausea and vomiting.  Endocrine: Negative for cold intolerance, heat intolerance, polydipsia, polyphagia and polyuria.  Musculoskeletal: Positive for gait problem. Negative for arthralgias and myalgias.  Skin: Negative for color change, pallor, rash and wound.  Neurological: Negative for seizures and headaches.  Psychiatric/Behavioral: Negative for confusion and suicidal ideas.    Objective:    BP (!) 155/87 (BP Location: Right Arm, Patient Position: Sitting, Cuff Size: Normal)   Pulse 97   Ht 5' (1.524 m)   Wt 147 lb (66.7 kg)   BMI 28.71 kg/m   Wt Readings from Last 3 Encounters:  11/18/16 147 lb (66.7 kg)  07/23/16 135 lb (61.2 kg)  04/23/16 126 lb (57.2 kg)    Physical Exam  Constitutional: She is oriented to person, place, and time.  Cachectic, chronically sick-looking  HENT:  Head: Normocephalic and atraumatic.  Eyes: EOM are normal.  Neck: Normal range of motion. Neck supple. No tracheal deviation present. No thyromegaly present.  Cardiovascular:  Regularly irregular tachycardia.    Pulmonary/Chest: Effort normal and breath sounds normal.  Abdominal: Soft. Bowel sounds are normal. There is no tenderness. There is no guarding.  Musculoskeletal: She exhibits no edema.  She uses a cane to walk around. She has a global loss of skeletal muscle mass.  Neurological: She is alert and oriented to person, place, and time. She has normal reflexes. No cranial nerve deficit. Coordination normal.  Skin: Skin is warm and dry. No rash noted. No erythema. No pallor.  Psychiatric: She has a normal mood and affect. Judgment normal.    Recent Results (from the past 2160 hour(s))  Renal function panel     Status: Abnormal   Collection Time: 10/14/16  8:28 AM  Result Value Ref Range   Glucose 174 (H) 65 - 99 mg/dL   BUN 20 6 - 24 mg/dL   Creatinine, Ser 1.28 (H) 0.57 - 1.00 mg/dL   GFR calc non Af Amer 48 (L) >59 mL/min/1.73   GFR calc Af Amer 55 (L) >59 mL/min/1.73   BUN/Creatinine Ratio 16 9 - 23   Sodium 140 134 - 144 mmol/L   Potassium 4.1 3.5 - 5.2 mmol/L   Chloride 100 96 - 106 mmol/L   CO2 24 20 - 29 mmol/L   Calcium 9.1 8.7 - 10.2 mg/dL   Phosphorus 2.5 2.5 - 4.5 mg/dL   Albumin 4.4 3.5 - 5.5 g/dL  Hgb A1c w/o eAG  Status: Abnormal   Collection Time: 10/14/16  8:28 AM  Result Value Ref Range   Hgb A1c MFr Bld 9.8 (H) 4.8 - 5.6 %    Comment:          Prediabetes: 5.7 - 6.4          Diabetes: >6.4          Glycemic control for adults with diabetes: <7.0      Her A1c from 03/06/2015 was extremely high at 15.6%.   Assessment & Plan:    1. Uncontrolled type 1 diabetes mellitus with other specified complication [facility])  - Patient has currently uncontrolled symptomatic type 1 DM ( administrative reclassified as type I for management purposes) since  56 years of age. -  She came with  Better glucose profile. -  Her A1c has improved to 9.8%, from 15.6%.  -She still has significantly above target blood glucose profile despite basal/bolus insulin - especially at  fasting. - This is partly because she skips the Levemir due to falling asleep.   - Her diabetes is complicated by neuropathy, and recent unexplained weight loss and patient remains at a high risk for more acute and chronic complications of diabetes which include CAD, CVA, CKD, retinopathy, and neuropathy. These are all discussed in detail with the patient.  - I have counseled the patient on diet management  by adopting a carbohydrate restricted/protein rich diet.   - I encouraged the patient to switch to  and consume more unprocessed or minimally processed complex starch and increased protein intake (animal or plant source), fruits, and vegetables.  - Patient is advised to stick to a routine mealtimes to eat 3 meals  a day and some light snacks, since she has a big weight deficit.    - I have approached patient with the following individualized plan to manage diabetes and patient agrees:   -  She remains noncompliant as far as dietary recommendations. She drinks a lot of soda as well as processed snacks and crackers unnecessarily.  Her average blood glucose is 211 despite large dose of insulin ( today she has disclosed that she has been using Levemir from 2017-expired).  - She will be treated exclusively with insulin -basal /bolus basal insulin.  - I advised her to discontinue using expired insulin. I will prescribed Levemir violence for her. - I will lower her dose of Levemir to 50 units daily at 8 PM, continue NovoLog at 15 units 3 times a day before meals  for pre-meal blood glucose above 90 mg/dL, plus correction for pre-meal blood glucose above 150 mg/dL. - Patient is warned not to take insulin without proper monitoring per orders. -Adjustment parameters are given for hypo and hyperglycemia in writing. -Patient is encouraged to call clinic for blood glucose levels less than 70 or above 300 mg /dl.  - She is not a candidate for metformin, SGLT2 inhibitors, nor  incretin therapy.   -  Patient specific target  A1c;  LDL, HDL, Triglycerides, and  Waist Circumference were discussed in detail.  2) BP/HTN:  Uncontrolled. She is on lisinopril/HCTZ. I have advised her to continue on same for control of blood pressure to target.    3) Lipids/HPL:  Uncontrolled, LDL at 111. She will have fasting lipid panel before her next visit.   4) uterine leiomyoma: Large at 24 week. She is currently being evaluated for possible hysterectomy by Dr. Glo Herring. She has been reluctant to have surgery. -I advised her to follow up  with the plan.  5)  Weight/Diet:  She has appropriately gained 52 pounds so far, CDE Consult in progress , exercise, and detailed carbohydrates information provided.  6) Chronic Care/Health Maintenance:  -Patient is on ACEI/ARB medications and encouraged to continue to follow up with Ophthalmology, Podiatrist at least yearly or according to recommendations, and advised to   stay away from smoking. I have recommended yearly flu vaccine and pneumonia vaccination at least every 5 years; moderate intensity exercise for up to 150 minutes weekly; and  sleep for at least 7 hours a day.  - I advised patient to maintain close follow up with House, Deliah Goody, FNP for primary care needs.  - Time spent with the patient: 25 min, of which >50% was spent in reviewing her sugar logs , discussing her hypo- and hyper-glycemic episodes, reviewing her current and  previous labs and insulin doses and developing a plan to avoid hypo- and hyper-glycemia.   Follow up plan: - Return in about 3 months (around 02/18/2017) for meter, and logs.  Glade Lloyd, MD Phone: [phone]  Fax: [phone]   -  This note was partially dictated with voice recognition software. Similar sounding words can be transcribed inadequately or may not  be corrected upon review.  11/18/2016, 9:30 AM

## 2016-12-18 ENCOUNTER — Other Ambulatory Visit: Payer: Self-pay

## 2016-12-18 MED ORDER — INSULIN ASPART 100 UNIT/ML FLEXPEN: 15.0000 [IU] | PEN_INJECTOR | Freq: Three times a day (TID) | SUBCUTANEOUS | 3 refills | Status: DC

## 2016-12-18 MED ORDER — GLUCOSE BLOOD VI STRP: ORAL_STRIP | 5 refills | Status: DC

## 2017-02-16 ENCOUNTER — Other Ambulatory Visit: Payer: Self-pay | Admitting: "Endocrinology

## 2017-02-17 LAB — COMPREHENSIVE METABOLIC PANEL
ALBUMIN: 4.1 g/dL (ref 3.5–5.5)
ALK PHOS: 113 IU/L (ref 39–117)
ALT: 16 IU/L (ref 0–32)
AST: 18 IU/L (ref 0–40)
Albumin/Globulin Ratio: 1.3 (ref 1.2–2.2)
BILIRUBIN TOTAL: 0.3 mg/dL (ref 0.0–1.2)
BUN / CREAT RATIO: 18 (ref 9–23)
BUN: 24 mg/dL (ref 6–24)
CHLORIDE: 99 mmol/L (ref 96–106)
CO2: 23 mmol/L (ref 20–29)
Calcium: 9.1 mg/dL (ref 8.7–10.2)
Creatinine, Ser: 1.3 mg/dL — ABNORMAL HIGH (ref 0.57–1.00)
GFR calc Af Amer: 54 mL/min/{1.73_m2} — ABNORMAL LOW (ref >59)
GFR calc non Af Amer: 47 mL/min/{1.73_m2} — ABNORMAL LOW (ref >59)
GLUCOSE: 336 mg/dL — AB (ref 65–99)
Globulin, Total: 3.1 g/dL (ref 1.5–4.5)
Potassium: 4.2 mmol/L (ref 3.5–5.2)
SODIUM: 138 mmol/L (ref 134–144)
Total Protein: 7.2 g/dL (ref 6.0–8.5)

## 2017-02-17 LAB — MICROALBUMIN, URINE: MICROALBUM., U, RANDOM: 23.5 ug/mL

## 2017-02-17 LAB — LIPID PANEL W/O CHOL/HDL RATIO
CHOLESTEROL TOTAL: 200 mg/dL — AB (ref 100–199)
HDL: 58 mg/dL (ref >39)
LDL Calculated: 117 mg/dL — ABNORMAL HIGH (ref 0–99)
Triglycerides: 127 mg/dL (ref 0–149)
VLDL CHOLESTEROL CAL: 25 mg/dL (ref 5–40)

## 2017-02-17 LAB — HGB A1C W/O EAG: Hgb A1c MFr Bld: 10.9 % — ABNORMAL HIGH (ref 4.8–5.6)

## 2017-02-17 LAB — T4, FREE: Free T4: 1.73 ng/dL (ref 0.82–1.77)

## 2017-02-17 LAB — SPECIMEN STATUS REPORT

## 2017-02-17 LAB — VITAMIN D 25 HYDROXY (VIT D DEFICIENCY, FRACTURES): VIT D 25 HYDROXY: 13.4 ng/mL — AB (ref 30.0–100.0)

## 2017-02-17 LAB — TSH: TSH: 1.92 u[IU]/mL (ref 0.450–4.500)

## 2017-02-24 ENCOUNTER — Encounter: Payer: Self-pay | Admitting: "Endocrinology

## 2017-02-24 ENCOUNTER — Ambulatory Visit (INDEPENDENT_AMBULATORY_CARE_PROVIDER_SITE_OTHER): Payer: Medicaid Other | Admitting: "Endocrinology

## 2017-02-24 VITALS — BP 150/86 | HR 106 | Ht 60.0 in | Wt 152.0 lb

## 2017-02-24 DIAGNOSIS — E1065 Type 1 diabetes mellitus with hyperglycemia: Secondary | ICD-10-CM

## 2017-02-24 DIAGNOSIS — E108 Type 1 diabetes mellitus with unspecified complications: Secondary | ICD-10-CM | POA: Diagnosis not present

## 2017-02-24 DIAGNOSIS — I10 Essential (primary) hypertension: Secondary | ICD-10-CM

## 2017-02-24 DIAGNOSIS — Z91199 Patient's noncompliance with other medical treatment and regimen due to unspecified reason: Secondary | ICD-10-CM

## 2017-02-24 DIAGNOSIS — IMO0002 Reserved for concepts with insufficient information to code with codable children: Secondary | ICD-10-CM

## 2017-02-24 DIAGNOSIS — Z9119 Patient's noncompliance with other medical treatment and regimen: Secondary | ICD-10-CM | POA: Diagnosis not present

## 2017-02-24 MED ORDER — INSULIN DETEMIR 100 UNIT/ML ~~LOC~~ SOLN: SUBCUTANEOUS | 2 refills | Status: DC

## 2017-02-24 NOTE — Progress Notes (Signed)
Subjective:    Patient ID: Katherine Patton, female    DOB: September 30, 1960. Patient is being seen in follow-up for management of diabetes requested by Bradd Burner, FNP.  Past Medical History:  Diagnosis Date  . Diabetes mellitus without complication ([place])   . Uterine mass    Past Surgical History:  Procedure Laterality Date  . CATARACT EXTRACTION     Social History   Socioeconomic History  . Marital status: Single    Spouse name: None  . Number of children: None  . Years of education: None  . Highest education level: None  Social Needs  . Financial resource strain: None  . Food insecurity - worry: None  . Food insecurity - inability: None  . Transportation needs - medical: None  . Transportation needs - non-medical: None  Occupational History  . None  Tobacco Use  . Smoking status: Never Smoker  . Smokeless tobacco: Never Used  Substance and Sexual Activity  . Alcohol use: No  . Drug use: No  . Sexual activity: Not Currently    Birth control/protection: None  Other Topics Concern  . None  Social History Narrative  . None   Outpatient Encounter Medications as of 02/24/2017  Medication Sig  . acetaminophen (TYLENOL) 500 MG tablet Take 500 mg by mouth every 6 (six) hours as needed for headache.  . gabapentin (NEURONTIN) 300 MG capsule Take 300 mg by mouth 2 (two) times daily.  Marland Kitchen glucose blood (ACCU-CHEK GUIDE) test strip Use as instructed 4 x daily. e11.65  . glucose blood (ACCU-CHEK GUIDE) test strip Use as instructed 4 x daily E10.65  . insulin aspart (NOVOLOG FLEXPEN) 100 UNIT/ML FlexPen Inject 15-21 Units into the skin 3 (three) times daily with meals.  . insulin detemir (LEVEMIR) 100 UNIT/ML injection 60 units daily at 8PM  . Insulin Pen Needle (PEN NEEDLES) 30G X 8 MM MISC 1 each 4 (four) times daily by Does not apply route.  Marland Kitchen lisinopril-hydrochlorothiazide (PRINZIDE,ZESTORETIC) 10-12.5 MG tablet Take 1 tablet by mouth daily. (Patient not taking: Reported on  02/24/2017)  . [DISCONTINUED] insulin detemir (LEVEMIR) 100 UNIT/ML injection 50 units daily at 8PM   No facility-administered encounter medications on file as of 02/24/2017.    ALLERGIES: No Known Allergies VACCINATION STATUS:  There is no immunization history on file for this patient.  Diabetes  She presents for her follow-up diabetic visit. She has type 1 (She is administratively classified as type 1 diabetes for management purposes.) diabetes mellitus. Onset time: She was diagnosed at approximate age of 51 years. Her disease course has been improving. There are no hypoglycemic associated symptoms. Pertinent negatives for hypoglycemia include no confusion, headaches, pallor or seizures. Associated symptoms include blurred vision. Pertinent negatives for diabetes include no chest pain, no fatigue, no polydipsia, no polyphagia, no polyuria and no weight loss. There are no hypoglycemic complications. Symptoms are improving. Diabetic complications include peripheral neuropathy. Risk factors for coronary artery disease include diabetes mellitus, sedentary lifestyle and hypertension. Current diabetic treatment includes insulin injections (She discloses today to me that she has been using Levemir from 2017.). She is compliant with treatment most of the time. Her weight is increasing steadily. She is following a generally unhealthy diet. When asked about meal planning, she reported none. She has had a previous visit with a dietitian. She never participates in exercise. Her home blood glucose trend is decreasing steadily. Her breakfast blood glucose range is generally >200 mg/dl. Her lunch blood glucose range is generally 180-200  mg/dl. Her dinner blood glucose range is generally 180-200 mg/dl. Her bedtime blood glucose range is generally 180-200 mg/dl. Her overall blood glucose range is 180-200 mg/dl. An ACE inhibitor/angiotensin II receptor blocker is being taken. Eye exam is current.  Hypertension  This is  a chronic problem. The current episode started more than 1 year ago. Associated symptoms include blurred vision. Pertinent negatives include no chest pain, headaches, palpitations or shortness of breath. Risk factors for coronary artery disease include diabetes mellitus, dyslipidemia and sedentary lifestyle. Past treatments include ACE inhibitors and beta blockers.    Review of Systems  Constitutional: Negative for chills, fatigue, fever, unexpected weight change and weight loss.  HENT: Negative for trouble swallowing and voice change.   Eyes: Positive for blurred vision. Negative for visual disturbance.  Respiratory: Negative for cough, shortness of breath and wheezing.   Cardiovascular: Negative for chest pain, palpitations and leg swelling.  Gastrointestinal: Negative for diarrhea, nausea and vomiting.  Endocrine: Negative for cold intolerance, heat intolerance, polydipsia, polyphagia and polyuria.  Musculoskeletal: Positive for gait problem. Negative for arthralgias and myalgias.  Skin: Negative for color change, pallor, rash and wound.  Neurological: Negative for seizures and headaches.  Psychiatric/Behavioral: Negative for confusion and suicidal ideas.    Objective:    BP (!) 150/86   Pulse (!) 106   Ht 5' (1.524 m)   Wt 152 lb (68.9 kg)   BMI 29.69 kg/m   Wt Readings from Last 3 Encounters:  02/24/17 152 lb (68.9 kg)  11/18/16 147 lb (66.7 kg)  07/23/16 135 lb (61.2 kg)    Physical Exam  Constitutional: She is oriented to person, place, and time.  HENT:  Head: Normocephalic and atraumatic.  Eyes: EOM are normal.  Neck: Normal range of motion. Neck supple. No tracheal deviation present. No thyromegaly present.  Cardiovascular:     Pulmonary/Chest: Effort normal and breath sounds normal.  Abdominal: Soft. Bowel sounds are normal. There is no tenderness. There is no guarding.  Musculoskeletal: She exhibits no edema.  Neurological: She is alert and oriented to person,  place, and time. She has normal reflexes. No cranial nerve deficit. Coordination normal.  Skin: Skin is warm and dry. No rash noted. No erythema. No pallor.  Psychiatric: She has a normal mood and affect. Judgment normal.    Recent Results (from the past 2160 hour(s))  Comprehensive metabolic panel     Status: Abnormal   Collection Time: 02/16/17  8:34 AM  Result Value Ref Range   Glucose 336 (H) 65 - 99 mg/dL   BUN 24 6 - 24 mg/dL   Creatinine, Ser 1.30 (H) 0.57 - 1.00 mg/dL   GFR calc non Af Amer 47 (L) >59 mL/min/1.73   GFR calc Af Amer 54 (L) >59 mL/min/1.73   BUN/Creatinine Ratio 18 9 - 23   Sodium 138 134 - 144 mmol/L   Potassium 4.2 3.5 - 5.2 mmol/L   Chloride 99 96 - 106 mmol/L   CO2 23 20 - 29 mmol/L   Calcium 9.1 8.7 - 10.2 mg/dL   Total Protein 7.2 6.0 - 8.5 g/dL   Albumin 4.1 3.5 - 5.5 g/dL   Globulin, Total 3.1 1.5 - 4.5 g/dL   Albumin/Globulin Ratio 1.3 1.2 - 2.2   Bilirubin Total 0.3 0.0 - 1.2 mg/dL   Alkaline Phosphatase 113 39 - 117 IU/L   AST 18 0 - 40 IU/L   ALT 16 0 - 32 IU/L  Lipid Panel w/o Chol/HDL Ratio  Status: Abnormal   Collection Time: 02/16/17  8:34 AM  Result Value Ref Range   Cholesterol, Total 200 (H) 100 - 199 mg/dL   Triglycerides 127 0 - 149 mg/dL   HDL 58 >39 mg/dL   VLDL Cholesterol Cal 25 5 - 40 mg/dL   LDL Calculated 117 (H) 0 - 99 mg/dL  Hgb A1c w/o eAG     Status: Abnormal   Collection Time: 02/16/17  8:34 AM  Result Value Ref Range   Hgb A1c MFr Bld 10.9 (H) 4.8 - 5.6 %    Comment:          Prediabetes: 5.7 - 6.4          Diabetes: >6.4          Glycemic control for adults with diabetes: <7.0   T4, free     Status: None   Collection Time: 02/16/17  8:34 AM  Result Value Ref Range   Free T4 1.73 0.82 - 1.77 ng/dL  TSH     Status: None   Collection Time: 02/16/17  8:34 AM  Result Value Ref Range   TSH 1.920 0.450 - 4.500 uIU/mL  VITAMIN D 25 Hydroxy (Vit-D Deficiency, Fractures)     Status: Abnormal   Collection Time:  02/16/17  8:34 AM  Result Value Ref Range   Vit D, 25-Hydroxy 13.4 (L) 30.0 - 100.0 ng/mL    Comment: Vitamin D deficiency has been defined by the [place] and an Endocrine Society practice guideline as a level of serum 25-OH vitamin D less than 20 ng/mL (1,2). The Endocrine Society went on to further define vitamin D insufficiency as a level between 21 and 29 ng/mL (2). 1. IOM (Institute of Medicine). 2010. Dietary reference    intakes for calcium and D. [place]: The    [employer]. 2. Holick MF, Binkley [place], Bischoff-Ferrari HA, et al.    Evaluation, treatment, and prevention of vitamin D    deficiency: an Endocrine Society clinical practice    guideline. JCEM. 2011 Jul; 96(7):1911-30.   Microalbumin, urine     Status: None   Collection Time: 02/16/17  8:34 AM  Result Value Ref Range   Microalbumin, Urine 23.5 Not Estab. ug/mL  Specimen status report     Status: None   Collection Time: 02/16/17  8:34 AM  Result Value Ref Range   specimen status report Comment     Comment: Isac Caddy CMP14 Default Isac Caddy CMP14 Default A hand-written panel/profile was received from your office. In accordance with the [facility] Ambiguous Test Code Policy dated July 9242, we have completed your order by using the closest currently or formerly recognized AMA panel.  We have assigned Comprehensive Metabolic Panel (14), Test Code #322000 to this request.  If this is not the testing you wished to receive on this specimen, please contact the [facility] Client Inquiry/Technical Services Department to clarify the test order.  We appreciate your business. Ambig Abbrev LP Default Ambig Abbrev LP Default A hand-written panel/profile was received from your office. In accordance with the [facility] Ambiguous Test Code Policy dated July 6834, we have completed your order by using the closest currently or formerly recognized AMA panel.  We have assigned Lipid Panel, Test Code  [id number] to this request. If this is not the testing you wished to receive on this specimen, plea se contact the [facility] Client Inquiry/Technical Services Department to clarify the test order.  We appreciate your business.  Assessment & Plan:    1. Uncontrolled type 1 diabetes mellitus with other specified complication [facility])  - Patient has currently uncontrolled symptomatic type 1 DM ( administrative reclassified as type I for management purposes) since  57 years of age. -  She came with higher A1c of 10.9%, increasing from 9.8%. ,  Better glucose profile.  She has had 15.6% A1c 2 years ago.  -She still has significantly above target fasting blood glucose profile despite basal/bolus insulin -mainly due to her continued consumption of large quantities of processed cheilitis in the evening hours, including soda.   - Her diabetes is complicated by neuropathy, patient remains at a high risk for more acute and chronic complications of diabetes which include CAD, CVA, CKD, retinopathy, and neuropathy. These are all discussed in detail with the patient.  - I have counseled the patient on diet management  by adopting a carbohydrate restricted/protein rich diet.   - I encouraged the patient to switch to  and consume more unprocessed or minimally processed complex starch and increased protein intake (animal or plant source), fruits, and vegetables.  - Patient is advised to stick to a routine mealtimes to eat 3 meals  a day and some light snacks, since she has a big weight deficit.    - I have approached patient with the following individualized plan to manage diabetes and patient agrees:   -  She remains noncompliant as far as dietary recommendations. She drinks a lot of soda as well as processed snacks and crackers unnecessarily.  - She will be treated exclusively with basal/bolus insulin . - I will increase her Levemir to 60 units daily at 8 PM, continue NovoLog at 15 units 3 times a day  before meals  for pre-meal blood glucose above 90 mg/dL, plus correction for pre-meal blood glucose above 150 mg/dL. - Patient is warned not to take insulin without proper monitoring per orders. -Adjustment parameters are given for hypo and hyperglycemia in writing. -Patient is encouraged to call clinic for blood glucose levels less than 70 or above 300 mg /dl.  - She is not a candidate for metformin, SGLT2 inhibitors, nor  incretin therapy.   - Patient specific target  A1c;  LDL, HDL, Triglycerides, and  Waist Circumference were discussed in detail.  2) BP/HTN: Her blood pressure is not controlled to target.  She is on lisinopril/HCTZ. I have advised her to continue on same for control of blood pressure to target.    3) Lipids/HPL:  Uncontrolled, LDL at 111. She will have fasting lipid panel before her next visit.    4) uterine leiomyoma: Large at 24 week. She has not kept her appointment with Dr. Glo Herring.  She was considered for hysterectomy, however she has been reluctant to have surgery. -I advised her to follow up with the plan.  5)  Weight/Diet:  She has appropriately gained 55 pounds so far good development for her.  CDE Consult in progress , exercise, and detailed carbohydrates information provided.  6) Chronic Care/Health Maintenance:  -Patient is on ACEI/ARB medications and encouraged to continue to follow up with Ophthalmology, Podiatrist at least yearly or according to recommendations, and advised to   stay away from smoking. I have recommended yearly flu vaccine and pneumonia vaccination at least every 5 years; moderate intensity exercise for up to 150 minutes weekly; and  sleep for at least 7 hours a day.  - I advised patient to maintain close follow up with House, Deliah Goody, FNP for  primary care needs.  - Time spent with the patient: 25 min, of which >50% was spent in reviewing her blood glucose logs , discussing her hypo- and hyper-glycemic episodes, reviewing her current and   previous labs and insulin doses and developing a plan to avoid hypo- and hyper-glycemia. Please refer to Patient Instructions for Blood Glucose Monitoring and Insulin/Medications Dosing Guide"  in media tab for additional information.  Follow up plan: - Return in about 3 months (around 05/24/2017) for meter, and logs.  Glade Lloyd, MD Phone: [phone]  Fax: [phone]   -  This note was partially dictated with voice recognition software. Similar sounding words can be transcribed inadequately or may not  be corrected upon review.  02/24/2017, 12:06 PM

## 2017-04-26 ENCOUNTER — Telehealth: Payer: Self-pay | Admitting: "Endocrinology

## 2017-04-26 MED ORDER — INSULIN ASPART 100 UNIT/ML FLEXPEN: 15.0000 [IU] | PEN_INJECTOR | Freq: Three times a day (TID) | SUBCUTANEOUS | 2 refills | Status: DC

## 2017-04-26 MED ORDER — INSULIN DETEMIR 100 UNIT/ML ~~LOC~~ SOLN: SUBCUTANEOUS | 2 refills | Status: DC

## 2017-04-26 MED ORDER — GLUCOSE BLOOD VI STRP: ORAL_STRIP | 5 refills | Status: DC

## 2017-04-26 NOTE — Telephone Encounter (Signed)
rx sent

## 2017-04-26 NOTE — Telephone Encounter (Signed)
Katherine Patton is asking for a refill on insulin aspart (NOVOLOG FLEXPEN) 100 UNIT/ML FlexPen, insulin detemir (LEVEMIR) 100 UNIT/ML injection, glucose blood (ACCU-CHEK GUIDE) test strips sent to [facility]

## 2017-04-27 ENCOUNTER — Other Ambulatory Visit: Payer: Self-pay

## 2017-04-27 MED ORDER — INSULIN DETEMIR 100 UNIT/ML FLEXPEN: 60.0000 [IU] | PEN_INJECTOR | Freq: Every day | SUBCUTANEOUS | 2 refills | Status: DC

## 2017-05-17 ENCOUNTER — Other Ambulatory Visit: Payer: Self-pay | Admitting: "Endocrinology

## 2017-05-18 LAB — COMPREHENSIVE METABOLIC PANEL
ALT: 19 IU/L (ref 0–32)
AST: 23 IU/L (ref 0–40)
Albumin/Globulin Ratio: 1.3 (ref 1.2–2.2)
Albumin: 4.1 g/dL (ref 3.5–5.5)
Alkaline Phosphatase: 136 IU/L — ABNORMAL HIGH (ref 39–117)
BUN/Creatinine Ratio: 18 (ref 9–23)
BUN: 28 mg/dL — AB (ref 6–24)
Bilirubin Total: 0.3 mg/dL (ref 0.0–1.2)
CALCIUM: 9.3 mg/dL (ref 8.7–10.2)
CHLORIDE: 97 mmol/L (ref 96–106)
CO2: 24 mmol/L (ref 20–29)
Creatinine, Ser: 1.55 mg/dL — ABNORMAL HIGH (ref 0.57–1.00)
GFR calc non Af Amer: 38 mL/min/{1.73_m2} — ABNORMAL LOW (ref >59)
GFR, EST AFRICAN AMERICAN: 43 mL/min/{1.73_m2} — AB (ref >59)
GLUCOSE: 335 mg/dL — AB (ref 65–99)
Globulin, Total: 3.1 g/dL (ref 1.5–4.5)
Potassium: 4.8 mmol/L (ref 3.5–5.2)
Sodium: 137 mmol/L (ref 134–144)
TOTAL PROTEIN: 7.2 g/dL (ref 6.0–8.5)

## 2017-05-18 LAB — SPECIMEN STATUS REPORT

## 2017-05-18 LAB — HGB A1C W/O EAG: Hgb A1c MFr Bld: 11.4 % — ABNORMAL HIGH (ref 4.8–5.6)

## 2017-05-26 ENCOUNTER — Encounter: Payer: Self-pay | Admitting: "Endocrinology

## 2017-05-26 ENCOUNTER — Ambulatory Visit (INDEPENDENT_AMBULATORY_CARE_PROVIDER_SITE_OTHER): Payer: Medicaid Other | Admitting: "Endocrinology

## 2017-05-26 VITALS — BP 154/90 | HR 90 | Ht 60.0 in | Wt 155.0 lb

## 2017-05-26 DIAGNOSIS — Z91199 Patient's noncompliance with other medical treatment and regimen due to unspecified reason: Secondary | ICD-10-CM

## 2017-05-26 DIAGNOSIS — Z9119 Patient's noncompliance with other medical treatment and regimen: Secondary | ICD-10-CM | POA: Diagnosis not present

## 2017-05-26 DIAGNOSIS — E108 Type 1 diabetes mellitus with unspecified complications: Secondary | ICD-10-CM | POA: Diagnosis not present

## 2017-05-26 DIAGNOSIS — I10 Essential (primary) hypertension: Secondary | ICD-10-CM | POA: Diagnosis not present

## 2017-05-26 DIAGNOSIS — IMO0002 Reserved for concepts with insufficient information to code with codable children: Secondary | ICD-10-CM

## 2017-05-26 DIAGNOSIS — E1065 Type 1 diabetes mellitus with hyperglycemia: Secondary | ICD-10-CM | POA: Diagnosis not present

## 2017-05-26 DIAGNOSIS — E782 Mixed hyperlipidemia: Secondary | ICD-10-CM

## 2017-05-26 MED ORDER — INSULIN DETEMIR 100 UNIT/ML ~~LOC~~ SOLN: SUBCUTANEOUS | 2 refills | Status: DC

## 2017-05-26 MED ORDER — ATORVASTATIN CALCIUM 10 MG PO TABS: 10.0000 mg | ORAL_TABLET | Freq: Every day | ORAL | 3 refills | Status: DC

## 2017-05-26 NOTE — Patient Instructions (Signed)

## 2017-05-26 NOTE — Progress Notes (Signed)
Subjective:    Patient ID: Katherine Patton, female    DOB: 01/06/1961. Patient is being seen in follow-up for management of diabetes requested by Bradd Burner, FNP.  Past Medical History:  Diagnosis Date  . Diabetes mellitus without complication ([place])   . Uterine mass    Past Surgical History:  Procedure Laterality Date  . CATARACT EXTRACTION     Social History   Socioeconomic History  . Marital status: Single    Spouse name: Not on file  . Number of children: Not on file  . Years of education: Not on file  . Highest education level: Not on file  Occupational History  . Not on file  Social Needs  . Financial resource strain: Not on file  . Food insecurity:    Worry: Not on file    Inability: Not on file  . Transportation needs:    Medical: Not on file    Non-medical: Not on file  Tobacco Use  . Smoking status: Never Smoker  . Smokeless tobacco: Never Used  Substance and Sexual Activity  . Alcohol use: No  . Drug use: No  . Sexual activity: Not Currently    Birth control/protection: None  Lifestyle  . Physical activity:    Days per week: Not on file    Minutes per session: Not on file  . Stress: Not on file  Relationships  . Social connections:    Talks on phone: Not on file    Gets together: Not on file    Attends religious service: Not on file    Active member of club or organization: Not on file    Attends meetings of clubs or organizations: Not on file    Relationship status: Not on file  Other Topics Concern  . Not on file  Social History Narrative  . Not on file   Outpatient Encounter Medications as of 05/26/2017  Medication Sig  . acetaminophen (TYLENOL) 500 MG tablet Take 500 mg by mouth every 6 (six) hours as needed for headache.  . gabapentin (NEURONTIN) 300 MG capsule Take 300 mg by mouth 2 (two) times daily.  Marland Kitchen glucose blood (ACCU-CHEK GUIDE) test strip Use as instructed 4 x daily E10.65  . insulin aspart (NOVOLOG FLEXPEN) 100 UNIT/ML  FlexPen Inject 15-21 Units into the skin 3 (three) times daily with meals.  . insulin detemir (LEVEMIR) 100 UNIT/ML injection 70 units daily at 8PM  . Insulin Pen Needle (PEN NEEDLES) 30G X 8 MM MISC 1 each 4 (four) times daily by Does not apply route.  Marland Kitchen lisinopril-hydrochlorothiazide (PRINZIDE,ZESTORETIC) 10-12.5 MG tablet Take 1 tablet by mouth daily. (Patient not taking: Reported on 02/24/2017)  . [DISCONTINUED] glucose blood (ACCU-CHEK GUIDE) test strip Use as instructed 4 x daily. e11.65  . [DISCONTINUED] insulin detemir (LEVEMIR) 100 UNIT/ML injection 60 units daily at 8PM  . [DISCONTINUED] Insulin Detemir (LEVEMIR) 100 UNIT/ML Pen Inject 60 Units into the skin at bedtime.   No facility-administered encounter medications on file as of 05/26/2017.    ALLERGIES: No Known Allergies VACCINATION STATUS:  There is no immunization history on file for this patient.  Diabetes  She presents for her follow-up diabetic visit. She has type 1 (She is administratively classified as type 1 diabetes for management purposes.) diabetes mellitus. Onset time: She was diagnosed at approximate age of 29 years. Her disease course has been worsening. There are no hypoglycemic associated symptoms. Pertinent negatives for hypoglycemia include no confusion, headaches, pallor or seizures. Associated symptoms  include polydipsia and polyuria. Pertinent negatives for diabetes include no blurred vision, no chest pain, no fatigue, no polyphagia and no weight loss. There are no hypoglycemic complications. Symptoms are worsening. Diabetic complications include peripheral neuropathy. Risk factors for coronary artery disease include diabetes mellitus, sedentary lifestyle and hypertension. Current diabetic treatment includes insulin injections (She discloses today to me that she has been using Levemir from 2017.). She is compliant with treatment most of the time. Her weight is increasing steadily. She is following a generally  unhealthy diet. When asked about meal planning, she reported none. She has had a previous visit with a dietitian. She never participates in exercise. Her home blood glucose trend is decreasing steadily. Her breakfast blood glucose range is generally >200 mg/dl. Her lunch blood glucose range is generally >200 mg/dl. Her dinner blood glucose range is generally >200 mg/dl. Her bedtime blood glucose range is generally >200 mg/dl. Her overall blood glucose range is >200 mg/dl. (She came with persistently above target blood glucose readings averaging 264 over the last 30 days.) An ACE inhibitor/angiotensin II receptor blocker is being taken. Eye exam is current.  Hypertension  This is a chronic problem. The current episode started more than 1 year ago. Pertinent negatives include no blurred vision, chest pain, headaches, palpitations or shortness of breath. Risk factors for coronary artery disease include diabetes mellitus, dyslipidemia, sedentary lifestyle and post-menopausal state. Past treatments include ACE inhibitors and beta blockers.    Review of Systems  Constitutional: Negative for chills, fatigue, fever, unexpected weight change and weight loss.  HENT: Negative for trouble swallowing and voice change.   Eyes: Negative for blurred vision and visual disturbance.  Respiratory: Negative for cough, shortness of breath and wheezing.   Cardiovascular: Negative for chest pain, palpitations and leg swelling.  Gastrointestinal: Negative for diarrhea, nausea and vomiting.  Endocrine: Positive for polydipsia and polyuria. Negative for cold intolerance, heat intolerance and polyphagia.  Musculoskeletal: Positive for gait problem. Negative for arthralgias and myalgias.  Skin: Negative for color change, pallor, rash and wound.  Neurological: Negative for seizures and headaches.  Psychiatric/Behavioral: Negative for confusion and suicidal ideas.    Objective:    BP (!) 154/90   Pulse 90   Ht 5' (1.524 m)    Wt 155 lb (70.3 kg)   BMI 30.27 kg/m   Wt Readings from Last 3 Encounters:  05/26/17 155 lb (70.3 kg)  02/24/17 152 lb (68.9 kg)  11/18/16 147 lb (66.7 kg)    Physical Exam  Constitutional: She is oriented to person, place, and time.  HENT:  Head: Normocephalic and atraumatic.  Eyes: EOM are normal.  Neck: Normal range of motion. Neck supple. No tracheal deviation present. No thyromegaly present.  Cardiovascular:     Pulmonary/Chest: Effort normal and breath sounds normal.  Abdominal: Soft. Bowel sounds are normal. There is no tenderness. There is no guarding.  Musculoskeletal: She exhibits no edema.  Neurological: She is alert and oriented to person, place, and time. She has normal reflexes. No cranial nerve deficit. Coordination normal.  Skin: Skin is warm and dry. No rash noted. No erythema. No pallor.  Psychiatric: She has a normal mood and affect. Judgment normal.    Recent Results (from the past 2160 hour(s))  Comprehensive metabolic panel     Status: Abnormal   Collection Time: 05/17/17  8:44 AM  Result Value Ref Range   Glucose 335 (H) 65 - 99 mg/dL   BUN 28 (H) 6 - 24 mg/dL  Creatinine, Ser 1.55 (H) 0.57 - 1.00 mg/dL   GFR calc non Af Amer 38 (L) >59 mL/min/1.73   GFR calc Af Amer 43 (L) >59 mL/min/1.73   BUN/Creatinine Ratio 18 9 - 23   Sodium 137 134 - 144 mmol/L   Potassium 4.8 3.5 - 5.2 mmol/L   Chloride 97 96 - 106 mmol/L   CO2 24 20 - 29 mmol/L   Calcium 9.3 8.7 - 10.2 mg/dL   Total Protein 7.2 6.0 - 8.5 g/dL   Albumin 4.1 3.5 - 5.5 g/dL   Globulin, Total 3.1 1.5 - 4.5 g/dL   Albumin/Globulin Ratio 1.3 1.2 - 2.2   Bilirubin Total 0.3 0.0 - 1.2 mg/dL   Alkaline Phosphatase 136 (H) 39 - 117 IU/L   AST 23 0 - 40 IU/L   ALT 19 0 - 32 IU/L  Hgb A1c w/o eAG     Status: Abnormal   Collection Time: 05/17/17  8:44 AM  Result Value Ref Range   Hgb A1c MFr Bld 11.4 (H) 4.8 - 5.6 %    Comment:          Prediabetes: 5.7 - 6.4          Diabetes: >6.4           Glycemic control for adults with diabetes: <7.0   Specimen status report     Status: None   Collection Time: 05/17/17  8:44 AM  Result Value Ref Range   specimen status report Comment     Comment: [facility] CMP14 Default Ambig Abbrev CMP14 Default A hand-written panel/profile was received from your office. In accordance with the [facility] Ambiguous Test Code Policy dated July 2993, we have completed your order by using the closest currently or formerly recognized AMA panel.  We have assigned Comprehensive Metabolic Panel (14), Test Code #322000 to this request.  If this is not the testing you wished to receive on this specimen, please contact the [facility] Client Inquiry/Technical Services Department to clarify the test order.  We appreciate your business.     Assessment & Plan:    1. Uncontrolled type 1 diabetes mellitus with other specified complication [facility])  - Patient has currently uncontrolled symptomatic type 1 DM ( administrative reclassified as type I for management purposes) since  57 years of age. -  She came with higher average blood glucose of 264 and higher A1c of 11.4%, slowly increasing from 9.8%.  This is due to her continued indiscretion for large quantities of soda.  She has had 15.6% A1c 2 years ago.   - Her diabetes is complicated by neuropathy, patient remains at a high risk for more acute and chronic complications of diabetes which include CAD, CVA, CKD, retinopathy, and neuropathy. These are all discussed in detail with the patient.  - I have counseled the patient on diet management  by adopting a carbohydrate restricted/protein rich diet.  -  Suggestion is made for her to avoid simple carbohydrates  from her diet including Cakes, Sweet Desserts / Pastries, Ice Cream, Soda (diet and regular), Sweet Tea, Candies, Chips, Cookies, Store Bought Juices, Alcohol in Excess of  1-2 drinks a day, Artificial Sweeteners, and "Sugar-free" Products. This will help patient to  have stable blood glucose profile and potentially avoid unintended weight gain.   - I encouraged the patient to switch to  and consume more unprocessed or minimally processed complex starch and increased protein intake (animal or plant source), fruits, and vegetables.  - Patient is advised  to stick to a routine mealtimes to eat 3 meals  a day and some light snacks, since she has a big weight deficit.    - I have approached patient with the following individualized plan to manage diabetes and patient agrees:   -  She remains noncompliant as far as dietary recommendations. She drinks a lot of soda as well as processed snacks and crackers unnecessarily.  - She will be treated exclusively with basal/bolus insulin . - I will increase her Levemir to 70 units daily at 8 PM, continue NovoLog at 15 units 3 times a day before meals  for pre-meal blood glucose above 90 mg/dL, plus correction for pre-meal blood glucose above 150 mg/dL. - Patient is warned not to take insulin without proper monitoring per orders. -Adjustment parameters are given for hypo and hyperglycemia in writing. -Patient is encouraged to call clinic for blood glucose levels less than 70 or above 300 mg /dl.  - She is not a candidate for metformin, SGLT2 inhibitors, nor  incretin therapy.  - Patient specific target  A1c;  LDL, HDL, Triglycerides, and  Waist Circumference were discussed in detail.  2) BP/HTN: Her blood pressure is not controlled to target.  She is on lisinopril/HCTZ. I have advised her to continue on same for control of blood pressure to target.   3) Lipids/HPL:  Uncontrolled, LDL at 117.  I will initiate a low-dose atorvastatin at 10 mg p.o. nightly.    4) uterine leiomyoma: Large at 24 week. She has not kept her appointment with Dr. Glo Herring.  She was considered for hysterectomy, however she has been reluctant to have surgery. -I advised her to follow up with the plan.  5)  Weight/Diet:  She has appropriately  gained 55 pounds so far good development for her.  CDE Consult in progress , exercise, and detailed carbohydrates information provided.  6) Chronic Care/Health Maintenance:  -Patient is on ACEI/ARB medications and encouraged to continue to follow up with Ophthalmology, Podiatrist at least yearly or according to recommendations, and advised to   stay away from smoking. I have recommended yearly flu vaccine and pneumonia vaccination at least every 5 years; moderate intensity exercise for up to 150 minutes weekly; and  sleep for at least 7 hours a day.  - I advised patient to maintain close follow up with House, Deliah Goody, FNP for primary care needs.  - Time spent with the patient: 25 min, of which >50% was spent in reviewing her blood glucose logs , discussing her hypo- and hyper-glycemic episodes, reviewing her current and  previous labs and insulin doses and developing a plan to avoid hypo- and hyper-glycemia. Please refer to Patient Instructions for Blood Glucose Monitoring and Insulin/Medications Dosing Guide"  in media tab for additional information. [name] participated in the discussions, expressed understanding, and voiced agreement with the above plans.  All questions were answered to her satisfaction. she is encouraged to contact clinic should she have any questions or concerns prior to her return visit.  Follow up plan: - Return in about 3 months (around 08/26/2017) for meter, and logs.  Glade Lloyd, MD Phone: [phone]  Fax: [phone]   -  This note was partially dictated with voice recognition software. Similar sounding words can be transcribed inadequately or may not  be corrected upon review.  05/26/2017, 9:03 AM

## 2017-08-17 ENCOUNTER — Other Ambulatory Visit: Payer: Self-pay | Admitting: "Endocrinology

## 2017-08-18 LAB — COMPREHENSIVE METABOLIC PANEL
A/G RATIO: 1.4 (ref 1.2–2.2)
ALK PHOS: 124 IU/L — AB (ref 39–117)
ALT: 13 IU/L (ref 0–32)
AST: 16 IU/L (ref 0–40)
Albumin: 4.2 g/dL (ref 3.5–5.5)
BILIRUBIN TOTAL: 0.2 mg/dL (ref 0.0–1.2)
BUN/Creatinine Ratio: 21 (ref 9–23)
BUN: 28 mg/dL — AB (ref 6–24)
CHLORIDE: 102 mmol/L (ref 96–106)
CO2: 22 mmol/L (ref 20–29)
Calcium: 9.3 mg/dL (ref 8.7–10.2)
Creatinine, Ser: 1.33 mg/dL — ABNORMAL HIGH (ref 0.57–1.00)
GFR calc Af Amer: 52 mL/min/{1.73_m2} — ABNORMAL LOW (ref >59)
GFR calc non Af Amer: 45 mL/min/{1.73_m2} — ABNORMAL LOW (ref >59)
GLUCOSE: 209 mg/dL — AB (ref 65–99)
Globulin, Total: 3.1 g/dL (ref 1.5–4.5)
POTASSIUM: 4.5 mmol/L (ref 3.5–5.2)
Sodium: 140 mmol/L (ref 134–144)
Total Protein: 7.3 g/dL (ref 6.0–8.5)

## 2017-08-18 LAB — SPECIMEN STATUS REPORT

## 2017-08-18 LAB — HGB A1C W/O EAG: Hgb A1c MFr Bld: 11.3 % — ABNORMAL HIGH (ref 4.8–5.6)

## 2017-08-25 ENCOUNTER — Ambulatory Visit (INDEPENDENT_AMBULATORY_CARE_PROVIDER_SITE_OTHER): Payer: Medicaid Other | Admitting: "Endocrinology

## 2017-08-25 ENCOUNTER — Encounter: Payer: Self-pay | Admitting: "Endocrinology

## 2017-08-25 VITALS — BP 142/90 | HR 90 | Ht 60.0 in | Wt 159.0 lb

## 2017-08-25 DIAGNOSIS — E108 Type 1 diabetes mellitus with unspecified complications: Secondary | ICD-10-CM | POA: Diagnosis not present

## 2017-08-25 DIAGNOSIS — E782 Mixed hyperlipidemia: Secondary | ICD-10-CM

## 2017-08-25 DIAGNOSIS — E1065 Type 1 diabetes mellitus with hyperglycemia: Secondary | ICD-10-CM

## 2017-08-25 DIAGNOSIS — I10 Essential (primary) hypertension: Secondary | ICD-10-CM | POA: Diagnosis not present

## 2017-08-25 DIAGNOSIS — Z91199 Patient's noncompliance with other medical treatment and regimen due to unspecified reason: Secondary | ICD-10-CM

## 2017-08-25 DIAGNOSIS — Z9119 Patient's noncompliance with other medical treatment and regimen: Secondary | ICD-10-CM | POA: Diagnosis not present

## 2017-08-25 DIAGNOSIS — E559 Vitamin D deficiency, unspecified: Secondary | ICD-10-CM | POA: Insufficient documentation

## 2017-08-25 DIAGNOSIS — IMO0002 Reserved for concepts with insufficient information to code with codable children: Secondary | ICD-10-CM

## 2017-08-25 MED ORDER — CHOLECALCIFEROL 50 MCG (2000 UT) PO CAPS: 1.0000 | ORAL_CAPSULE | Freq: Every day | ORAL | 6 refills | Status: AC

## 2017-08-25 MED ORDER — GLUCOSE BLOOD VI STRP: ORAL_STRIP | 5 refills | Status: DC

## 2017-08-25 MED ORDER — INSULIN ASPART 100 UNIT/ML FLEXPEN: 15.0000 [IU] | PEN_INJECTOR | Freq: Three times a day (TID) | SUBCUTANEOUS | 2 refills | Status: DC

## 2017-08-25 NOTE — Patient Instructions (Signed)

## 2017-08-25 NOTE — Progress Notes (Signed)
Endocrinology follow-up note  Subjective:    Patient ID: Katherine Patton, female    DOB: 1960-04-29. Patient is being seen in follow-up for management of diabetes requested by Bradd Burner, FNP.  Past Medical History:  Diagnosis Date  . Diabetes mellitus without complication ([place])   . Uterine mass    Past Surgical History:  Procedure Laterality Date  . CATARACT EXTRACTION     Social History   Socioeconomic History  . Marital status: Single    Spouse name: Not on file  . Number of children: Not on file  . Years of education: Not on file  . Highest education level: Not on file  Occupational History  . Not on file  Social Needs  . Financial resource strain: Not on file  . Food insecurity:    Worry: Not on file    Inability: Not on file  . Transportation needs:    Medical: Not on file    Non-medical: Not on file  Tobacco Use  . Smoking status: Never Smoker  . Smokeless tobacco: Never Used  Substance and Sexual Activity  . Alcohol use: No  . Drug use: No  . Sexual activity: Not Currently    Birth control/protection: None  Lifestyle  . Physical activity:    Days per week: Not on file    Minutes per session: Not on file  . Stress: Not on file  Relationships  . Social connections:    Talks on phone: Not on file    Gets together: Not on file    Attends religious service: Not on file    Active member of club or organization: Not on file    Attends meetings of clubs or organizations: Not on file    Relationship status: Not on file  Other Topics Concern  . Not on file  Social History Narrative  . Not on file   Outpatient Encounter Medications as of 08/25/2017  Medication Sig  . acetaminophen (TYLENOL) 500 MG tablet Take 500 mg by mouth every 6 (six) hours as needed for headache.  Marland Kitchen atorvastatin (LIPITOR) 10 MG tablet Take 1 tablet (10 mg total) by mouth daily.  . Cholecalciferol 2000 units CAPS Take 1 capsule (2,000 Units total) by mouth daily with breakfast.  .  gabapentin (NEURONTIN) 300 MG capsule Take 300 mg by mouth 2 (two) times daily.  Marland Kitchen glucose blood (ACCU-CHEK GUIDE) test strip Use as instructed 4 x daily E10.65  . insulin aspart (NOVOLOG FLEXPEN) 100 UNIT/ML FlexPen Inject 15-21 Units into the skin 3 (three) times daily with meals.  . insulin detemir (LEVEMIR) 100 UNIT/ML injection 70 units daily at 8PM  . Insulin Pen Needle (PEN NEEDLES) 30G X 8 MM MISC 1 each 4 (four) times daily by Does not apply route.  Marland Kitchen lisinopril-hydrochlorothiazide (PRINZIDE,ZESTORETIC) 10-12.5 MG tablet Take 1 tablet by mouth daily.  . [DISCONTINUED] glucose blood (ACCU-CHEK GUIDE) test strip Use as instructed 4 x daily E10.65  . [DISCONTINUED] insulin aspart (NOVOLOG FLEXPEN) 100 UNIT/ML FlexPen Inject 15-21 Units into the skin 3 (three) times daily with meals.   No facility-administered encounter medications on file as of 08/25/2017.    ALLERGIES: No Known Allergies VACCINATION STATUS:  There is no immunization history on file for this patient.  Diabetes  She presents for her follow-up diabetic visit. She has type 1 (She is administratively classified as type 1 diabetes for management purposes.) diabetes mellitus. Onset time: She was diagnosed at approximate age of 32 years. Her disease course has been  worsening. There are no hypoglycemic associated symptoms. Pertinent negatives for hypoglycemia include no confusion, headaches, pallor or seizures. Associated symptoms include polydipsia and polyuria. Pertinent negatives for diabetes include no blurred vision, no chest pain, no fatigue, no polyphagia and no weight loss. There are no hypoglycemic complications. Symptoms are worsening. Diabetic complications include peripheral neuropathy. Risk factors for coronary artery disease include diabetes mellitus, sedentary lifestyle and hypertension. Current diabetic treatment includes insulin injections (She discloses today to me that she has been using Levemir from 2017.). She is  compliant with treatment most of the time. Her weight is increasing steadily. She is following a generally unhealthy diet. When asked about meal planning, she reported none. She has had a previous visit with a dietitian. She never participates in exercise. Her home blood glucose trend is decreasing steadily. Her breakfast blood glucose range is generally >200 mg/dl. Her lunch blood glucose range is generally >200 mg/dl. Her dinner blood glucose range is generally >200 mg/dl. Her bedtime blood glucose range is generally >200 mg/dl. Her overall blood glucose range is >200 mg/dl. An ACE inhibitor/angiotensin II receptor blocker is being taken. Eye exam is current.  Hypertension  This is a chronic problem. The current episode started more than 1 year ago. Pertinent negatives include no blurred vision, chest pain, headaches, palpitations or shortness of breath. Risk factors for coronary artery disease include diabetes mellitus, dyslipidemia, sedentary lifestyle and post-menopausal state. Past treatments include ACE inhibitors and beta blockers.    Review of Systems  Constitutional: Negative for chills, fatigue, fever, unexpected weight change and weight loss.  HENT: Negative for trouble swallowing and voice change.   Eyes: Negative for blurred vision and visual disturbance.  Respiratory: Negative for cough, shortness of breath and wheezing.   Cardiovascular: Negative for chest pain, palpitations and leg swelling.  Gastrointestinal: Negative for diarrhea, nausea and vomiting.  Endocrine: Positive for polydipsia and polyuria. Negative for cold intolerance, heat intolerance and polyphagia.  Musculoskeletal: Positive for gait problem. Negative for arthralgias and myalgias.  Skin: Negative for color change, pallor, rash and wound.  Neurological: Negative for seizures and headaches.  Psychiatric/Behavioral: Negative for confusion and suicidal ideas.    Objective:    BP (!) 142/90   Pulse 90   Ht 5'  (1.524 m)   Wt 159 lb (72.1 kg)   BMI 31.05 kg/m   Wt Readings from Last 3 Encounters:  08/25/17 159 lb (72.1 kg)  05/26/17 155 lb (70.3 kg)  02/24/17 152 lb (68.9 kg)    Physical Exam  Constitutional: She is oriented to person, place, and time.  HENT:  Head: Normocephalic and atraumatic.  Eyes: EOM are normal.  Neck: Normal range of motion. Neck supple. No tracheal deviation present. No thyromegaly present.  Cardiovascular:     Pulmonary/Chest: Effort normal and breath sounds normal.  Abdominal: Soft. Bowel sounds are normal. There is no tenderness. There is no guarding.  Musculoskeletal: She exhibits no edema.  Neurological: She is alert and oriented to person, place, and time. She has normal reflexes. No cranial nerve deficit. Coordination normal.  Skin: Skin is warm and dry. No rash noted. No erythema. No pallor.  Psychiatric: She has a normal mood and affect. Judgment normal.    Recent Results (from the past 2160 hour(s))  Comprehensive metabolic panel     Status: Abnormal   Collection Time: 08/17/17  8:10 AM  Result Value Ref Range   Glucose 209 (H) 65 - 99 mg/dL   BUN 28 (H) 6 -  24 mg/dL   Creatinine, Ser 1.33 (H) 0.57 - 1.00 mg/dL   GFR calc non Af Amer 45 (L) >59 mL/min/1.73   GFR calc Af Amer 52 (L) >59 mL/min/1.73   BUN/Creatinine Ratio 21 9 - 23   Sodium 140 134 - 144 mmol/L   Potassium 4.5 3.5 - 5.2 mmol/L   Chloride 102 96 - 106 mmol/L   CO2 22 20 - 29 mmol/L   Calcium 9.3 8.7 - 10.2 mg/dL   Total Protein 7.3 6.0 - 8.5 g/dL   Albumin 4.2 3.5 - 5.5 g/dL   Globulin, Total 3.1 1.5 - 4.5 g/dL   Albumin/Globulin Ratio 1.4 1.2 - 2.2   Bilirubin Total 0.2 0.0 - 1.2 mg/dL   Alkaline Phosphatase 124 (H) 39 - 117 IU/L   AST 16 0 - 40 IU/L   ALT 13 0 - 32 IU/L  Hgb A1c w/o eAG     Status: Abnormal   Collection Time: 08/17/17  8:10 AM  Result Value Ref Range   Hgb A1c MFr Bld 11.3 (H) 4.8 - 5.6 %    Comment:          Prediabetes: 5.7 - 6.4          Diabetes:  >6.4          Glycemic control for adults with diabetes: <7.0   Specimen status report     Status: None   Collection Time: 08/17/17  8:10 AM  Result Value Ref Range   specimen status report Comment     Comment: Isac Caddy CMP14 Default Ambig Abbrev CMP14 Default A hand-written panel/profile was received from your office. In accordance with the [facility] Ambiguous Test Code Policy dated July 1017, we have completed your order by using the closest currently or formerly recognized AMA panel.  We have assigned Comprehensive Metabolic Panel (14), Test Code #322000 to this request.  If this is not the testing you wished to receive on this specimen, please contact the [place] Client Inquiry/Technical Services Department to clarify the test order.  We appreciate your business.     Assessment & Plan:    1. Uncontrolled type 1 diabetes mellitus with other specified complication [facility])  - Patient has currently uncontrolled symptomatic type 1 DM ( administrative reclassified as type I for management purposes) since  57 years of age. -She returns with persistent hyperglycemia with A1c of 11.3% unchanged from her last visit.   -This is mainly due to the fact that she misses her basal insulin 3 to 4 days out of 7 for fear of hypoglycemia.  She did not document or reports major hypoglycemia.  -She admits to dietary indiscretion including consumption of large quantities of soda in the evening hours.    - Her diabetes is complicated by neuropathy, patient remains at a high risk for more acute and chronic complications of diabetes which include CAD, CVA, CKD, retinopathy, and neuropathy. These are all discussed in detail with the patient.  - I have counseled the patient on diet management  by adopting a carbohydrate restricted/protein rich diet.  -  Suggestion is made for her to avoid simple carbohydrates  from her diet including Cakes, Sweet Desserts / Pastries, Ice Cream, Soda (diet and regular),  Sweet Tea, Candies, Chips, Cookies, Store Bought Juices, Alcohol in Excess of  1-2 drinks a day, Artificial Sweeteners, and "Sugar-free" Products. This will help patient to have stable blood glucose profile and potentially avoid unintended weight gain.   - I encouraged the patient  to switch to  and consume more unprocessed or minimally processed complex starch and increased protein intake (animal or plant source), fruits, and vegetables.  - Patient is advised to stick to a routine mealtimes to eat 3 meals  a day and some light snacks, since she has a big weight deficit.    - I have approached patient with the following individualized plan to manage diabetes and patient agrees:   -  She remains noncompliant as far as dietary recommendations and insulin administration.  She drinks a lot of soda as well as processed snacks and crackers unnecessarily.  She wishes to keep her blood glucose close to 200 , because she does not "feel well" if it drops to below 200 mg/dL.  - She will be treated exclusively with basal/bolus insulin . - I approached her to remain on Levemir 70 units nightly at 8 PM to avoid risk of omission, continue NovoLog 15 units 3 times a day before meals  for pre-meal blood glucose above 90 mg/dL, plus correction for pre-meal blood glucose above 150 mg/dL. - Patient is warned not to take insulin without proper monitoring per orders. -Adjustment parameters are given for hypo and hyperglycemia in writing. -Patient is encouraged to call clinic for blood glucose levels less than 70 or above 300 mg /dl.  - She is not a candidate for metformin, SGLT2 inhibitors, nor  incretin therapy.  - Patient specific target  A1c;  LDL, HDL, Triglycerides, and  Waist Circumference were discussed in detail.  2) BP/HTN: Her blood pressure is not controlled to target.  She is on lisinopril/HCTZ. I have advised her to continue on same for control of blood pressure to target.   3) Lipids/HPL: Her recent  lipid panel showed uncontrolled  LDL at 117.  She was initiated on atorvastatin 10 mg p.o. nightly.    4) uterine leiomyoma: Large at 24 week. She has not kept her appointment with Dr. Glo Herring.  She was considered for hysterectomy, however she has been reluctant to have surgery. -I advised her to follow up with the plan.  5)  Weight/Diet:  She has appropriately gained 60 pounds so far good development for her.  2 years ago, she did have significant weight deficit.  CDE Consult in progress , exercise, and detailed carbohydrates information provided.  6) Vitamin D deficiency.  She is on ongoing supplement with cholecalciferol 2000 units daily.   7) Chronic Care/Health Maintenance:  -Patient is on ACEI/ARB medications and encouraged to continue to follow up with Ophthalmology, Podiatrist at least yearly or according to recommendations, and advised to   stay away from smoking. I have recommended yearly flu vaccine and pneumonia vaccination at least every 5 years; moderate intensity exercise for up to 150 minutes weekly; and  sleep for at least 7 hours a day.  - I advised patient to maintain close follow up with House, Deliah Goody, FNP for primary care needs.  - Time spent with the patient: 25 min, of which >50% was spent in reviewing her blood glucose logs , discussing her hypo- and hyper-glycemic episodes, reviewing her current and  previous labs and insulin doses and developing a plan to avoid hypo- and hyper-glycemia. Please refer to Patient Instructions for Blood Glucose Monitoring and Insulin/Medications Dosing Guide"  in media tab for additional information. Katherine Patton participated in the discussions, expressed understanding, and voiced agreement with the above plans.  All questions were answered to her satisfaction. she is encouraged to contact clinic should she have  any questions or concerns prior to her return visit.   Follow up plan: - Return in about 3 months (around 11/25/2017) for  Follow up with Pre-visit Labs, Meter, and Logs.  Glade Lloyd, MD Phone: [phone]  Fax: [phone]   -  This note was partially dictated with voice recognition software. Similar sounding words can be transcribed inadequately or may not  be corrected upon review.  08/25/2017, 5:50 PM

## 2017-09-29 ENCOUNTER — Other Ambulatory Visit: Payer: Self-pay | Admitting: "Endocrinology

## 2017-12-01 ENCOUNTER — Ambulatory Visit: Payer: Medicaid Other | Admitting: "Endocrinology

## 2018-02-01 ENCOUNTER — Other Ambulatory Visit: Payer: Self-pay | Admitting: "Endocrinology

## 2018-02-02 LAB — COMPREHENSIVE METABOLIC PANEL
ALBUMIN: 4 g/dL (ref 3.8–4.9)
ALT: 14 IU/L (ref 0–32)
AST: 18 IU/L (ref 0–40)
Albumin/Globulin Ratio: 1.3 (ref 1.2–2.2)
Alkaline Phosphatase: 109 IU/L (ref 39–117)
BUN/Creatinine Ratio: 12 (ref 9–23)
BUN: 19 mg/dL (ref 6–24)
Bilirubin Total: 0.3 mg/dL (ref 0.0–1.2)
CO2: 24 mmol/L (ref 20–29)
Calcium: 9 mg/dL (ref 8.7–10.2)
Chloride: 97 mmol/L (ref 96–106)
Creatinine, Ser: 1.59 mg/dL — ABNORMAL HIGH (ref 0.57–1.00)
GFR calc Af Amer: 42 mL/min/{1.73_m2} — ABNORMAL LOW (ref >59)
GFR, EST NON AFRICAN AMERICAN: 36 mL/min/{1.73_m2} — AB (ref >59)
Globulin, Total: 3 g/dL (ref 1.5–4.5)
Glucose: 389 mg/dL — ABNORMAL HIGH (ref 65–99)
Potassium: 4.4 mmol/L (ref 3.5–5.2)
Sodium: 139 mmol/L (ref 134–144)
Total Protein: 7 g/dL (ref 6.0–8.5)

## 2018-02-02 LAB — HGB A1C W/O EAG: Hgb A1c MFr Bld: 11.2 % — ABNORMAL HIGH (ref 4.8–5.6)

## 2018-02-08 ENCOUNTER — Other Ambulatory Visit: Payer: Self-pay | Admitting: "Endocrinology

## 2018-02-08 ENCOUNTER — Ambulatory Visit (INDEPENDENT_AMBULATORY_CARE_PROVIDER_SITE_OTHER): Payer: Medicaid Other | Admitting: "Endocrinology

## 2018-02-08 ENCOUNTER — Encounter: Payer: Self-pay | Admitting: "Endocrinology

## 2018-02-08 VITALS — BP 150/94 | HR 92 | Ht 60.0 in | Wt 159.0 lb

## 2018-02-08 DIAGNOSIS — E108 Type 1 diabetes mellitus with unspecified complications: Secondary | ICD-10-CM | POA: Diagnosis not present

## 2018-02-08 DIAGNOSIS — E1065 Type 1 diabetes mellitus with hyperglycemia: Secondary | ICD-10-CM

## 2018-02-08 DIAGNOSIS — Z9119 Patient's noncompliance with other medical treatment and regimen: Secondary | ICD-10-CM

## 2018-02-08 DIAGNOSIS — E782 Mixed hyperlipidemia: Secondary | ICD-10-CM

## 2018-02-08 DIAGNOSIS — I10 Essential (primary) hypertension: Secondary | ICD-10-CM

## 2018-02-08 DIAGNOSIS — Z91199 Patient's noncompliance with other medical treatment and regimen due to unspecified reason: Secondary | ICD-10-CM

## 2018-02-08 DIAGNOSIS — IMO0002 Reserved for concepts with insufficient information to code with codable children: Secondary | ICD-10-CM

## 2018-02-08 MED ORDER — INSULIN DETEMIR 100 UNIT/ML ~~LOC~~ SOLN: SUBCUTANEOUS | 3 refills | Status: DC

## 2018-02-08 MED ORDER — INSULIN DETEMIR 100 UNIT/ML FLEXPEN: 70.0000 [IU] | PEN_INJECTOR | Freq: Every day | SUBCUTANEOUS | 3 refills | Status: DC

## 2018-02-08 NOTE — Patient Instructions (Signed)

## 2018-02-08 NOTE — Progress Notes (Signed)
Endocrinology follow-up note  Subjective:    Patient ID: Katherine Patton, female    DOB: 12-Sep-1960. Patient is being seen in follow-up for management of chronically uncontrolled diabetes requested by Bradd Burner, FNP.  Past Medical History:  Diagnosis Date  . Diabetes mellitus without complication ([place])   . Uterine mass    Past Surgical History:  Procedure Laterality Date  . CATARACT EXTRACTION     Social History   Socioeconomic History  . Marital status: Single    Spouse name: Not on file  . Number of children: Not on file  . Years of education: Not on file  . Highest education level: Not on file  Occupational History  . Not on file  Social Needs  . Financial resource strain: Not on file  . Food insecurity:    Worry: Not on file    Inability: Not on file  . Transportation needs:    Medical: Not on file    Non-medical: Not on file  Tobacco Use  . Smoking status: Never Smoker  . Smokeless tobacco: Never Used  Substance and Sexual Activity  . Alcohol use: No  . Drug use: No  . Sexual activity: Not Currently    Birth control/protection: None  Lifestyle  . Physical activity:    Days per week: Not on file    Minutes per session: Not on file  . Stress: Not on file  Relationships  . Social connections:    Talks on phone: Not on file    Gets together: Not on file    Attends religious service: Not on file    Active member of club or organization: Not on file    Attends meetings of clubs or organizations: Not on file    Relationship status: Not on file  Other Topics Concern  . Not on file  Social History Narrative  . Not on file   Outpatient Encounter Medications as of 02/08/2018  Medication Sig  . acetaminophen (TYLENOL) 500 MG tablet Take 500 mg by mouth every 6 (six) hours as needed for headache.  Marland Kitchen atorvastatin (LIPITOR) 10 MG tablet Take 1 tablet (10 mg total) by mouth daily.  . Cholecalciferol 2000 units CAPS Take 1 capsule (2,000 Units total) by mouth  daily with breakfast.  . gabapentin (NEURONTIN) 300 MG capsule Take 300 mg by mouth 2 (two) times daily.  Marland Kitchen glucose blood (ACCU-CHEK GUIDE) test strip Use as instructed 4 x daily E10.65  . insulin aspart (NOVOLOG FLEXPEN) 100 UNIT/ML FlexPen Inject 15-21 Units into the skin 3 (three) times daily with meals.  . Insulin Detemir (LEVEMIR FLEXTOUCH) 100 UNIT/ML Pen Inject 70 Units into the skin at bedtime.  . Insulin Pen Needle (PEN NEEDLES) 30G X 8 MM MISC 1 each 4 (four) times daily by Does not apply route.  Marland Kitchen lisinopril-hydrochlorothiazide (PRINZIDE,ZESTORETIC) 10-12.5 MG tablet Take 1 tablet by mouth daily.  . [DISCONTINUED] insulin detemir (LEVEMIR) 100 UNIT/ML injection 70 units daily at 8PM  . [DISCONTINUED] insulin detemir (LEVEMIR) 100 UNIT/ML injection 70 units daily at 8PM  . [DISCONTINUED] LEVEMIR FLEXTOUCH 100 UNIT/ML Pen  INJECT 50 UNITS SUBCUTANEOUSLY DAILY AT 8PM (Patient not taking: Reported on 02/08/2018)   No facility-administered encounter medications on file as of 02/08/2018.    ALLERGIES: No Known Allergies VACCINATION STATUS:  There is no immunization history on file for this patient.  Diabetes  She presents for her follow-up diabetic visit. She has type 1 (She is administratively classified as type 1 diabetes for management purposes.)  diabetes mellitus. Onset time: She was diagnosed at approximate age of 3 years. Her disease course has been worsening. There are no hypoglycemic associated symptoms. Pertinent negatives for hypoglycemia include no confusion, headaches, pallor or seizures. Associated symptoms include polydipsia and polyuria. Pertinent negatives for diabetes include no blurred vision, no chest pain, no fatigue, no polyphagia and no weight loss. There are no hypoglycemic complications. Symptoms are worsening. Diabetic complications include peripheral neuropathy. Risk factors for coronary artery disease include diabetes mellitus, sedentary lifestyle and hypertension.  Current diabetic treatment includes insulin injections (She discloses today to me that she has been using Levemir from 2017.). She is compliant with treatment most of the time. Her weight is fluctuating minimally. She is following a generally unhealthy diet. When asked about meal planning, she reported none. She has had a previous visit with a dietitian. She never participates in exercise. Her home blood glucose trend is decreasing steadily. Her breakfast blood glucose range is generally >200 mg/dl. Her lunch blood glucose range is generally >200 mg/dl. Her dinner blood glucose range is generally >200 mg/dl. Her bedtime blood glucose range is generally >200 mg/dl. Her overall blood glucose range is >200 mg/dl. An ACE inhibitor/angiotensin II receptor blocker is being taken. Eye exam is current.  Hypertension  This is a chronic problem. The current episode started more than 1 year ago. Pertinent negatives include no blurred vision, chest pain, headaches, palpitations or shortness of breath. Risk factors for coronary artery disease include diabetes mellitus, dyslipidemia, sedentary lifestyle and post-menopausal state. Past treatments include ACE inhibitors and beta blockers.    Review of Systems  Constitutional: Negative for chills, fatigue, fever, unexpected weight change and weight loss.  HENT: Negative for trouble swallowing and voice change.   Eyes: Negative for blurred vision and visual disturbance.  Respiratory: Negative for cough, shortness of breath and wheezing.   Cardiovascular: Negative for chest pain, palpitations and leg swelling.  Gastrointestinal: Negative for diarrhea, nausea and vomiting.  Endocrine: Positive for polydipsia and polyuria. Negative for cold intolerance, heat intolerance and polyphagia.  Musculoskeletal: Positive for gait problem. Negative for arthralgias and myalgias.  Skin: Negative for color change, pallor, rash and wound.  Neurological: Negative for seizures and  headaches.  Psychiatric/Behavioral: Negative for confusion and suicidal ideas.    Objective:    BP (!) 150/94   Pulse 92   Ht 5' (1.524 m)   Wt 159 lb (72.1 kg)   BMI 31.05 kg/m   Wt Readings from Last 3 Encounters:  02/08/18 159 lb (72.1 kg)  08/25/17 159 lb (72.1 kg)  05/26/17 155 lb (70.3 kg)    Physical Exam  Constitutional: She is oriented to person, place, and time.  HENT:  Head: Normocephalic and atraumatic.  Eyes: EOM are normal.  Neck: Normal range of motion. Neck supple. No tracheal deviation present. No thyromegaly present.  Cardiovascular:     Pulmonary/Chest: Effort normal and breath sounds normal.  Abdominal: Soft. Bowel sounds are normal. There is no abdominal tenderness. There is no guarding.  Musculoskeletal:        General: No edema.  Neurological: She is alert and oriented to person, place, and time. She has normal reflexes. No cranial nerve deficit. Coordination normal.  Skin: Skin is warm and dry. No rash noted. No erythema. No pallor.  Psychiatric: She has a normal mood and affect. Judgment normal.    Recent Results (from the past 2160 hour(s))  Comprehensive metabolic panel     Status: Abnormal   Collection Time:  02/01/18  8:18 AM  Result Value Ref Range   Glucose 389 (H) 65 - 99 mg/dL   BUN 19 6 - 24 mg/dL   Creatinine, Ser 1.59 (H) 0.57 - 1.00 mg/dL   GFR calc non Af Amer 36 (L) >59 mL/min/1.73   GFR calc Af Amer 42 (L) >59 mL/min/1.73   BUN/Creatinine Ratio 12 9 - 23   Sodium 139 134 - 144 mmol/L   Potassium 4.4 3.5 - 5.2 mmol/L   Chloride 97 96 - 106 mmol/L   CO2 24 20 - 29 mmol/L   Calcium 9.0 8.7 - 10.2 mg/dL   Total Protein 7.0 6.0 - 8.5 g/dL   Albumin 4.0 3.8 - 4.9 g/dL    Comment:               **Please note reference interval change**   Globulin, Total 3.0 1.5 - 4.5 g/dL   Albumin/Globulin Ratio 1.3 1.2 - 2.2   Bilirubin Total 0.3 0.0 - 1.2 mg/dL   Alkaline Phosphatase 109 39 - 117 IU/L   AST 18 0 - 40 IU/L   ALT 14 0 - 32  IU/L  Hgb A1c w/o eAG     Status: Abnormal   Collection Time: 02/01/18  8:18 AM  Result Value Ref Range   Hgb A1c MFr Bld 11.2 (H) 4.8 - 5.6 %    Comment:          Prediabetes: 5.7 - 6.4          Diabetes: >6.4          Glycemic control for adults with diabetes: <7.0     Assessment & Plan:    1. Uncontrolled type 1 diabetes mellitus-chronically uncontrolled - Patient has currently uncontrolled symptomatic type 1 DM ( administrative reclassified as type I for management purposes) since  58 years of age. -She returns with persistent hyperglycemia with A1c of 11.2% unchanged from her last visit.   -This is mainly due to the fact that she misses her basal insulin 3 to 4 days out of 7 for fear of hypoglycemia.  She run out of Levemir for the last 14 days and did not call clinic.   -She admits to dietary indiscretion including consumption of large quantities of soda in the evening hours.   - Her diabetes is complicated by neuropathy, patient remains at a high risk for more acute and chronic complications of diabetes which include CAD, CVA, CKD, retinopathy, and neuropathy. These are all discussed in detail with the patient.  - I have counseled the patient on diet management  by adopting a carbohydrate restricted/protein rich diet.  -She admits that she has a room to improve in her dietary habits. -  Suggestion is made for her to avoid simple carbohydrates  from her diet including Cakes, Sweet Desserts / Pastries, Ice Cream, Soda (diet and regular), Sweet Tea, Candies, Chips, Cookies, Store Bought Juices, Alcohol in Excess of  1-2 drinks a day, Artificial Sweeteners, and "Sugar-free" Products. This will help patient to have stable blood glucose profile and potentially avoid unintended weight gain.  - I encouraged the patient to switch to  and consume more unprocessed or minimally processed complex starch and increased protein intake (animal or plant source), fruits, and vegetables.  - Patient  is advised to stick to a routine mealtimes to eat 3 meals  a day and some light snacks, since she has a big weight deficit.    - I have approached patient with the following  individualized plan to manage diabetes and patient agrees:   -She remains unengaged at this point is serious problem affecting our ability to adjust her treatment optimally.  -She will continue to need intensive treatment with basal/bolus insulin in order for her to achieve and maintain control of diabetes to target.   - She drinks a lot of soda as well as processed snacks and crackers unnecessarily.  She wishes to keep her blood glucose close to 200 , because she does not "feel well" if it drops to below 200 mg/dL. -She is advised to resume her Levemir 70 units nightly at 8 PM to avoid risk of skipping which has been having a lot daily, continue NovoLog 15 units 3 times a day before meals  for pre-meal blood glucose above 90 mg/dL, plus correction for pre-meal blood glucose above 150 mg/dL. - Patient is warned not to take insulin without proper monitoring per orders. -Adjustment parameters are given for hypo and hyperglycemia in writing. -Patient is encouraged to call clinic for blood glucose levels less than 70 or above 300 mg /dl.  - She is not a candidate for metformin, SGLT2 inhibitors, nor  incretin therapy.  - Patient specific target  A1c;  LDL, HDL, Triglycerides, and  Waist Circumference were discussed in detail.  2) BP/HTN: Her blood pressure is not controlled to target.  She is advised to continue her current blood pressure medications including lisinopril/HCTZ. I have advised her to continue on same for control of blood pressure to target.   3) Lipids/HPL: Her recent lipid panel showed uncontrolled  LDL at 117.  She is advised to continue atorvastatin 10 mg p.o. nightly.   4) uterine leiomyoma: Large at 24 week. She has not kept her appointment with Dr. Glo Herring.  She was considered for hysterectomy, however she  has been reluctant to have surgery. -I advised her to follow up with the plan.  5)  Weight/Diet:  She has appropriately gained 60 pounds so far good development for her.  2 years ago, she did have significant weight deficit.  CDE Consult in progress , exercise, and detailed carbohydrates information provided.  6) Vitamin D deficiency.  She is on ongoing supplement with cholecalciferol 2000 units daily.   7) Chronic Care/Health Maintenance:  -Patient is on ACEI/ARB medications and encouraged to continue to follow up with Ophthalmology, Podiatrist at least yearly or according to recommendations, and advised to   stay away from smoking. I have recommended yearly flu vaccine and pneumonia vaccination at least every 5 years; moderate intensity exercise for up to 150 minutes weekly; and  sleep for at least 7 hours a day.  - I advised patient to maintain close follow up with House, Deliah Goody, FNP for primary care needs. - Time spent with the patient: 25 min, of which >50% was spent in reviewing her blood glucose logs , discussing her hypoglycemia and hyperglycemia episodes, reviewing her current and  previous labs / studies and medications  doses and developing a plan to avoid hypoglycemia and hyperglycemia. Please refer to Patient Instructions for Blood Glucose Monitoring and Insulin/Medications Dosing Guide"  in media tab for additional information. [name] participated in the discussions, expressed understanding, and voiced agreement with the above plans.  All questions were answered to her satisfaction. she is encouraged to contact clinic should she have any questions or concerns prior to her return visit.  Follow up plan: - Return in about 3 months (around 05/10/2018) for Follow up with Pre-visit Labs, Meter, and  Logs.  Glade Lloyd, MD Phone: [phone]  Fax: [phone]   -  This note was partially dictated with voice recognition software. Similar sounding words can be transcribed  inadequately or may not  be corrected upon review.  02/08/2018, 1:08 PM

## 2018-02-09 ENCOUNTER — Other Ambulatory Visit: Payer: Self-pay | Admitting: "Endocrinology

## 2018-02-09 LAB — HM DIABETES EYE EXAM

## 2018-05-04 ENCOUNTER — Other Ambulatory Visit: Payer: Self-pay | Admitting: "Endocrinology

## 2018-05-05 LAB — COMPREHENSIVE METABOLIC PANEL
ALT: 11 IU/L (ref 0–32)
AST: 17 IU/L (ref 0–40)
Albumin/Globulin Ratio: 1.4 (ref 1.2–2.2)
Albumin: 4.2 g/dL (ref 3.8–4.9)
Alkaline Phosphatase: 127 IU/L — ABNORMAL HIGH (ref 39–117)
BUN/Creatinine Ratio: 22 (ref 9–23)
BUN: 28 mg/dL — ABNORMAL HIGH (ref 6–24)
Bilirubin Total: 0.3 mg/dL (ref 0.0–1.2)
CO2: 23 mmol/L (ref 20–29)
Calcium: 9 mg/dL (ref 8.7–10.2)
Chloride: 99 mmol/L (ref 96–106)
Creatinine, Ser: 1.27 mg/dL — ABNORMAL HIGH (ref 0.57–1.00)
GFR calc Af Amer: 55 mL/min/{1.73_m2} — ABNORMAL LOW (ref >59)
GFR calc non Af Amer: 48 mL/min/{1.73_m2} — ABNORMAL LOW (ref >59)
Globulin, Total: 2.9 g/dL (ref 1.5–4.5)
Glucose: 316 mg/dL — ABNORMAL HIGH (ref 65–99)
Potassium: 4.2 mmol/L (ref 3.5–5.2)
Sodium: 137 mmol/L (ref 134–144)
Total Protein: 7.1 g/dL (ref 6.0–8.5)

## 2018-05-05 LAB — HGB A1C W/O EAG: Hgb A1c MFr Bld: 10.7 % — ABNORMAL HIGH (ref 4.8–5.6)

## 2018-05-05 LAB — VITAMIN D 25 HYDROXY (VIT D DEFICIENCY, FRACTURES): Vit D, 25-Hydroxy: 15.9 ng/mL — ABNORMAL LOW (ref 30.0–100.0)

## 2018-05-05 LAB — SPECIMEN STATUS REPORT

## 2018-05-10 ENCOUNTER — Other Ambulatory Visit: Payer: Self-pay

## 2018-05-10 ENCOUNTER — Encounter: Payer: Self-pay | Admitting: "Endocrinology

## 2018-05-10 ENCOUNTER — Ambulatory Visit (INDEPENDENT_AMBULATORY_CARE_PROVIDER_SITE_OTHER): Payer: Medicaid Other | Admitting: "Endocrinology

## 2018-05-10 VITALS — BP 137/74 | HR 90 | Ht 60.0 in | Wt 165.0 lb

## 2018-05-10 DIAGNOSIS — I10 Essential (primary) hypertension: Secondary | ICD-10-CM | POA: Diagnosis not present

## 2018-05-10 DIAGNOSIS — E108 Type 1 diabetes mellitus with unspecified complications: Secondary | ICD-10-CM

## 2018-05-10 DIAGNOSIS — E782 Mixed hyperlipidemia: Secondary | ICD-10-CM

## 2018-05-10 DIAGNOSIS — E1065 Type 1 diabetes mellitus with hyperglycemia: Secondary | ICD-10-CM

## 2018-05-10 DIAGNOSIS — E559 Vitamin D deficiency, unspecified: Secondary | ICD-10-CM | POA: Diagnosis not present

## 2018-05-10 DIAGNOSIS — IMO0002 Reserved for concepts with insufficient information to code with codable children: Secondary | ICD-10-CM

## 2018-05-10 NOTE — Progress Notes (Signed)
Endocrinology follow-up note  Subjective:    Patient ID: Katherine Patton, female    DOB: Mar 02, 1960. Patient is being seen in follow-up for management of chronically uncontrolled diabetes requested by Bradd Burner, FNP.  Past Medical History:  Diagnosis Date  . Diabetes mellitus without complication ([place])   . Uterine mass    Past Surgical History:  Procedure Laterality Date  . CATARACT EXTRACTION     Social History   Socioeconomic History  . Marital status: Single    Spouse name: Not on file  . Number of children: Not on file  . Years of education: Not on file  . Highest education level: Not on file  Occupational History  . Not on file  Social Needs  . Financial resource strain: Not on file  . Food insecurity:    Worry: Not on file    Inability: Not on file  . Transportation needs:    Medical: Not on file    Non-medical: Not on file  Tobacco Use  . Smoking status: Never Smoker  . Smokeless tobacco: Never Used  Substance and Sexual Activity  . Alcohol use: No  . Drug use: No  . Sexual activity: Not Currently    Birth control/protection: None  Lifestyle  . Physical activity:    Days per week: Not on file    Minutes per session: Not on file  . Stress: Not on file  Relationships  . Social connections:    Talks on phone: Not on file    Gets together: Not on file    Attends religious service: Not on file    Active member of club or organization: Not on file    Attends meetings of clubs or organizations: Not on file    Relationship status: Not on file  Other Topics Concern  . Not on file  Social History Narrative  . Not on file   Outpatient Encounter Medications as of 05/10/2018  Medication Sig  . acetaminophen (TYLENOL) 500 MG tablet Take 500 mg by mouth every 6 (six) hours as needed for headache.  Marland Kitchen atorvastatin (LIPITOR) 10 MG tablet Take 1 tablet (10 mg total) by mouth daily.  . Cholecalciferol 2000 units CAPS Take 1 capsule (2,000 Units total) by mouth  daily with breakfast.  . gabapentin (NEURONTIN) 300 MG capsule Take 300 mg by mouth 2 (two) times daily.  Marland Kitchen glucose blood (ACCU-CHEK GUIDE) test strip Use as instructed 4 x daily E10.65  . insulin aspart (NOVOLOG FLEXPEN) 100 UNIT/ML FlexPen Inject 15-21 Units into the skin 3 (three) times daily with meals.  . Insulin Detemir (LEVEMIR FLEXTOUCH) 100 UNIT/ML Pen Inject 70 Units into the skin at bedtime.  Marland Kitchen lisinopril-hydrochlorothiazide (PRINZIDE,ZESTORETIC) 10-12.5 MG tablet Take 1 tablet by mouth daily.  Marland Kitchen RELION PEN NEEDLE 31G/8MM 31G X 8 MM MISC USE 1 PEN NEEDLE 4 TIMES DAILY   No facility-administered encounter medications on file as of 05/10/2018.    ALLERGIES: No Known Allergies VACCINATION STATUS:  There is no immunization history on file for this patient.  Diabetes  She presents for her follow-up diabetic visit. She has type 1 (She is administratively classified as type 1 diabetes for management purposes.) diabetes mellitus. Onset time: She was diagnosed at approximate age of 74 years. Her disease course has been worsening. There are no hypoglycemic associated symptoms. Pertinent negatives for hypoglycemia include no confusion, headaches, pallor or seizures. Associated symptoms include polydipsia and polyuria. Pertinent negatives for diabetes include no blurred vision, no chest pain, no fatigue,  no polyphagia and no weight loss. There are no hypoglycemic complications. Symptoms are worsening. Diabetic complications include peripheral neuropathy. Risk factors for coronary artery disease include diabetes mellitus, sedentary lifestyle and hypertension. Current diabetic treatment includes insulin injections (She discloses today to me that she has been using Levemir from 2017.). She is compliant with treatment most of the time. Her weight is fluctuating minimally. She is following a generally unhealthy diet. When asked about meal planning, she reported none. She has had a previous visit with a  dietitian. She never participates in exercise. Her home blood glucose trend is decreasing steadily. Her breakfast blood glucose range is generally >200 mg/dl. Her lunch blood glucose range is generally >200 mg/dl. Her dinner blood glucose range is generally >200 mg/dl. Her bedtime blood glucose range is generally >200 mg/dl. Her overall blood glucose range is >200 mg/dl. An ACE inhibitor/angiotensin II receptor blocker is being taken. Eye exam is current.  Hypertension  This is a chronic problem. The current episode started more than 1 year ago. Pertinent negatives include no blurred vision, chest pain, headaches, palpitations or shortness of breath. Risk factors for coronary artery disease include diabetes mellitus, dyslipidemia, sedentary lifestyle and post-menopausal state. Past treatments include ACE inhibitors and beta blockers.    Review of Systems  Constitutional: Negative for chills, fatigue, fever, unexpected weight change and weight loss.  HENT: Negative for trouble swallowing and voice change.   Eyes: Negative for blurred vision and visual disturbance.  Respiratory: Negative for cough, shortness of breath and wheezing.   Cardiovascular: Negative for chest pain, palpitations and leg swelling.  Gastrointestinal: Negative for diarrhea, nausea and vomiting.  Endocrine: Positive for polydipsia and polyuria. Negative for cold intolerance, heat intolerance and polyphagia.  Musculoskeletal: Positive for gait problem. Negative for arthralgias and myalgias.  Skin: Negative for color change, pallor, rash and wound.  Neurological: Negative for seizures and headaches.  Psychiatric/Behavioral: Negative for confusion and suicidal ideas.    Objective:    BP 137/74   Pulse 90   Ht 5' (1.524 m)   Wt 165 lb (74.8 kg)   BMI 32.22 kg/m   Wt Readings from Last 3 Encounters:  05/10/18 165 lb (74.8 kg)  02/08/18 159 lb (72.1 kg)  08/25/17 159 lb (72.1 kg)    Physical Exam  Constitutional: She is  oriented to person, place, and time.  HENT:  Head: Normocephalic and atraumatic.  Eyes: EOM are normal.  Neck: Normal range of motion. Neck supple. No tracheal deviation present. No thyromegaly present.  Cardiovascular:     Pulmonary/Chest: Effort normal and breath sounds normal.  Abdominal: Soft. Bowel sounds are normal. There is no abdominal tenderness. There is no guarding.  Musculoskeletal:        General: No edema.  Neurological: She is alert and oriented to person, place, and time. She has normal reflexes. No cranial nerve deficit. Coordination normal.  Skin: Skin is warm and dry. No rash noted. No erythema. No pallor.  Psychiatric: She has a normal mood and affect. Judgment normal.    Recent Results (from the past 2160 hour(s))  Comprehensive metabolic panel     Status: Abnormal   Collection Time: 05/04/18  8:03 AM  Result Value Ref Range   Glucose 316 (H) 65 - 99 mg/dL   BUN 28 (H) 6 - 24 mg/dL   Creatinine, Ser 1.27 (H) 0.57 - 1.00 mg/dL   GFR calc non Af Amer 48 (L) >59 mL/min/1.73   GFR calc Af Amer 55 (L) >  59 mL/min/1.73   BUN/Creatinine Ratio 22 9 - 23   Sodium 137 134 - 144 mmol/L   Potassium 4.2 3.5 - 5.2 mmol/L   Chloride 99 96 - 106 mmol/L   CO2 23 20 - 29 mmol/L   Calcium 9.0 8.7 - 10.2 mg/dL   Total Protein 7.1 6.0 - 8.5 g/dL   Albumin 4.2 3.8 - 4.9 g/dL   Globulin, Total 2.9 1.5 - 4.5 g/dL   Albumin/Globulin Ratio 1.4 1.2 - 2.2   Bilirubin Total 0.3 0.0 - 1.2 mg/dL   Alkaline Phosphatase 127 (H) 39 - 117 IU/L   AST 17 0 - 40 IU/L   ALT 11 0 - 32 IU/L  Hgb A1c w/o eAG     Status: Abnormal   Collection Time: 05/04/18  8:03 AM  Result Value Ref Range   Hgb A1c MFr Bld 10.7 (H) 4.8 - 5.6 %    Comment:          Prediabetes: 5.7 - 6.4          Diabetes: >6.4          Glycemic control for adults with diabetes: <7.0   VITAMIN D 25 Hydroxy (Vit-D Deficiency, Fractures)     Status: Abnormal   Collection Time: 05/04/18  8:03 AM  Result Value Ref Range   Vit  D, 25-Hydroxy 15.9 (L) 30.0 - 100.0 ng/mL    Comment: Vitamin D deficiency has been defined by the Institute of Medicine and an Endocrine Society practice guideline as a level of serum 25-OH vitamin D less than 20 ng/mL (1,2). The Endocrine Society went on to further define vitamin D insufficiency as a level between 21 and 29 ng/mL (2). 1. IOM (Institute of Medicine). 2010. Dietary reference    intakes for calcium and D. [place]: The    [employer]. 2. Holick MF, Binkley [place], Bischoff-Ferrari HA, et al.    Evaluation, treatment, and prevention of vitamin D    deficiency: an Endocrine Society clinical practice    guideline. JCEM. 2011 Jul; 96(7):1911-30.   Specimen status report     Status: None   Collection Time: 05/04/18  8:03 AM  Result Value Ref Range   specimen status report Comment     Comment: Isac Caddy CMP14 Default Isac Caddy CMP14 Default A hand-written panel/profile was received from your office. In accordance with the [facility] Ambiguous Test Code Policy dated July 9509, we have completed your order by using the closest currently or formerly recognized AMA panel.  We have assigned Comprehensive Metabolic Panel (14), Test Code #322000 to this request.  If this is not the testing you wished to receive on this specimen, please contact the [place] Client Inquiry/Technical Services Department to clarify the test order.  We appreciate your business.     Assessment & Plan:    1. Uncontrolled type 1 diabetes mellitus-chronically uncontrolled - Patient has currently uncontrolled symptomatic type 1 DM ( administrative reclassified as type I for management purposes) since  58 years of age. -She returns with slight improvement in her A1c at 10.7%, overall her best A1c in the last 5 tests.   She admits to dietary indiscretion including consumption of large quantities of soda in the evening hours.   - Her diabetes is complicated by neuropathy, patient remains  at a high risk for more acute and chronic complications of diabetes which include CAD, CVA, CKD, retinopathy, and neuropathy. These are all discussed in detail with the patient.  - I  have counseled the patient on diet management  by adopting a carbohydrate restricted/protein rich diet. - Patient admits there is a room for improvement in her diet and drink choices. -  Suggestion is made for her to avoid simple carbohydrates  from her diet including Cakes, Sweet Desserts / Pastries, Ice Cream, Soda (diet and regular), Sweet Tea, Candies, Chips, Cookies, Store Bought Juices, Alcohol in Excess of  1-2 drinks a day, Artificial Sweeteners, and "Sugar-free" Products. This will help patient to have stable blood glucose profile and potentially avoid unintended weight gain.   - I encouraged the patient to switch to  and consume more unprocessed or minimally processed complex starch and increased protein intake (animal or plant source), fruits, and vegetables.  - Patient is advised to stick to a routine mealtimes to eat 3 meals  a day and some light snacks, since she has a big weight deficit.    - I have approached patient with the following individualized plan to manage diabetes and patient agrees:   -She remains unengaged at this point is serious problem affecting our ability to adjust her treatment optimally.  -She will continue to need intensive treatment with basal/bolus insulin in order for her to achieve and maintain control of diabetes to target.   - She drinks a lot of soda as well as processed snacks and crackers unnecessarily.  She wishes to keep her blood glucose close to 200 , because she does not "feel well" if it drops to below 200 mg/dL. -She is advised to increase Levemir to 80 units  nightly at 8 PM to avoid risk of skipping which has been having a lot daily, continue NovoLog 15 units 3 times a day before meals  for pre-meal blood glucose above 90 mg/dL, plus correction for pre-meal blood  glucose above 150 mg/dL. - Patient is warned not to take insulin without proper monitoring per orders. -Adjustment parameters are given for hypo and hyperglycemia in writing. -Patient is encouraged to call clinic for blood glucose levels less than 70 or above 300 mg /dl.  - She is not a candidate for metformin, SGLT2 inhibitors, nor  incretin therapy.  - Patient specific target  A1c;  LDL, HDL, Triglycerides, and  Waist Circumference were discussed in detail.  2) BP/HTN: Her blood pressure is controlled to target.  She is advised to continue her current blood pressure medications including lisinopril/HCTZ. I have advised her to continue on same for control of blood pressure to target.   3) Lipids/HPL: Her recent lipid panel showed uncontrolled  LDL at 117.  She is advised to continue atorvastatin 10 mg p.o. nightly.    4) uterine leiomyoma: Large at 24 week. She has not kept her appointment with Dr. Glo Herring.  She was considered for hysterectomy, however she has been reluctant to have surgery. -I advised her to follow up with the plan.  5)  Weight/Diet:  She has appropriately gained 60 pounds so far good development for her.  2 years ago, she did have significant weight deficit.  CDE Consult in progress , exercise, and detailed carbohydrates information provided.  6) Vitamin D deficiency.  She is on ongoing supplement with cholecalciferol 2000 units daily.   7) Chronic Care/Health Maintenance:  -Patient is on ACEI/ARB medications and encouraged to continue to follow up with Ophthalmology, Podiatrist at least yearly or according to recommendations, and advised to   stay away from smoking. I have recommended yearly flu vaccine and pneumonia vaccination at least every 5 years;  moderate intensity exercise for up to 150 minutes weekly; and  sleep for at least 7 hours a day.  - I advised patient to maintain close follow up with House, Deliah Goody, FNP for primary care needs.  - Time spent with the  patient: 25 min, of which >50% was spent in reviewing her blood glucose logs , discussing her hypoglycemia and hyperglycemia episodes, reviewing her current and  previous labs / studies and medications  doses and developing a plan to avoid hypoglycemia and hyperglycemia. Please refer to Patient Instructions for Blood Glucose Monitoring and Insulin/Medications Dosing Guide"  in media tab for additional information. Please  also refer to " Patient Self Inventory" in the Media  tab for reviewed elements of pertinent patient history.  [name] participated in the discussions, expressed understanding, and voiced agreement with the above plans.  All questions were answered to her satisfaction. she is encouraged to contact clinic should she have any questions or concerns prior to her return visit.  Follow up plan: - Return in about 3 months (around 08/09/2018) for Follow up with Pre-visit Labs, Meter, and Logs.  Glade Lloyd, MD Phone: [phone]  Fax: [phone]   -  This note was partially dictated with voice recognition software. Similar sounding words can be transcribed inadequately or may not  be corrected upon review.  05/10/2018, 11:54 AM

## 2018-06-14 ENCOUNTER — Other Ambulatory Visit: Payer: Self-pay | Admitting: "Endocrinology

## 2018-06-15 ENCOUNTER — Other Ambulatory Visit: Payer: Self-pay

## 2018-06-15 MED ORDER — NOVOLOG FLEXPEN 100 UNIT/ML ~~LOC~~ SOPN: PEN_INJECTOR | SUBCUTANEOUS | 2 refills | Status: DC

## 2018-08-09 ENCOUNTER — Ambulatory Visit: Payer: Medicaid Other | Admitting: "Endocrinology

## 2018-08-30 ENCOUNTER — Other Ambulatory Visit: Payer: Self-pay | Admitting: "Endocrinology

## 2018-08-31 LAB — COMPREHENSIVE METABOLIC PANEL
ALT: 10 IU/L (ref 0–32)
AST: 10 IU/L (ref 0–40)
Albumin/Globulin Ratio: 1.2 (ref 1.2–2.2)
Albumin: 3.7 g/dL — ABNORMAL LOW (ref 3.8–4.9)
Alkaline Phosphatase: 132 IU/L — ABNORMAL HIGH (ref 39–117)
BUN/Creatinine Ratio: 15 (ref 9–23)
BUN: 21 mg/dL (ref 6–24)
Bilirubin Total: <0.2 mg/dL (ref 0.0–1.2)
CO2: 25 mmol/L (ref 20–29)
Calcium: 9 mg/dL (ref 8.7–10.2)
Chloride: 98 mmol/L (ref 96–106)
Creatinine, Ser: 1.43 mg/dL — ABNORMAL HIGH (ref 0.57–1.00)
GFR calc Af Amer: 48 mL/min/{1.73_m2} — ABNORMAL LOW (ref >59)
GFR calc non Af Amer: 41 mL/min/{1.73_m2} — ABNORMAL LOW (ref >59)
Globulin, Total: 3.2 g/dL (ref 1.5–4.5)
Glucose: 353 mg/dL — ABNORMAL HIGH (ref 65–99)
Potassium: 4.8 mmol/L (ref 3.5–5.2)
Sodium: 136 mmol/L (ref 134–144)
Total Protein: 6.9 g/dL (ref 6.0–8.5)

## 2018-08-31 LAB — HGB A1C W/O EAG: Hgb A1c MFr Bld: 10.8 % — ABNORMAL HIGH (ref 4.8–5.6)

## 2018-09-07 ENCOUNTER — Encounter: Payer: Self-pay | Admitting: "Endocrinology

## 2018-09-07 ENCOUNTER — Other Ambulatory Visit: Payer: Self-pay

## 2018-09-07 ENCOUNTER — Ambulatory Visit (INDEPENDENT_AMBULATORY_CARE_PROVIDER_SITE_OTHER): Payer: Medicaid Other | Admitting: "Endocrinology

## 2018-09-07 DIAGNOSIS — E782 Mixed hyperlipidemia: Secondary | ICD-10-CM | POA: Diagnosis not present

## 2018-09-07 DIAGNOSIS — I10 Essential (primary) hypertension: Secondary | ICD-10-CM

## 2018-09-07 DIAGNOSIS — E1065 Type 1 diabetes mellitus with hyperglycemia: Secondary | ICD-10-CM

## 2018-09-07 DIAGNOSIS — IMO0002 Reserved for concepts with insufficient information to code with codable children: Secondary | ICD-10-CM

## 2018-09-07 DIAGNOSIS — E108 Type 1 diabetes mellitus with unspecified complications: Secondary | ICD-10-CM

## 2018-09-07 MED ORDER — LEVEMIR FLEXTOUCH 100 UNIT/ML ~~LOC~~ SOPN: 80.0000 [IU] | PEN_INJECTOR | Freq: Every day | SUBCUTANEOUS | 3 refills | Status: DC

## 2018-09-07 MED ORDER — ACCU-CHEK GUIDE VI STRP: ORAL_STRIP | 5 refills | Status: DC

## 2018-09-07 MED ORDER — NOVOLOG FLEXPEN 100 UNIT/ML ~~LOC~~ SOPN: PEN_INJECTOR | SUBCUTANEOUS | 2 refills | Status: DC

## 2018-09-07 NOTE — Progress Notes (Signed)
09/07/2018                                                    Endocrinology Telehealth Visit Follow up Note -During COVID -19 Pandemic  This visit type was conducted due to national recommendations for restrictions regarding the COVID-19 Pandemic  in an effort to limit this patient's exposure and mitigate transmission of the corona virus.  Due to her co-morbid illnesses, Katherine Patton is at  moderate to high risk for complications without adequate follow up.  This format is felt to be most appropriate for her at this time.  I connected with this patient on 09/07/2018   by telephone and verified that I am speaking with the correct person using two identifiers. Katherine Patton, 02-09-1960. she has verbally consented to this visit. All issues noted in this document were discussed and addressed. The format was not optimal for physical exam.    Subjective:    Patient ID: Katherine Patton, female    DOB: Dec 08, 1960. Patient is being engaged in telehealth via telephone in follow-up for management of chronically uncontrolled diabetes requested by Bradd Burner, FNP.  Past Medical History:  Diagnosis Date  . Diabetes mellitus without complication ([place])   . Uterine mass    Past Surgical History:  Procedure Laterality Date  . CATARACT EXTRACTION     Social History   Socioeconomic History  . Marital status: Single    Spouse name: Not on file  . Number of children: Not on file  . Years of education: Not on file  . Highest education level: Not on file  Occupational History  . Not on file  Social Needs  . Financial resource strain: Not on file  . Food insecurity    Worry: Not on file    Inability: Not on file  . Transportation needs    Medical: Not on file    Non-medical: Not on file  Tobacco Use  . Smoking status: Never Smoker  . Smokeless tobacco: Never Used  Substance and Sexual Activity  . Alcohol use: No  . Drug use: No  . Sexual activity: Not Currently    Birth control/protection:  None  Lifestyle  . Physical activity    Days per week: Not on file    Minutes per session: Not on file  . Stress: Not on file  Relationships  . Social Herbalist on phone: Not on file    Gets together: Not on file    Attends religious service: Not on file    Active member of club or organization: Not on file    Attends meetings of clubs or organizations: Not on file    Relationship status: Not on file  Other Topics Concern  . Not on file  Social History Narrative  . Not on file   Outpatient Encounter Medications as of 09/07/2018  Medication Sig  . acetaminophen (TYLENOL) 500 MG tablet Take 500 mg by mouth every 6 (six) hours as needed for headache.  Marland Kitchen atorvastatin (LIPITOR) 10 MG tablet Take 1 tablet (10 mg total) by mouth daily.  . Cholecalciferol 2000 units CAPS Take 1 capsule (2,000 Units total) by mouth daily with breakfast.  . gabapentin (NEURONTIN) 300 MG capsule Take 300 mg by mouth 2 (two) times daily.  Marland Kitchen glucose blood (ACCU-CHEK GUIDE) test strip Use as  instructed 4 x daily E10.65  . Insulin Detemir (LEVEMIR FLEXTOUCH) 100 UNIT/ML Pen Inject 80 Units into the skin at bedtime.  Marland Kitchen lisinopril-hydrochlorothiazide (PRINZIDE,ZESTORETIC) 10-12.5 MG tablet Take 1 tablet by mouth daily.  Marland Kitchen NOVOLOG FLEXPEN 100 UNIT/ML FlexPen INJECT 18-24 UNITS SUBCUTANEOUSLY THREE TIMES DAILY WITH MEALS  . RELION PEN NEEDLE 31G/8MM 31G X 8 MM MISC USE 1 PEN NEEDLE 4 TIMES DAILY  . [DISCONTINUED] glucose blood (ACCU-CHEK GUIDE) test strip Use as instructed 4 x daily E10.65  . [DISCONTINUED] Insulin Detemir (LEVEMIR FLEXTOUCH) 100 UNIT/ML Pen Inject 70 Units into the skin at bedtime.  . [DISCONTINUED] NOVOLOG FLEXPEN 100 UNIT/ML FlexPen INJECT 15-21 UNITS SUBCUTANEOUSLY THREE TIMES DAILY WITH MEALS   No facility-administered encounter medications on file as of 09/07/2018.    ALLERGIES: No Known Allergies VACCINATION STATUS:  There is no immunization history on file for this  patient.  Diabetes She presents for her follow-up diabetic visit. She has type 1 (She is administratively classified as type 1 diabetes for management purposes.) diabetes mellitus. Onset time: She was diagnosed at approximate age of 11 years. Her disease course has been improving. There are no hypoglycemic associated symptoms. Pertinent negatives for hypoglycemia include no confusion, headaches, pallor or seizures. Associated symptoms include polydipsia and polyuria. Pertinent negatives for diabetes include no blurred vision, no chest pain, no fatigue, no polyphagia and no weight loss. There are no hypoglycemic complications. Symptoms are improving. Diabetic complications include peripheral neuropathy. Risk factors for coronary artery disease include diabetes mellitus, sedentary lifestyle and hypertension. Current diabetic treatment includes insulin injections (She discloses today to me that she has been using Levemir from 2017.). She is compliant with treatment most of the time. She is following a generally unhealthy diet. When asked about meal planning, she reported none. She has had a previous visit with a dietitian. She never participates in exercise. Her breakfast blood glucose range is generally >200 mg/dl. Her lunch blood glucose range is generally >200 mg/dl. Her dinner blood glucose range is generally >200 mg/dl. Her bedtime blood glucose range is generally >200 mg/dl. Her overall blood glucose range is >200 mg/dl. An ACE inhibitor/angiotensin II receptor blocker is being taken. Eye exam is current.  Hypertension This is a chronic problem. The current episode started more than 1 year ago. Pertinent negatives include no blurred vision, chest pain, headaches, palpitations or shortness of breath. Risk factors for coronary artery disease include diabetes mellitus, dyslipidemia, sedentary lifestyle and post-menopausal state. Past treatments include ACE inhibitors and beta blockers.    Review of Systems   Constitutional: Negative for chills, fatigue, fever, unexpected weight change and weight loss.  HENT: Negative for trouble swallowing and voice change.   Eyes: Negative for blurred vision and visual disturbance.  Respiratory: Negative for cough, shortness of breath and wheezing.   Cardiovascular: Negative for chest pain, palpitations and leg swelling.  Gastrointestinal: Negative for diarrhea, nausea and vomiting.  Endocrine: Positive for polydipsia and polyuria. Negative for cold intolerance, heat intolerance and polyphagia.  Musculoskeletal: Positive for gait problem. Negative for arthralgias and myalgias.  Skin: Negative for color change, pallor, rash and wound.  Neurological: Negative for seizures and headaches.  Psychiatric/Behavioral: Negative for confusion and suicidal ideas.    Objective:    There were no vitals taken for this visit.  Wt Readings from Last 3 Encounters:  05/10/18 165 lb (74.8 kg)  02/08/18 159 lb (72.1 kg)  08/25/17 159 lb (72.1 kg)      Recent Results (from the past 2160  hour(s))  Comprehensive metabolic panel     Status: Abnormal   Collection Time: 08/30/18  8:28 AM  Result Value Ref Range   Glucose 353 (H) 65 - 99 mg/dL   BUN 21 6 - 24 mg/dL   Creatinine, Ser 1.43 (H) 0.57 - 1.00 mg/dL   GFR calc non Af Amer 41 (L) >59 mL/min/1.73   GFR calc Af Amer 48 (L) >59 mL/min/1.73   BUN/Creatinine Ratio 15 9 - 23   Sodium 136 134 - 144 mmol/L   Potassium 4.8 3.5 - 5.2 mmol/L   Chloride 98 96 - 106 mmol/L   CO2 25 20 - 29 mmol/L   Calcium 9.0 8.7 - 10.2 mg/dL   Total Protein 6.9 6.0 - 8.5 g/dL   Albumin 3.7 (L) 3.8 - 4.9 g/dL   Globulin, Total 3.2 1.5 - 4.5 g/dL   Albumin/Globulin Ratio 1.2 1.2 - 2.2   Bilirubin Total <0.2 0.0 - 1.2 mg/dL   Alkaline Phosphatase 132 (H) 39 - 117 IU/L   AST 10 0 - 40 IU/L   ALT 10 0 - 32 IU/L  Hgb A1c w/o eAG     Status: Abnormal   Collection Time: 08/30/18  8:28 AM  Result Value Ref Range   Hgb A1c MFr Bld 10.8 (H)  4.8 - 5.6 %    Comment:          Prediabetes: 5.7 - 6.4          Diabetes: >6.4          Glycemic control for adults with diabetes: <7.0     Assessment & Plan:    1. Uncontrolled type 1 diabetes mellitus-chronically uncontrolled - Patient has currently uncontrolled symptomatic type 1 DM ( administrative reclassified as type I for management purposes) since  58 years of age. -She reports significantly above target glycemic profile, mainly due to the fact that she is avoiding tightening of glycemic profile .  Her previsit labs show A1c of 10.8%   She admits to dietary indiscretion including consumption of large quantities of soda in the evening hours.   - Her diabetes is complicated by neuropathy, patient remains at a high risk for more acute and chronic complications of diabetes which include CAD, CVA, CKD, retinopathy, and neuropathy. These are all discussed in detail with the patient.  - I have counseled the patient on diet management  by adopting a carbohydrate restricted/protein rich diet.  - she  admits there is a room for improvement in her diet and drink choices. -  Suggestion is made for her to avoid simple carbohydrates  from her diet including Cakes, Sweet Desserts / Pastries, Ice Cream, Soda (diet and regular), Sweet Tea, Candies, Chips, Cookies, Sweet Pastries,  Store Bought Juices, Alcohol in Excess of  1-2 drinks a day, Artificial Sweeteners, Coffee Creamer, and "Sugar-free" Products. This will help patient to have stable blood glucose profile and potentially avoid unintended weight gain.   - I encouraged the patient to switch to  and consume more unprocessed or minimally processed complex starch and increased protein intake (animal or plant source), fruits, and vegetables.  - Patient is advised to stick to a routine mealtimes to eat 3 meals  a day and some light snacks, since she has a big weight deficit.    - I have approached patient with the following individualized plan  to manage diabetes and patient agrees:   -She remains unengaged at this point is serious problem affecting our ability to adjust  her treatment optimally.  -She will continue to need intensive treatment with basal/bolus insulin in order for her to achieve and maintain control of diabetes to target.   - She drinks a lot of soda as well as processed snacks and crackers unnecessarily.  She wishes to keep her blood glucose close to 200 , because she does not "feel well" if it drops to below 200 mg/dL. -She is advised to continue Levemir at 80 units nightly, at 8 PM to avoid risk of skipping which has been having a lot daily, advised to increase NovoLog to 18  units 3 times a day before meals  for pre-meal blood glucose above 90 mg/dL, plus correction for pre-meal blood glucose above 150 mg/dL. - Patient is warned not to take insulin without proper monitoring per orders. -Adjustment parameters are given for hypo and hyperglycemia in writing. -Patient is encouraged to call clinic for blood glucose levels less than 70 or above 300 mg /dl.  - She is not a candidate for metformin, SGLT2 inhibitors, nor  incretin therapy.  - Patient specific target  A1c;  LDL, HDL, Triglycerides, and  Waist Circumference were discussed in detail.  2) BP/HTN:   She is advised to continue her current blood pressure medications including lisinopril/HCTZ. I have advised her to continue on same for control of blood pressure to target.   3) Lipids/HPL: Her recent lipid panel showed uncontrolled  LDL at 117.  She is advised to continue atorvastatin 10 mg p.o. nightly.      4) uterine leiomyoma: Large at 24 week. She has not kept her appointment with Dr. Glo Herring.  She was considered for hysterectomy, however she has been reluctant to have surgery. -I advised her to follow up with the plan.  5)  Weight/Diet:  She has appropriately gained 60 pounds so far good development for her.  2 years ago, she did have significant weight  deficit.  CDE Consult in progress , exercise, and detailed carbohydrates information provided.  6) Vitamin D deficiency.  She is on ongoing supplement with cholecalciferol 2000 units daily.   7) Chronic Care/Health Maintenance:  -Patient is on ACEI/ARB medications and encouraged to continue to follow up with Ophthalmology, Podiatrist at least yearly or according to recommendations, and advised to   stay away from smoking. I have recommended yearly flu vaccine and pneumonia vaccination at least every 5 years; moderate intensity exercise for up to 150 minutes weekly; and  sleep for at least 7 hours a day.  - I advised patient to maintain close follow up with House, Deliah Goody, FNP for primary care needs.  - Patient Care Time Today:  25 min, of which >50% was spent in  counseling and the rest reviewing her  current and  previous labs/studies, previous treatments, her blood glucose readings, and medications' doses and developing a plan for long-term care based on the latest recommendations for standards of care.   Katherine Patton participated in the discussions, expressed understanding, and voiced agreement with the above plans.  All questions were answered to her satisfaction. she is encouraged to contact clinic should she have any questions or concerns prior to her return visit.  Follow up plan: - Return in about 4 months (around 01/07/2019), or logs 8, for Bring Meter and Logs- A1c in Office.  Glade Lloyd, MD Phone: [phone]  Fax: [phone]   -  This note was partially dictated with voice recognition software. Similar sounding words can be transcribed inadequately or may not  be  corrected upon review.  09/07/2018, 5:01 PM

## 2019-01-11 ENCOUNTER — Ambulatory Visit: Payer: Medicaid Other | Admitting: "Endocrinology

## 2019-01-24 ENCOUNTER — Ambulatory Visit: Payer: Medicaid Other | Admitting: "Endocrinology

## 2019-01-26 ENCOUNTER — Ambulatory Visit: Payer: Medicaid Other | Admitting: "Endocrinology

## 2019-02-14 ENCOUNTER — Encounter: Payer: Self-pay | Admitting: "Endocrinology

## 2019-02-14 ENCOUNTER — Other Ambulatory Visit: Payer: Self-pay

## 2019-02-14 ENCOUNTER — Ambulatory Visit (INDEPENDENT_AMBULATORY_CARE_PROVIDER_SITE_OTHER): Payer: Medicaid Other | Admitting: "Endocrinology

## 2019-02-14 VITALS — BP 164/99 | HR 111 | Ht 60.0 in | Wt 173.2 lb

## 2019-02-14 DIAGNOSIS — I10 Essential (primary) hypertension: Secondary | ICD-10-CM

## 2019-02-14 DIAGNOSIS — E108 Type 1 diabetes mellitus with unspecified complications: Secondary | ICD-10-CM

## 2019-02-14 DIAGNOSIS — E1065 Type 1 diabetes mellitus with hyperglycemia: Secondary | ICD-10-CM | POA: Diagnosis not present

## 2019-02-14 DIAGNOSIS — E782 Mixed hyperlipidemia: Secondary | ICD-10-CM | POA: Diagnosis not present

## 2019-02-14 DIAGNOSIS — IMO0002 Reserved for concepts with insufficient information to code with codable children: Secondary | ICD-10-CM

## 2019-02-14 LAB — POCT GLYCOSYLATED HEMOGLOBIN (HGB A1C): Hemoglobin A1C: 10.6 % — AB (ref 4.0–5.6)

## 2019-02-14 MED ORDER — NOVOLOG FLEXPEN 100 UNIT/ML ~~LOC~~ SOPN: 20.0000 [IU] | PEN_INJECTOR | Freq: Three times a day (TID) | SUBCUTANEOUS | 2 refills | Status: DC

## 2019-02-14 MED ORDER — ACCU-CHEK GUIDE VI STRP: ORAL_STRIP | 5 refills | Status: DC

## 2019-02-14 NOTE — Progress Notes (Signed)
02/14/2019  Endocrinology follow-up note    Subjective:    Patient ID: Katherine Patton, female    DOB: 1960/12/25. Patient is being seen  in follow-up for management of chronically uncontrolled diabetes . PCP: Bradd Burner, FNP.  Past Medical History:  Diagnosis Date  . Diabetes mellitus without complication ([place])   . Uterine mass    Past Surgical History:  Procedure Laterality Date  . CATARACT EXTRACTION     Social History   Socioeconomic History  . Marital status: Single    Spouse name: Not on file  . Number of children: Not on file  . Years of education: Not on file  . Highest education level: Not on file  Occupational History  . Not on file  Tobacco Use  . Smoking status: Never Smoker  . Smokeless tobacco: Never Used  Substance and Sexual Activity  . Alcohol use: No  . Drug use: No  . Sexual activity: Not Currently    Birth control/protection: None  Other Topics Concern  . Not on file  Social History Narrative  . Not on file   Social Determinants of Health   Financial Resource Strain:   . Difficulty of Paying Living Expenses: Not on file  Food Insecurity:   . Worried About Charity fundraiser in the Last Year: Not on file  . Ran Out of Food in the Last Year: Not on file  Transportation Needs:   . Lack of Transportation (Medical): Not on file  . Lack of Transportation (Non-Medical): Not on file  Physical Activity:   . Days of Exercise per Week: Not on file  . Minutes of Exercise per Session: Not on file  Stress:   . Feeling of Stress : Not on file  Social Connections:   . Frequency of Communication with Friends and Family: Not on file  . Frequency of Social Gatherings with Friends and Family: Not on file  . Attends Religious Services: Not on file  . Active Member of Clubs or Organizations: Not on file  . Attends Archivist Meetings: Not on file  . Marital Status: Not on file   Outpatient Encounter Medications as of 02/14/2019  Medication Sig   . acetaminophen (TYLENOL) 500 MG tablet Take 500 mg by mouth every 6 (six) hours as needed for headache.  Marland Kitchen atorvastatin (LIPITOR) 10 MG tablet Take 1 tablet (10 mg total) by mouth daily.  . Cholecalciferol 2000 units CAPS Take 1 capsule (2,000 Units total) by mouth daily with breakfast.  . gabapentin (NEURONTIN) 300 MG capsule Take 300 mg by mouth 2 (two) times daily.  Marland Kitchen glucose blood (ACCU-CHEK GUIDE) test strip Use as instructed 4 x daily E10.65  . Insulin Detemir (LEVEMIR FLEXTOUCH) 100 UNIT/ML Pen Inject 80 Units into the skin at bedtime.  Marland Kitchen lisinopril-hydrochlorothiazide (PRINZIDE,ZESTORETIC) 10-12.5 MG tablet Take 1 tablet by mouth daily.  Marland Kitchen NOVOLOG FLEXPEN 100 UNIT/ML FlexPen Inject 20-26 Units into the skin 3 (three) times daily with meals. INJECT 18-24 UNITS SUBCUTANEOUSLY THREE TIMES DAILY WITH MEALS  . RELION PEN NEEDLE 31G/8MM 31G X 8 MM MISC USE 1 PEN NEEDLE 4 TIMES DAILY  . [DISCONTINUED] glucose blood (ACCU-CHEK GUIDE) test strip Use as instructed 4 x daily E10.65  . [DISCONTINUED] NOVOLOG FLEXPEN 100 UNIT/ML FlexPen INJECT 18-24 UNITS SUBCUTANEOUSLY THREE TIMES DAILY WITH MEALS   No facility-administered encounter medications on file as of 02/14/2019.   ALLERGIES: No Known Allergies VACCINATION STATUS:  There is no immunization history on file for this patient.  Diabetes She presents for her follow-up diabetic visit. She has type 1 (She is administratively classified as type 1 diabetes for management purposes.) diabetes mellitus. Onset time: She was diagnosed at approximate age of 47 years. Her disease course has been worsening. There are no hypoglycemic associated symptoms. Pertinent negatives for hypoglycemia include no confusion, headaches, pallor or seizures. Associated symptoms include polydipsia and polyuria. Pertinent negatives for diabetes include no blurred vision, no chest pain, no fatigue, no polyphagia and no weight loss. There are no hypoglycemic complications.  Symptoms are improving. Diabetic complications include peripheral neuropathy. Risk factors for coronary artery disease include diabetes mellitus, sedentary lifestyle and hypertension. Current diabetic treatment includes insulin injections (She discloses today to me that she has been using Levemir from 2017.). She is compliant with treatment most of the time. She is following a generally unhealthy diet. When asked about meal planning, she reported none. She has had a previous visit with a dietitian. She never participates in exercise. Her breakfast blood glucose range is generally >200 mg/dl. Her lunch blood glucose range is generally >200 mg/dl. Her dinner blood glucose range is generally >200 mg/dl. Her bedtime blood glucose range is generally >200 mg/dl. Her overall blood glucose range is >200 mg/dl. An ACE inhibitor/angiotensin II receptor blocker is being taken. Eye exam is current.  Hypertension This is a chronic problem. The current episode started more than 1 year ago. Pertinent negatives include no blurred vision, chest pain, headaches, palpitations or shortness of breath. Risk factors for coronary artery disease include diabetes mellitus, dyslipidemia, sedentary lifestyle and post-menopausal state. Past treatments include ACE inhibitors and beta blockers.    Review of Systems  Constitutional: Negative for chills, fatigue, fever, unexpected weight change and weight loss.  HENT: Negative for trouble swallowing and voice change.   Eyes: Negative for blurred vision and visual disturbance.  Respiratory: Negative for cough, shortness of breath and wheezing.   Cardiovascular: Negative for chest pain, palpitations and leg swelling.  Gastrointestinal: Negative for diarrhea, nausea and vomiting.  Endocrine: Positive for polydipsia and polyuria. Negative for cold intolerance, heat intolerance and polyphagia.  Musculoskeletal: Positive for gait problem. Negative for arthralgias and myalgias.  Skin:  Negative for color change, pallor, rash and wound.  Neurological: Negative for seizures and headaches.  Psychiatric/Behavioral: Negative for confusion and suicidal ideas.    Objective:    BP (!) 164/99   Pulse (!) 111   Ht 5' (1.524 m)   Wt 173 lb 3.2 oz (78.6 kg)   BMI 33.83 kg/m   Wt Readings from Last 3 Encounters:  02/14/19 173 lb 3.2 oz (78.6 kg)  05/10/18 165 lb (74.8 kg)  02/08/18 159 lb (72.1 kg)      Recent Results (from the past 2160 hour(s))  HgB A1c     Status: Abnormal   Collection Time: 02/14/19  3:59 PM  Result Value Ref Range   Hemoglobin A1C 10.6 (A) 4.0 - 5.6 %   HbA1c POC (<> result, manual entry)     HbA1c, POC (prediabetic range)     HbA1c, POC (controlled diabetic range)      Assessment & Plan:    1. Uncontrolled type 1 diabetes mellitus-chronically uncontrolled - Patient has currently uncontrolled symptomatic type 1 DM ( administrative reclassified as type I for management purposes) since  59 years of age. -She presents with significantly above target glycemic profile was fasting and postprandial,  mainly due to the fact that she is avoiding tightening of glycemic profile while constantly snacking.  Her previsit labs show A1c of 10.6%   She admits to dietary indiscretion including consumption of large quantities of soda in the evening hours.   - Her diabetes is complicated by neuropathy, patient remains at a high risk for more acute and chronic complications of diabetes which include CAD, CVA, CKD, retinopathy, and neuropathy. These are all discussed in detail with the patient.  - I have counseled the patient on diet management  by adopting a carbohydrate restricted/protein rich diet. - she  admits there is a room for improvement in her diet and drink choices. -  Suggestion is made for her to avoid simple carbohydrates  from her diet including Cakes, Sweet Desserts / Pastries, Ice Cream, Soda (diet and regular), Sweet Tea, Candies, Chips, Cookies,  Sweet Pastries,  Store Bought Juices, Alcohol in Excess of  1-2 drinks a day, Artificial Sweeteners, Coffee Creamer, and "Sugar-free" Products. This will help patient to have stable blood glucose profile and potentially avoid unintended weight gain.  - I encouraged the patient to switch to  and consume more unprocessed or minimally processed complex starch and increased protein intake (animal or plant source), fruits, and vegetables.  - Patient is advised to stick to a routine mealtimes to eat 3 meals  a day and avoid unnecessary snacks.    - I have approached patient with the following individualized plan to manage diabetes and patient agrees:   -She will continue to need intensive treatment with higher dose of basal/bolus insulin in order for her to achieve and maintain control of diabetes to target.   - She drinks a lot of soda as well as processed snacks and crackers unnecessarily.  She wishes to keep her blood glucose close to 200 , because she does not "feel well" if it drops to below 200 mg/dL. -She is advised to continue Levemir at 80 units nightly, at 8 PM to avoid risk of skipping which has been having a lot daily, advised to increase NovoLog to 20  units 3 times a day before meals  for pre-meal blood glucose above 90 mg/dL, plus correction for pre-meal blood glucose above 150 mg/dL. - Patient is warned not to take insulin without proper monitoring per orders. -Adjustment parameters are given for hypo and hyperglycemia in writing. -Patient is encouraged to call clinic for blood glucose levels less than 70 or above 300 mg /dl.  - She is not a candidate for metformin, SGLT2 inhibitors, nor  incretin therapy.  - Patient specific target  A1c;  LDL, HDL, Triglycerides, and  Waist Circumference were discussed in detail.  2) BP/HTN: Her blood pressure is not controlled to target.  She is advised to continue her current blood pressure medications including lisinopril/HCTZ.  3) Lipids/HPL:  Her recent lipid panel showed uncontrolled  LDL at 117.  She is advised to continue atorvastatin 10 mg nightly.       4)  Weight/Diet:  She has appropriately gained 70 pounds so far good development for her.  Further weight gain not recommended for her.  2 years ago, she did have significant weight deficit.  CDE Consult in progress , exercise, and detailed carbohydrates information provided.  5) Vitamin D deficiency.  She is on ongoing supplement with cholecalciferol 2000 units daily.   6) Chronic Care/Health Maintenance:  -Patient is on ACEI/ARB medications and encouraged to continue to follow up with Ophthalmology, Podiatrist at least yearly or according to recommendations, and advised to   stay away from smoking. I have recommended yearly  flu vaccine and pneumonia vaccination at least every 5 years; moderate intensity exercise for up to 150 minutes weekly; and  sleep for at least 7 hours a day.  - I advised patient to maintain close follow up with House, Deliah Goody, FNP for primary care needs.  - Time spent on this patient care encounter:  35 min, of which > 50% was spent in  counseling and the rest reviewing her blood glucose logs , discussing her hypoglycemia and hyperglycemia episodes, reviewing her current and  previous labs / studies  ( including abstraction from other facilities) and medications  doses and developing a  long term treatment plan and documenting her care.   Please refer to Patient Instructions for Blood Glucose Monitoring and Insulin/Medications Dosing Guide"  in media tab for additional information. Please  also refer to " Patient Self Inventory" in the Media  tab for reviewed elements of pertinent patient history.  [name] participated in the discussions, expressed understanding, and voiced agreement with the above plans.  All questions were answered to her satisfaction. she is encouraged to contact clinic should she have any questions or concerns prior to her return  visit.  Follow up plan: - Return in about 4 months (around 06/14/2019) for Bring Meter and Logs- A1c in Office, Follow up with Pre-visit Labs.  Glade Lloyd, MD Phone: [phone]  Fax: [phone]   -  This note was partially dictated with voice recognition software. Similar sounding words can be transcribed inadequately or may not  be corrected upon review.  02/14/2019, 4:12 PM

## 2019-02-14 NOTE — Patient Instructions (Signed)
                                     Advice for Weight Management  -For most of us the best way to lose weight is by diet management. Generally speaking, diet management means consuming less calories intentionally which over time brings about progressive weight loss.  This can be achieved more effectively by restricting carbohydrate consumption to the minimum possible.  So, it is critically important to know your numbers: how much calorie you are consuming and how much calorie you need. More importantly, our carbohydrates sources should be unprocessed or minimally processed complex starch food items.   Sometimes, it is important to balance nutrition by increasing protein intake (animal or plant source), fruits, and vegetables.  -Sticking to a routine mealtime to eat 3 meals a day and avoiding unnecessary snacks is shown to have a big role in weight control. Under normal circumstances, the only time we lose real weight is when we are hungry, so allow hunger to take place- hunger means no food between meal times, only water.  It is not advisable to starve.   -It is better to avoid simple carbohydrates including: Cakes, Sweet Desserts, Ice Cream, Soda (diet and regular), Sweet Tea, Candies, Chips, Cookies, Store Bought Juices, Alcohol in Excess of  1-2 drinks a day, Artificial Sweeteners, Doughnuts, Coffee Creamers, "Sugar-free" Products, etc, etc.  This is not a complete list.....    -Consulting with certified diabetes educators is proven to provide you with the most accurate and current information on diet.  Also, you may be  interested in discussing diet options/exchanges , we can schedule a visit with Katherine Patton, RDN, CDE for individualized nutrition education.  -Exercise: If you are able: 30 -60 minutes a day ,4 days a week, or 150 minutes a week.  The longer the better.  Combine stretch, strength, and aerobic activities.  If you were told in the past that you  have high risk for cardiovascular diseases, you may seek evaluation by your heart doctor prior to initiating moderate to intense exercise programs.                                  Additional Care Considerations for Diabetes   -Diabetes  is a chronic disease.  The most important care consideration is regular follow-up with your diabetes care provider with the goal being avoiding or delaying its complications and to take advantage of advances in medications and technology.    -Type 2 diabetes is known to coexist with other important comorbidities such as high blood pressure and high cholesterol.  It is critical to control not only the diabetes but also the high blood pressure and high cholesterol to minimize and delay the risk of complications including coronary artery disease, stroke, amputations, blindness, etc.    - Studies showed that people with diabetes will benefit from a class of medications known as ACE inhibitors and statins.  Unless there are specific reasons not to be on these medications, the standard of care is to consider getting one from these groups of medications at an optimal doses.  These medications are generally considered safe and proven to help protect the heart and the kidneys.    - People with diabetes are encouraged to initiate and maintain regular follow-up with eye doctors, foot doctors, dentists ,   and if necessary heart and kidney doctors.     - It is highly recommended that people with diabetes quit smoking or stay away from smoking, and get yearly  flu vaccine and pneumonia vaccine at least every 5 years.  One other important lifestyle recommendation is to ensure adequate sleep - at least 6-7 hours of uninterrupted sleep at night.  -Exercise: If you are able: 30 -60 minutes a day, 4 days a week, or 150 minutes a week.  The longer the better.  Combine stretch, strength, and aerobic activities.  If you were told in the past that you have high risk for cardiovascular  diseases, you may seek evaluation by your heart doctor prior to initiating moderate to intense exercise programs.          

## 2019-06-02 ENCOUNTER — Other Ambulatory Visit: Payer: Self-pay

## 2019-06-02 DIAGNOSIS — IMO0002 Reserved for concepts with insufficient information to code with codable children: Secondary | ICD-10-CM

## 2019-06-02 MED ORDER — LEVEMIR FLEXTOUCH 100 UNIT/ML ~~LOC~~ SOPN: 80.0000 [IU] | PEN_INJECTOR | Freq: Every day | SUBCUTANEOUS | 3 refills | Status: DC

## 2019-06-02 MED ORDER — NOVOLOG FLEXPEN 100 UNIT/ML ~~LOC~~ SOPN
20.0000 [IU] | PEN_INJECTOR | Freq: Three times a day (TID) | SUBCUTANEOUS | 2 refills | Status: DC
Start: 2019-06-02 — End: 2020-01-17

## 2019-06-02 MED ORDER — ACCU-CHEK GUIDE VI STRP: ORAL_STRIP | 5 refills | Status: DC

## 2019-06-16 ENCOUNTER — Ambulatory Visit: Payer: Medicaid Other | Admitting: "Endocrinology

## 2019-08-23 ENCOUNTER — Other Ambulatory Visit: Payer: Self-pay

## 2019-08-23 ENCOUNTER — Telehealth: Payer: Self-pay | Admitting: "Endocrinology

## 2019-08-23 DIAGNOSIS — E559 Vitamin D deficiency, unspecified: Secondary | ICD-10-CM

## 2019-08-23 DIAGNOSIS — IMO0002 Reserved for concepts with insufficient information to code with codable children: Secondary | ICD-10-CM

## 2019-08-23 DIAGNOSIS — E782 Mixed hyperlipidemia: Secondary | ICD-10-CM

## 2019-08-23 NOTE — Telephone Encounter (Signed)
Pt needs her lab order updated for [facility].

## 2019-08-23 NOTE — Progress Notes (Signed)
Updated orders sent to [facility].

## 2019-08-23 NOTE — Telephone Encounter (Signed)
Orders updated and sent to [facility]. 

## 2019-09-16 ENCOUNTER — Other Ambulatory Visit: Payer: Self-pay | Admitting: "Endocrinology

## 2019-09-16 DIAGNOSIS — IMO0002 Reserved for concepts with insufficient information to code with codable children: Secondary | ICD-10-CM

## 2019-09-20 ENCOUNTER — Other Ambulatory Visit: Payer: Self-pay | Admitting: "Endocrinology

## 2019-09-21 LAB — COMPREHENSIVE METABOLIC PANEL WITH GFR
ALT: 14 [IU]/L (ref 0–32)
AST: 23 [IU]/L (ref 0–40)
Albumin/Globulin Ratio: 1.3 (ref 1.2–2.2)
Albumin: 4.3 g/dL (ref 3.8–4.9)
Alkaline Phosphatase: 130 [IU]/L — ABNORMAL HIGH (ref 48–121)
BUN/Creatinine Ratio: 13 (ref 9–23)
BUN: 23 mg/dL (ref 6–24)
Bilirubin Total: 0.3 mg/dL (ref 0.0–1.2)
CO2: 24 mmol/L (ref 20–29)
Calcium: 9.6 mg/dL (ref 8.7–10.2)
Chloride: 101 mmol/L (ref 96–106)
Creatinine, Ser: 1.79 mg/dL — ABNORMAL HIGH (ref 0.57–1.00)
GFR calc Af Amer: 36 mL/min/{1.73_m2} — ABNORMAL LOW
GFR calc non Af Amer: 31 mL/min/{1.73_m2} — ABNORMAL LOW
Globulin, Total: 3.3 g/dL (ref 1.5–4.5)
Glucose: 80 mg/dL (ref 65–99)
Potassium: 4.2 mmol/L (ref 3.5–5.2)
Sodium: 141 mmol/L (ref 134–144)
Total Protein: 7.6 g/dL (ref 6.0–8.5)

## 2019-09-21 LAB — MICROALBUMIN, URINE: Microalbumin, Urine: 285.1 ug/mL

## 2019-09-21 LAB — LIPID PANEL W/O CHOL/HDL RATIO
Cholesterol, Total: 219 mg/dL — ABNORMAL HIGH (ref 100–199)
HDL: 48 mg/dL
LDL Chol Calc (NIH): 134 mg/dL — ABNORMAL HIGH (ref 0–99)
Triglycerides: 208 mg/dL — ABNORMAL HIGH (ref 0–149)
VLDL Cholesterol Cal: 37 mg/dL (ref 5–40)

## 2019-09-21 LAB — TSH: TSH: 4.05 u[IU]/mL (ref 0.450–4.500)

## 2019-09-21 LAB — CALCITRIOL (1,25 DI-OH VIT D): Vit D, 1,25-Dihydroxy: 36 pg/mL (ref 19.9–79.3)

## 2019-09-21 LAB — SPECIMEN STATUS REPORT

## 2019-09-21 LAB — T4, FREE: Free T4: 1.7 ng/dL (ref 0.82–1.77)

## 2019-09-27 ENCOUNTER — Encounter: Payer: Self-pay | Admitting: Nurse Practitioner

## 2019-09-27 ENCOUNTER — Ambulatory Visit (INDEPENDENT_AMBULATORY_CARE_PROVIDER_SITE_OTHER): Payer: Medicaid Other | Admitting: Nurse Practitioner

## 2019-09-27 ENCOUNTER — Other Ambulatory Visit: Payer: Self-pay

## 2019-09-27 VITALS — BP 144/93 | HR 96 | Ht 60.0 in | Wt 165.8 lb

## 2019-09-27 DIAGNOSIS — E782 Mixed hyperlipidemia: Secondary | ICD-10-CM | POA: Diagnosis not present

## 2019-09-27 DIAGNOSIS — I10 Essential (primary) hypertension: Secondary | ICD-10-CM | POA: Diagnosis not present

## 2019-09-27 DIAGNOSIS — IMO0002 Reserved for concepts with insufficient information to code with codable children: Secondary | ICD-10-CM

## 2019-09-27 DIAGNOSIS — E1065 Type 1 diabetes mellitus with hyperglycemia: Secondary | ICD-10-CM

## 2019-09-27 DIAGNOSIS — E108 Type 1 diabetes mellitus with unspecified complications: Secondary | ICD-10-CM

## 2019-09-27 DIAGNOSIS — E559 Vitamin D deficiency, unspecified: Secondary | ICD-10-CM

## 2019-09-27 LAB — POCT GLYCOSYLATED HEMOGLOBIN (HGB A1C): Hemoglobin A1C: 10.9 % — AB (ref 4.0–5.6)

## 2019-09-27 MED ORDER — LISINOPRIL-HYDROCHLOROTHIAZIDE 10-12.5 MG PO TABS: 1.0000 | ORAL_TABLET | Freq: Every day | ORAL | 3 refills | Status: DC

## 2019-09-27 MED ORDER — ATORVASTATIN CALCIUM 10 MG PO TABS: 10.0000 mg | ORAL_TABLET | Freq: Every day | ORAL | 3 refills | Status: DC

## 2019-09-27 NOTE — Progress Notes (Signed)
09/27/2019  Endocrinology follow-up note    Subjective:    Patient ID: Katherine Patton, female    DOB: 1960-06-08. Patient is being seen  in follow-up for management of chronically uncontrolled diabetes . PCP: Bradd Burner, FNP.  Past Medical History:  Diagnosis Date   Diabetes mellitus without complication ([place])    Uterine mass    Past Surgical History:  Procedure Laterality Date   CATARACT EXTRACTION     Social History   Socioeconomic History   Marital status: Single    Spouse name: Not on file   Number of children: Not on file   Years of education: Not on file   Highest education level: Not on file  Occupational History   Not on file  Tobacco Use   Smoking status: Never Smoker   Smokeless tobacco: Never Used  Vaping Use   Vaping Use: Never used  Substance and Sexual Activity   Alcohol use: No   Drug use: No   Sexual activity: Not Currently    Birth control/protection: None  Other Topics Concern   Not on file  Social History Narrative   Not on file   Social Determinants of Health   Financial Resource Strain:    Difficulty of Paying Living Expenses: Not on file  Food Insecurity:    Worried About [place] in the Last Year: Not on file   Ran Out of Food in the Last Year: Not on file  Transportation Needs:    Lack of Transportation (Medical): Not on file   Lack of Transportation (Non-Medical): Not on file  Physical Activity:    Days of Exercise per Week: Not on file   Minutes of Exercise per Session: Not on file  Stress:    Feeling of Stress : Not on file  Social Connections:    Frequency of Communication with Friends and Family: Not on file   Frequency of Social Gatherings with Friends and Family: Not on file   Attends Religious Services: Not on file   Active Member of Clubs or Organizations: Not on file   Attends Archivist Meetings: Not on file   Marital Status: Not on file   Outpatient Encounter  Medications as of 09/27/2019  Medication Sig   acetaminophen (TYLENOL) 500 MG tablet Take 500 mg by mouth every 6 (six) hours as needed for headache.   Cholecalciferol 2000 units CAPS Take 1 capsule (2,000 Units total) by mouth daily with breakfast.   glucose blood (ACCU-CHEK GUIDE) test strip USE 1 STRIP TO CHECK GLUCOSE 4 TIMES DAILY   insulin detemir (LEVEMIR FLEXTOUCH) 100 UNIT/ML FlexPen Inject 80 Units into the skin at bedtime.   NOVOLOG FLEXPEN 100 UNIT/ML FlexPen Inject 20-26 Units into the skin 3 (three) times daily with meals.   RELION PEN NEEDLE 31G/8MM 31G X 8 MM MISC USE 1 PEN NEEDLE 4 TIMES DAILY   atorvastatin (LIPITOR) 10 MG tablet Take 1 tablet (10 mg total) by mouth daily.   gabapentin (NEURONTIN) 300 MG capsule Take 300 mg by mouth 2 (two) times daily. (Patient not taking: Reported on 09/27/2019)   lisinopril-hydrochlorothiazide (ZESTORETIC) 10-12.5 MG tablet Take 1 tablet by mouth daily.   [DISCONTINUED] atorvastatin (LIPITOR) 10 MG tablet Take 1 tablet (10 mg total) by mouth daily. (Patient not taking: Reported on 09/27/2019)   [DISCONTINUED] lisinopril-hydrochlorothiazide (PRINZIDE,ZESTORETIC) 10-12.5 MG tablet Take 1 tablet by mouth daily. (Patient not taking: Reported on 09/27/2019)   No facility-administered encounter medications on file as of 09/27/2019.  ALLERGIES: No Known Allergies VACCINATION STATUS:  There is no immunization history on file for this patient.  Diabetes She presents for her follow-up diabetic visit. She has type 1 (She is administratively classified as type 1 diabetes for management purposes.) diabetes mellitus. Onset time: She was diagnosed at approximate age of 59 years. Her disease course has been worsening. There are no hypoglycemic associated symptoms. Pertinent negatives for hypoglycemia include no confusion, headaches, pallor or seizures. Associated symptoms include foot paresthesias, polydipsia and polyuria. Pertinent negatives for  diabetes include no blurred vision, no chest pain, no fatigue, no polyphagia and no weight loss. There are no hypoglycemic complications. Symptoms are worsening. Diabetic complications include peripheral neuropathy. Risk factors for coronary artery disease include diabetes mellitus, sedentary lifestyle and hypertension. Current diabetic treatment includes intensive insulin program. She is compliant with treatment some of the time. Her weight is fluctuating minimally. She is following a generally unhealthy diet. When asked about meal planning, she reported none. She has had a previous visit with a dietitian. She never participates in exercise. Her home blood glucose trend is fluctuating dramatically. Her breakfast blood glucose range is generally 130-140 mg/dl. Her lunch blood glucose range is generally >200 mg/dl. Her dinner blood glucose range is generally 140-180 mg/dl. Her bedtime blood glucose range is generally >200 mg/dl. Her overall blood glucose range is >200 mg/dl. (Patient presents today with her meter and logs showing widely fluctuating glycemic profile both fasting and postprandial.  Her POCT A1C today is 10.9%, worsening from last visit of 10.6%.  She reports there was a 2 week timespan where she did not take her Levemir due to inability to getting it refilled.  Her average blood glucose once she was able to refill her insulin is much improved, showing near target fasting and postprandial glycemic profile.  There are no major episodes of hypoglycemia noted.) An ACE inhibitor/angiotensin II receptor blocker is not being taken (she has not been taking her lisinopril as prescribed due to problems getting refilled). She does not see a podiatrist.Eye exam is current.  Hypertension This is a chronic problem. The current episode started more than 1 year ago. The problem has been waxing and waning since onset. The problem is uncontrolled. Pertinent negatives include no blurred vision, chest pain, headaches,  palpitations or shortness of breath. Risk factors for coronary artery disease include diabetes mellitus, dyslipidemia, sedentary lifestyle and post-menopausal state. Past treatments include ACE inhibitors and diuretics. The current treatment provides moderate improvement. Compliance problems include diet, exercise and psychosocial issues.  Identifiable causes of hypertension include chronic renal disease.    Review of Systems  Constitutional: Negative for fatigue and weight loss.  Eyes: Negative for blurred vision and visual disturbance.  Respiratory: Negative for cough, shortness of breath and wheezing.   Cardiovascular: Negative for chest pain, palpitations and leg swelling.  Gastrointestinal: Negative for constipation and diarrhea.  Endocrine: Positive for polydipsia and polyuria. Negative for cold intolerance, heat intolerance and polyphagia.  Musculoskeletal: Positive for gait problem. Negative for arthralgias and myalgias.       Walks with cane  Skin: Negative for color change, pallor, rash and wound.  Neurological: Positive for numbness. Negative for seizures and headaches.       In bilateral feet  Psychiatric/Behavioral: Negative for confusion.    Objective:    BP (!) 144/93 (BP Location: Left Arm, Patient Position: Sitting)    Pulse 96    Ht 5' (1.524 m)    Wt 165 lb 12.8 oz (75.2 kg)  BMI 32.38 kg/m   Wt Readings from Last 3 Encounters:  09/27/19 165 lb 12.8 oz (75.2 kg)  02/14/19 173 lb 3.2 oz (78.6 kg)  05/10/18 165 lb (74.8 kg)    BP Readings from Last 3 Encounters:  09/27/19 (!) 144/93  02/14/19 (!) 164/99  05/10/18 137/74     Physical Exam- Limited  Constitutional:  Body mass index is 32.38 kg/m. , not in acute distress, normal state of mind Eyes:  EOMI, no exophthalmos Neck: Supple Thyroid: No gross goiter Cardiovascular: RRR, no murmers, rubs, or gallops, no edema Respiratory: Adequate breathing efforts, no crackles, rales, rhonchi, or  wheezing Musculoskeletal: no gross deformities, strength intact in all four extremities, no gross restriction of joint movements Skin:  no rashes, no hyperemia Neurological: no tremor with outstretched hands  Recent Results (from the past 2160 hour(s))  Comprehensive metabolic panel     Status: Abnormal   Collection Time: 09/20/19  8:11 AM  Result Value Ref Range   Glucose 80 65 - 99 mg/dL   BUN 23 6 - 24 mg/dL   Creatinine, Ser 1.79 (H) 0.57 - 1.00 mg/dL   GFR calc non Af Amer 31 (L) >59 mL/min/1.73   GFR calc Af Amer 36 (L) >59 mL/min/1.73    Comment: **[facility] currently reports eGFR in compliance with the current**   recommendations of the [employer]. [facility] will   update reporting as new guidelines are published from the NKF-ASN   Task force.    BUN/Creatinine Ratio 13 9 - 23   Sodium 141 134 - 144 mmol/L   Potassium 4.2 3.5 - 5.2 mmol/L   Chloride 101 96 - 106 mmol/L   CO2 24 20 - 29 mmol/L   Calcium 9.6 8.7 - 10.2 mg/dL   Total Protein 7.6 6.0 - 8.5 g/dL   Albumin 4.3 3.8 - 4.9 g/dL   Globulin, Total 3.3 1.5 - 4.5 g/dL   Albumin/Globulin Ratio 1.3 1.2 - 2.2   Bilirubin Total 0.3 0.0 - 1.2 mg/dL   Alkaline Phosphatase 130 (H) 48 - 121 IU/L    Comment: **Effective September 25, 2019 Alkaline Phosphatase**   reference interval will be changing to:              Age                Female          Female           0 -  5 days         81 - 127       30 - 127           6 - 10 days         28 - 242       36 - 15          11 - 20 days        109 - 357      109 - 357          21 - 30 days         94 - 354       65 - 494           1 -  2 months      149 - 539      149 - 539           3 -  6 months      131 -  452      131 - 452           7 - 11 months      117 - 401      117 - 401   12 months -  6 years       158 - 369      158 - 369           7 - 12 years       150 - 409      150 - 409               13 years       156 - 435       82 - 227               14 years        114 - 375       51 - 161               15 years        54 - 279       70 - 134               16 years        30 - 207       36 - 121               17 years        96 - 161       3 - 113          31 - 20 years        69 - 125       49 - 106              >20 years         32 - 121       44 - 121    AST 23 0 - 40 IU/L   ALT 14 0 - 32 IU/L  Lipid Panel w/o Chol/HDL Ratio     Status: Abnormal   Collection Time: 09/20/19  8:11 AM  Result Value Ref Range   Cholesterol, Total 219 (H) 100 - 199 mg/dL   Triglycerides 208 (H) 0 - 149 mg/dL   HDL 48 >39 mg/dL   VLDL Cholesterol Cal 37 5 - 40 mg/dL   LDL Chol Calc (NIH) 134 (H) 0 - 99 mg/dL  T4, free     Status: None   Collection Time: 09/20/19  8:11 AM  Result Value Ref Range   Free T4 1.70 0.82 - 1.77 ng/dL  TSH     Status: None   Collection Time: 09/20/19  8:11 AM  Result Value Ref Range   TSH 4.050 0.450 - 4.500 uIU/mL  Calcitriol (1,25 di-OH Vit D)     Status: None   Collection Time: 09/20/19  8:11 AM  Result Value Ref Range   Vit D, 1,25-Dihydroxy 36.0 19.9 - 79.3 pg/mL  Microalbumin, urine     Status: None   Collection Time: 09/20/19  8:11 AM  Result Value Ref Range   Microalbumin, Urine 285.1 Not Estab. ug/mL  Specimen status report     Status: None   Collection Time: 09/20/19  8:11 AM  Result Value Ref Range   specimen status report Comment     Comment: [facility] CMP14 Default Ambig Abbrev CMP14 Default A hand-written panel/profile was received from your office.  In accordance with the [facility] Ambiguous Test Code Policy dated July 4268, we have completed your order by using the closest currently or formerly recognized AMA panel.  We have assigned Comprehensive Metabolic Panel (14), Test Code #322000 to this request.  If this is not the testing you wished to receive on this specimen, please contact the [facility] Client Inquiry/Technical Services Department to clarify the test order.  We appreciate your business. Ambig  Abbrev LP Default Ambig Abbrev LP Default A hand-written panel/profile was received from your office. In accordance with the [facility] Ambiguous Test Code Policy dated July 3419, we have completed your order by using the closest currently or formerly recognized AMA panel.  We have assigned Lipid Panel, Test Code [id number] to this request. If this is not the testing you wished to receive on this specimen, plea se contact the [facility] Client Inquiry/Technical Services Department to clarify the test order.  We appreciate your business.   HgB A1c     Status: Abnormal   Collection Time: 09/27/19  9:21 AM  Result Value Ref Range   Hemoglobin A1C 10.9 (A) 4.0 - 5.6 %   HbA1c POC (<> result, manual entry)     HbA1c, POC (prediabetic range)     HbA1c, POC (controlled diabetic range)      Assessment & Plan:    1. Uncontrolled type 1 diabetes mellitus-chronically uncontrolled - Patient has currently uncontrolled symptomatic type 1 DM ( administrative reclassified as type I for management purposes) since  58 years of age.  Patient presents today with her meter and logs showing widely fluctuating glycemic profile both fasting and postprandial.  Her POCT A1C today is 10.9%, worsening from last visit of 10.6%.  She reports there was a 2 week timespan where she did not take her Levemir due to inability to getting it refilled.  Her average blood glucose once she was able to refill her insulin is much improved, showing near target fasting and postprandial glycemic profile.  There are no major episodes of hypoglycemia noted.   - Her diabetes is complicated by neuropathy, patient remains at a high risk for more acute and chronic complications of diabetes which include CAD, CVA, CKD, retinopathy, and neuropathy. These are all discussed in detail with the patient.  - The patient admits there is a room for improvement in their diet and drink choices. -  Suggestion is made for the patient to avoid simple  carbohydrates from their diet including Cakes, Sweet Desserts / Pastries, Ice Cream, Soda (diet and regular), Sweet Tea, Candies, Chips, Cookies, Sweet Pastries,  Store Bought Juices, Alcohol in Excess of  1-2 drinks a day, Artificial Sweeteners, Coffee Creamer, and "Sugar-free" Products. This will help patient to have stable blood glucose profile and potentially avoid unintended weight gain.   - I encouraged the patient to switch to  unprocessed or minimally processed complex starch and increased protein intake (animal or plant source), fruits, and vegetables.   - Patient is advised to stick to a routine mealtimes to eat 3 meals  a day and avoid unnecessary snacks ( to snack only to correct hypoglycemia).   - I have approached patient with the following individualized plan to manage diabetes and patient agrees:   -She will continue to need intensive treatment with higher dose of basal/bolus insulin in order for her to achieve and maintain control of diabetes to target.   -Given near target glycemic profile when she takes her insulin, she is advised to continue Levemir at 80  units nightly and continue NovoLog to 20-26 units 3 times a day before meals  for pre-meal blood glucose above 90 mg/dL and she is eating.  Specific instructions on how to titrate insulin dose based on blood sugar readings given to patient in writing.  -She is encouraged to continue monitoring blood glucose 4 times daily, before meals and at bedtime and report to the clinic if blood glucose levels are less than 70 or greater than 200 for 3 tests in a row.  - She is not a candidate for metformin, SGLT2 inhibitors, nor  incretin therapy.  - Patient specific target  A1c;  LDL, HDL, Triglycerides, and  Waist Circumference were discussed in detail.  2) BP/HTN:  Her blood pressure is not controlled to target.  She has not been taking her BP meds since she ran out and did not get it refilled. She is advised to restart her  Lisinopril-HCTZ 10-12.5 mg po daily, refill Rx sent to pharmacy.  3) Lipids/HPL: Her recent lipid panel from 09/20/19 shows uncontrolled LDL at 134 and triglycerides of 208.  She has not been taking her Lipitor as prescribed for months.  She is advised to restart Lipitor 10 mg po daily at bedtime.   4)  Weight/Diet: Her Body mass index is 32.38 kg/m.   Further weight gain not recommended for her.  2 years ago, she did have significant weight deficit.  CDE Consult in progress , exercise, and detailed carbohydrates information provided.  5) Vitamin D deficiency.   Her recent vitamin D level was 36.  She is on ongoing supplement with cholecalciferol 2000 units daily and is advised to continue this.  6) Chronic Care/Health Maintenance: -Patient is on ACEI/ARB medications, is encouraged to restart her Statin medication and encouraged to continue to follow up with Ophthalmology, Podiatrist at least yearly or according to recommendations, and advised to   stay away from smoking. I have recommended yearly flu vaccine and pneumonia vaccination at least every 5 years; moderate intensity exercise for up to 150 minutes weekly; and  sleep for at least 7 hours a day.  - I advised patient to maintain close follow up with House, Deliah Goody, FNP for primary care needs.  - Time spent on this patient care encounter:  35 min, of which > 50% was spent in  counseling and the rest reviewing her blood glucose logs , discussing her hypoglycemia and hyperglycemia episodes, reviewing her current and  previous labs / studies  ( including abstraction from other facilities) and medications  doses and developing a  long term treatment plan and documenting her care.   Please refer to Patient Instructions for Blood Glucose Monitoring and Insulin/Medications Dosing Guide"  in media tab for additional information. Please  also refer to " Patient Self Inventory" in the Media  tab for reviewed elements of pertinent patient  history.  [name] participated in the discussions, expressed understanding, and voiced agreement with the above plans.  All questions were answered to her satisfaction. she is encouraged to contact clinic should she have any questions or concerns prior to her return visit.  Follow up plan: - Return in about 3 months (around 12/27/2019) for Diabetes follow up with A1c in office, No previsit labs.  Rayetta Pigg, FNP-BC [facility] Phone: [phone] Fax: [phone]  -  This note was partially dictated with voice recognition software. Similar sounding words can be transcribed inadequately or may not  be corrected upon review.  09/27/2019, 9:43 AM

## 2019-09-27 NOTE — Patient Instructions (Signed)

## 2019-11-22 ENCOUNTER — Other Ambulatory Visit: Payer: Self-pay | Admitting: "Endocrinology

## 2019-11-22 DIAGNOSIS — E1065 Type 1 diabetes mellitus with hyperglycemia: Secondary | ICD-10-CM

## 2019-11-22 DIAGNOSIS — IMO0002 Reserved for concepts with insufficient information to code with codable children: Secondary | ICD-10-CM

## 2019-12-27 ENCOUNTER — Encounter: Payer: Self-pay | Admitting: Nurse Practitioner

## 2019-12-27 ENCOUNTER — Other Ambulatory Visit: Payer: Self-pay

## 2019-12-27 ENCOUNTER — Ambulatory Visit (INDEPENDENT_AMBULATORY_CARE_PROVIDER_SITE_OTHER): Payer: Medicaid Other | Admitting: Nurse Practitioner

## 2019-12-27 VITALS — BP 178/97 | HR 108 | Ht 60.0 in | Wt 165.0 lb

## 2019-12-27 DIAGNOSIS — E782 Mixed hyperlipidemia: Secondary | ICD-10-CM

## 2019-12-27 DIAGNOSIS — E1022 Type 1 diabetes mellitus with diabetic chronic kidney disease: Secondary | ICD-10-CM

## 2019-12-27 DIAGNOSIS — E108 Type 1 diabetes mellitus with unspecified complications: Secondary | ICD-10-CM | POA: Diagnosis not present

## 2019-12-27 DIAGNOSIS — N183 Chronic kidney disease, stage 3 unspecified: Secondary | ICD-10-CM

## 2019-12-27 DIAGNOSIS — E559 Vitamin D deficiency, unspecified: Secondary | ICD-10-CM

## 2019-12-27 DIAGNOSIS — E1065 Type 1 diabetes mellitus with hyperglycemia: Secondary | ICD-10-CM | POA: Diagnosis not present

## 2019-12-27 DIAGNOSIS — IMO0002 Reserved for concepts with insufficient information to code with codable children: Secondary | ICD-10-CM

## 2019-12-27 DIAGNOSIS — I10 Essential (primary) hypertension: Secondary | ICD-10-CM | POA: Diagnosis not present

## 2019-12-27 LAB — POCT GLYCOSYLATED HEMOGLOBIN (HGB A1C): HbA1c, POC (controlled diabetic range): 10.7 % — AB (ref 0.0–7.0)

## 2019-12-27 MED ORDER — DEXCOM G6 TRANSMITTER MISC: 1.0000 | 4 refills | Status: DC

## 2019-12-27 MED ORDER — DEXCOM G6 SENSOR MISC: 6 refills | Status: DC

## 2019-12-27 MED ORDER — DEXCOM G6 RECEIVER DEVI: 1.0000 | Freq: Once | 0 refills | Status: AC

## 2019-12-27 NOTE — Progress Notes (Signed)
12/27/2019  Endocrinology follow-up note    Subjective:    Patient ID: Katherine Patton, female    DOB: 11-04-1960. Patient is being seen  in follow-up for management of chronically uncontrolled diabetes . PCP: Bradd Burner, FNP.  Past Medical History:  Diagnosis Date  . Diabetes mellitus without complication ([facility])   . Uterine mass    Past Surgical History:  Procedure Laterality Date  . CATARACT EXTRACTION     Social History   Socioeconomic History  . Marital status: Single    Spouse name: Not on file  . Number of children: Not on file  . Years of education: Not on file  . Highest education level: Not on file  Occupational History  . Not on file  Tobacco Use  . Smoking status: Never Smoker  . Smokeless tobacco: Never Used  Vaping Use  . Vaping Use: Never used  Substance and Sexual Activity  . Alcohol use: No  . Drug use: No  . Sexual activity: Not Currently    Birth control/protection: None  Other Topics Concern  . Not on file  Social History Narrative  . Not on file   Social Determinants of Health   Financial Resource Strain: Not on file  Food Insecurity: Not on file  Transportation Needs: Not on file  Physical Activity: Not on file  Stress: Not on file  Social Connections: Not on file   Outpatient Encounter Medications as of 12/27/2019  Medication Sig  . acetaminophen (TYLENOL) 500 MG tablet Take 500 mg by mouth every 6 (six) hours as needed for headache.  Marland Kitchen atorvastatin (LIPITOR) 10 MG tablet Take 1 tablet (10 mg total) by mouth daily.  . Cholecalciferol 2000 units CAPS Take 1 capsule (2,000 Units total) by mouth daily with breakfast.  . gabapentin (NEURONTIN) 300 MG capsule Take 300 mg by mouth 2 (two) times daily.  Marland Kitchen glucose blood (ACCU-CHEK GUIDE) test strip USE 1 STRIP TO CHECK GLUCOSE 4 TIMES DAILY  . LEVEMIR FLEXTOUCH 100 UNIT/ML FlexPen INJECT 80 UNITS SUBCUTANEOUSLY AT BEDTIME  . lisinopril-hydrochlorothiazide (ZESTORETIC) 10-12.5 MG tablet Take  1 tablet by mouth daily.  Marland Kitchen NOVOLOG FLEXPEN 100 UNIT/ML FlexPen Inject 20-26 Units into the skin 3 (three) times daily with meals.  . RELION PEN NEEDLE 31G/8MM 31G X 8 MM MISC USE 1 PEN NEEDLE 4 TIMES DAILY  . Continuous Blood Gluc Receiver (DEXCOM G6 RECEIVER) DEVI 1 Device by Does not apply route once for 1 dose.  . Continuous Blood Gluc Sensor (DEXCOM G6 SENSOR) MISC Apply new sensor every 10 days for glucose monitoring  . Continuous Blood Gluc Transmit (DEXCOM G6 TRANSMITTER) MISC 1 Piece by Does not apply route as directed.   No facility-administered encounter medications on file as of 12/27/2019.   ALLERGIES: No Known Allergies VACCINATION STATUS:  There is no immunization history on file for this patient.  Diabetes She presents for her follow-up diabetic visit. She has type 1 (She is administratively classified as type 1 diabetes for management purposes.) diabetes mellitus. Onset time: She was diagnosed at approximate age of 33 years. Her disease course has been stable. Hypoglycemia symptoms include nervousness/anxiousness, sweats and tremors. Pertinent negatives for hypoglycemia include no confusion, headaches, pallor or seizures. Associated symptoms include polydipsia and polyuria. Pertinent negatives for diabetes include no blurred vision, no chest pain, no fatigue, no foot paresthesias, no polyphagia and no weight loss. There are no hypoglycemic complications. Symptoms are worsening. Diabetic complications include nephropathy and peripheral neuropathy. Risk factors for coronary artery disease  include diabetes mellitus, sedentary lifestyle, hypertension, dyslipidemia and stress. Current diabetic treatment includes intensive insulin program. She is compliant with treatment some of the time. Her weight is fluctuating minimally. She is following a generally unhealthy diet. When asked about meal planning, she reported none. She has had a previous visit with a dietitian. She never participates  in exercise. Her home blood glucose trend is fluctuating dramatically. Her overall blood glucose range is >200 mg/dl. (She presents today with her meter and logs showing inconsistent monitoring pattern with widely fluctuating glycemic profile.  Her POCT A1c today is 10.7%, essentially unchanged from previous visit of 10.9%.  She has missed opportunities to inject both basal and bolus insulin (had trouble getting her Levemir refilled due to it being out of stock at her pharmacy).  She also reports being sick lately which has impacted her blood sugar readings.  She does have some random hypoglycemia noted.) An ACE inhibitor/angiotensin II receptor blocker is being taken. She does not see a podiatrist.Eye exam is current.  Hypertension This is a chronic problem. The current episode started more than 1 year ago. The problem has been rapidly worsening since onset. The problem is uncontrolled. Associated symptoms include sweats. Pertinent negatives include no blurred vision, chest pain, headaches, palpitations or shortness of breath. There are no associated agents to hypertension. Risk factors for coronary artery disease include diabetes mellitus, dyslipidemia, sedentary lifestyle and post-menopausal state. Past treatments include ACE inhibitors and diuretics. The current treatment provides mild improvement. Compliance problems include diet, exercise and psychosocial issues.  Hypertensive end-organ damage includes kidney disease. Identifiable causes of hypertension include chronic renal disease.    Review of systems  Constitutional: + Minimally fluctuating body weight,  current Body mass index is 32.22 kg/m. , no fatigue, no subjective hyperthermia, no subjective hypothermia Eyes: no blurry vision, no xerophthalmia ENT: no sore throat, no nodules palpated in throat, no dysphagia/odynophagia, no hoarseness Cardiovascular: no chest pain, no shortness of breath, no palpitations, no leg swelling Respiratory: no  cough, no shortness of breath Gastrointestinal: no nausea/vomiting/diarrhea Musculoskeletal: no muscle/joint aches, walks with cane Skin: no rashes, no hyperemia Neurological: no tremors, no dizziness, intermittent numbness/tingling to Bilateral feet Psychiatric: no depression, no anxiety  Objective:    BP (!) 178/97 (BP Location: Left Arm)   Pulse (!) 108   Ht 5' (1.524 m)   Wt 165 lb (74.8 kg)   BMI 32.22 kg/m   Wt Readings from Last 3 Encounters:  12/27/19 165 lb (74.8 kg)  09/27/19 165 lb 12.8 oz (75.2 kg)  02/14/19 173 lb 3.2 oz (78.6 kg)    BP Readings from Last 3 Encounters:  12/27/19 (!) 178/97  09/27/19 (!) 144/93  02/14/19 (!) 164/99     Physical Exam- Limited  Constitutional:  Body mass index is 32.22 kg/m. , not in acute distress, normal state of mind Eyes:  EOMI, no exophthalmos Neck: Supple Cardiovascular: RRR, no murmers, rubs, or gallops, no edema Respiratory: Adequate breathing efforts, no crackles, rales, rhonchi, or wheezing Musculoskeletal: no gross deformities, strength intact in all four extremities, no gross restriction of joint movements, walks with cane Skin:  no rashes, no hyperemia Neurological: no tremor with outstretched hands  Foot exam:   No rashes, ulcers, cuts, calluses, onychodystrophy.   Good pulses bilat.  Good sensation to 10 g monofilament bilat.  Cool to touch    POCT ABI Results 12/27/19   Right ABI:  1.03      Left ABI:  1.04  Right leg  systolic / diastolic: 528/413 mmHg Left leg systolic / diastolic: 244/010 mmHg  Arm systolic / diastolic: 272/53 mmHG  Detailed report will be scanned into patient chart.    Recent Results (from the past 2160 hour(s))  HgB A1c     Status: Abnormal   Collection Time: 12/27/19  9:41 AM  Result Value Ref Range   Hemoglobin A1C     HbA1c POC (<> result, manual entry)     HbA1c, POC (prediabetic range)     HbA1c, POC (controlled diabetic range) 10.7 (A) 0.0 - 7.0 %     Assessment & Plan:    1) Uncontrolled type 1 diabetes mellitus-chronically uncontrolled - Patient has currently uncontrolled symptomatic type 1 DM (administrative reclassified as type I for management purposes) since  59 years of age.  She presents today with her meter and logs showing inconsistent monitoring pattern with widely fluctuating glycemic profile.  Her POCT A1c today is 10.7%, essentially unchanged from previous visit of 10.9%.  She has missed opportunities to inject both basal and bolus insulin (had trouble getting her Levemir refilled due to it being out of stock at her pharmacy).  She also reports being sick lately which has impacted her blood sugar readings.  She does have some random hypoglycemia noted.   - Her diabetes is complicated by neuropathy, patient remains at a high risk for more acute and chronic complications of diabetes which include CAD, CVA, CKD, retinopathy, and neuropathy. These are all discussed in detail with the patient.  - Nutritional counseling repeated at each appointment due to patients tendency to fall back in to old habits.  - The patient admits there is a room for improvement in their diet and drink choices. -  Suggestion is made for the patient to avoid simple carbohydrates from their diet including Cakes, Sweet Desserts / Pastries, Ice Cream, Soda (diet and regular), Sweet Tea, Candies, Chips, Cookies, Sweet Pastries,  Store Bought Juices, Alcohol in Excess of  1-2 drinks a day, Artificial Sweeteners, Coffee Creamer, and "Sugar-free" Products. This will help patient to have stable blood glucose profile and potentially avoid unintended weight gain.   - I encouraged the patient to switch to  unprocessed or minimally processed complex starch and increased protein intake (animal or plant source), fruits, and vegetables.   - Patient is advised to stick to a routine mealtimes to eat 3 meals  a day and avoid unnecessary snacks ( to snack only to correct  hypoglycemia).  - I have approached patient with the following individualized plan to manage diabetes and patient agrees:   -She will continue to need intensive treatment with higher dose of basal/bolus insulin in order for her to achieve and maintain control of diabetes to target.   -She is advised to continue Levemir at 80 units nightly and continue NovoLog to 20-26 units 3 times a day before meals if glucose is above 70 and she is eating.  Specific instructions on how to titrate insulin dose based on glucose readings given to patient in writing.    -She is encouraged start consistently monitoring blood glucose 4 times daily, before meals and at bedtime and report to the clinic if blood glucose levels are less than 70 or greater than 200 for 3 tests in a row.  She would greatly benefit from CGM device.  I prescribed Dexcom device for her.  - She is not a candidate for metformin, SGLT2 inhibitors, nor incretin therapy.  - Patient specific target  A1c;  LDL,  HDL, Triglycerides, and  Waist Circumference were discussed in detail.  2) BP/HTN:  Her blood pressure is not controlled to target.  She reports she has been taking her BP meds as prescribed, but had not long taken her medication prior to todays visit.  She is advised to continue Lisinopril-HCT 10-12.5 mg po daily.  3) Lipids/HPL: Her recent lipid panel from 09/20/19 shows uncontrolled LDL at 134 and triglycerides of 208.  She reports since last visit, she has been taking her Lipitor more regularly.  She is advised to continue Lipitor 10 mg po daily at bedtime.   4)  Weight/Diet: Her Body mass index is 32.22 kg/m.   Further weight gain not recommended for her.  2 years ago, she did have significant weight deficit.  CDE Consult in progress , exercise, and detailed carbohydrates information provided.  Side effects and precautions discussed with her.  5) Vitamin D deficiency.   Her recent vitamin D level was 36 on 09/20/19.  She is on ongoing  supplement with cholecalciferol 2000 units daily and is advised to continue this.  6) Chronic Care/Health Maintenance: -Patient is on ACEI/ARB medications and Statin medication and encouraged to continue to follow up with Ophthalmology, Podiatrist at least yearly or according to recommendations, and advised to   stay away from smoking. I have recommended yearly flu vaccine and pneumonia vaccination at least every 5 years; moderate intensity exercise for up to 150 minutes weekly; and  sleep for at least 7 hours a day.  Given steady decline in her kidney function, she will be referred to nephrology for further workup.  - I advised patient to maintain close follow up with House, Deliah Goody, FNP for primary care needs.  - Time spent on this patient care encounter:  30 min, of which > 50% was spent in  counseling and the rest reviewing her blood glucose logs , discussing her hypoglycemia and hyperglycemia episodes, reviewing her current and  previous labs / studies  ( including abstraction from other facilities) and medications  doses and developing a  long term treatment plan and documenting her care.   Please refer to Patient Instructions for Blood Glucose Monitoring and Insulin/Medications Dosing Guide"  in media tab for additional information. Please  also refer to " Patient Self Inventory" in the Media  tab for reviewed elements of pertinent patient history.  Katherine Patton participated in the discussions, expressed understanding, and voiced agreement with the above plans.  All questions were answered to her satisfaction. she is encouraged to contact clinic should she have any questions or concerns prior to her return visit.  Follow up plan: - Return in about 3 months (around 03/26/2020) for Diabetes follow up with A1c in office, No previsit labs, Bring glucometer and logs.  Rayetta Pigg, [facility] [address] [place], [place] [address] Phone:  [phone] Fax: [phone]   12/27/2019, 10:06 AM

## 2019-12-27 NOTE — Patient Instructions (Signed)

## 2020-01-17 ENCOUNTER — Other Ambulatory Visit: Payer: Self-pay | Admitting: "Endocrinology

## 2020-01-17 DIAGNOSIS — IMO0002 Reserved for concepts with insufficient information to code with codable children: Secondary | ICD-10-CM

## 2020-01-17 DIAGNOSIS — E1065 Type 1 diabetes mellitus with hyperglycemia: Secondary | ICD-10-CM

## 2020-03-27 ENCOUNTER — Ambulatory Visit (INDEPENDENT_AMBULATORY_CARE_PROVIDER_SITE_OTHER): Payer: Medicaid Other | Admitting: Nurse Practitioner

## 2020-03-27 ENCOUNTER — Other Ambulatory Visit: Payer: Self-pay

## 2020-03-27 ENCOUNTER — Encounter: Payer: Self-pay | Admitting: Nurse Practitioner

## 2020-03-27 VITALS — BP 161/98 | HR 112 | Ht 60.0 in | Wt 169.6 lb

## 2020-03-27 DIAGNOSIS — E108 Type 1 diabetes mellitus with unspecified complications: Secondary | ICD-10-CM | POA: Diagnosis not present

## 2020-03-27 DIAGNOSIS — E782 Mixed hyperlipidemia: Secondary | ICD-10-CM

## 2020-03-27 DIAGNOSIS — I10 Essential (primary) hypertension: Secondary | ICD-10-CM | POA: Diagnosis not present

## 2020-03-27 DIAGNOSIS — IMO0002 Reserved for concepts with insufficient information to code with codable children: Secondary | ICD-10-CM

## 2020-03-27 DIAGNOSIS — E559 Vitamin D deficiency, unspecified: Secondary | ICD-10-CM | POA: Diagnosis not present

## 2020-03-27 DIAGNOSIS — E1065 Type 1 diabetes mellitus with hyperglycemia: Secondary | ICD-10-CM | POA: Diagnosis not present

## 2020-03-27 LAB — POCT GLYCOSYLATED HEMOGLOBIN (HGB A1C): HbA1c, POC (controlled diabetic range): 10 % — AB (ref 0.0–7.0)

## 2020-03-27 MED ORDER — RELION PEN NEEDLES 32G X 4 MM MISC: 3 refills | Status: DC

## 2020-03-27 NOTE — Progress Notes (Signed)
03/27/2020  Endocrinology follow-up note    Subjective:    Patient ID: Katherine Patton, female    DOB: Oct 01, 1960. Patient is being seen  in follow-up for management of chronically uncontrolled diabetes . PCP: Bradd Burner, FNP.  Past Medical History:  Diagnosis Date  . Diabetes mellitus without complication ([place])   . Uterine mass    Past Surgical History:  Procedure Laterality Date  . CATARACT EXTRACTION     Social History   Socioeconomic History  . Marital status: Single    Spouse name: Not on file  . Number of children: Not on file  . Years of education: Not on file  . Highest education level: Not on file  Occupational History  . Not on file  Tobacco Use  . Smoking status: Never Smoker  . Smokeless tobacco: Never Used  Vaping Use  . Vaping Use: Never used  Substance and Sexual Activity  . Alcohol use: No  . Drug use: No  . Sexual activity: Not Currently    Birth control/protection: None  Other Topics Concern  . Not on file  Social History Narrative  . Not on file   Social Determinants of Health   Financial Resource Strain: Not on file  Food Insecurity: Not on file  Transportation Needs: Not on file  Physical Activity: Not on file  Stress: Not on file  Social Connections: Not on file   Outpatient Encounter Medications as of 03/27/2020  Medication Sig  . acetaminophen (TYLENOL) 500 MG tablet Take 500 mg by mouth every 6 (six) hours as needed for headache.  Marland Kitchen atorvastatin (LIPITOR) 10 MG tablet Take 1 tablet (10 mg total) by mouth daily.  . Cholecalciferol 2000 units CAPS Take 1 capsule (2,000 Units total) by mouth daily with breakfast.  . glucose blood (ACCU-CHEK GUIDE) test strip USE 1 STRIP TO CHECK GLUCOSE 4 TIMES DAILY  . LEVEMIR FLEXTOUCH 100 UNIT/ML FlexPen INJECT 80 UNITS SUBCUTANEOUSLY AT BEDTIME  . lisinopril-hydrochlorothiazide (ZESTORETIC) 10-12.5 MG tablet Take 1 tablet by mouth daily.  Marland Kitchen NOVOLOG FLEXPEN 100 UNIT/ML FlexPen INJECT 20 TO 26 UNITS  SUBCUTANEOUSLY THREE TIMES DAILY WITH MEALS  . [DISCONTINUED] Insulin Pen Needle (RELION PEN NEEDLES) 32G X 4 MM MISC by Does not apply route. Use three times daily with injections  . Insulin Pen Needle (RELION PEN NEEDLES) 32G X 4 MM MISC Use three times daily with injections  . [DISCONTINUED] Continuous Blood Gluc Sensor (DEXCOM G6 SENSOR) MISC Apply new sensor every 10 days for glucose monitoring  . [DISCONTINUED] Continuous Blood Gluc Transmit (DEXCOM G6 TRANSMITTER) MISC 1 Piece by Does not apply route as directed.  . [DISCONTINUED] gabapentin (NEURONTIN) 300 MG capsule Take 300 mg by mouth 2 (two) times daily.  . [DISCONTINUED] RELION PEN NEEDLE 31G/8MM 31G X 8 MM MISC USE 1 PEN NEEDLE 4 TIMES DAILY   No facility-administered encounter medications on file as of 03/27/2020.   ALLERGIES: No Known Allergies VACCINATION STATUS:  There is no immunization history on file for this patient.  Diabetes She presents for her follow-up diabetic visit. She has type 1 (She is administratively classified as type 1 diabetes for management purposes.) diabetes mellitus. Onset time: She was diagnosed at approximate age of 34 years. Her disease course has been improving. Hypoglycemia symptoms include nervousness/anxiousness, sweats and tremors. Pertinent negatives for hypoglycemia include no confusion, headaches, pallor or seizures. Associated symptoms include fatigue, polydipsia and polyuria. Pertinent negatives for diabetes include no blurred vision, no chest pain, no foot paresthesias, no polyphagia  and no weight loss. There are no hypoglycemic complications. Symptoms are stable. Diabetic complications include nephropathy and peripheral neuropathy. Risk factors for coronary artery disease include diabetes mellitus, sedentary lifestyle, hypertension, dyslipidemia and stress. Current diabetic treatment includes intensive insulin program. She is compliant with treatment most of the time. Her weight is fluctuating  minimally. She is following a generally unhealthy diet. When asked about meal planning, she reported none. She has had a previous visit with a dietitian. She never participates in exercise. Her home blood glucose trend is fluctuating dramatically. Her breakfast blood glucose range is generally >200 mg/dl. Her lunch blood glucose range is generally >200 mg/dl. Her bedtime blood glucose range is generally 70-90 mg/dl. Her overall blood glucose range is >200 mg/dl. (She presents today with her meter and logs showing continued fluctuating glycemic profile.  She has fasting hyperglycemia mostly and then has hypoglycemia at dinnertime (likely due to timing of meals).  Her POCT A1c today is 10%, improving from previous visit of 10.7%.  She still admits to drinking large quantities of soda.  ) An ACE inhibitor/angiotensin II receptor blocker is being taken. She does not see a podiatrist.Eye exam is current.  Hypertension This is a chronic problem. The current episode started more than 1 year ago. The problem is unchanged. The problem is uncontrolled. Associated symptoms include sweats. Pertinent negatives include no blurred vision, chest pain, headaches, palpitations or shortness of breath. There are no associated agents to hypertension. Risk factors for coronary artery disease include diabetes mellitus, dyslipidemia, sedentary lifestyle and post-menopausal state. Past treatments include ACE inhibitors and diuretics. The current treatment provides mild improvement. Compliance problems include diet, exercise and psychosocial issues.  Hypertensive end-organ damage includes kidney disease. Identifiable causes of hypertension include chronic renal disease.    Review of systems  Constitutional: + Minimally fluctuating body weight,  current Body mass index is 33.12 kg/m. , no fatigue, no subjective hyperthermia, no subjective hypothermia Eyes: no blurry vision, no xerophthalmia ENT: no sore throat, no nodules palpated  in throat, no dysphagia/odynophagia, no hoarseness Cardiovascular: no chest pain, no shortness of breath, no palpitations, no leg swelling Respiratory: no cough, no shortness of breath Gastrointestinal: no nausea/vomiting/diarrhea Musculoskeletal: no muscle/joint aches, walks with cane Skin: no rashes, no hyperemia Neurological: no tremors, no dizziness, intermittent numbness/tingling to bilateral feet Psychiatric: no depression, no anxiety  Objective:    BP (!) 161/98 (BP Location: Right Arm, Patient Position: Sitting)   Pulse (!) 112   Ht 5' (1.524 m)   Wt 169 lb 9.6 oz (76.9 kg)   BMI 33.12 kg/m   Wt Readings from Last 3 Encounters:  03/27/20 169 lb 9.6 oz (76.9 kg)  12/27/19 165 lb (74.8 kg)  09/27/19 165 lb 12.8 oz (75.2 kg)    BP Readings from Last 3 Encounters:  03/27/20 (!) 161/98  12/27/19 (!) 178/97  09/27/19 (!) 144/93     Physical Exam- Limited  Constitutional:  Body mass index is 33.12 kg/m. , not in acute distress, normal state of mind Eyes:  EOMI, no exophthalmos Neck: Supple Cardiovascular: RRR, no murmers, rubs, or gallops, no edema Respiratory: Adequate breathing efforts, no crackles, rales, rhonchi, or wheezing Musculoskeletal: no gross deformities, strength intact in all four extremities, no gross restriction of joint movements, walks with cane  Skin:  no rashes, no hyperemia Neurological: no tremor with outstretched hands   Recent Results (from the past 2160 hour(s))  HgB A1c     Status: Abnormal   Collection Time: 03/27/20  9:47 AM  Result Value Ref Range   Hemoglobin A1C     HbA1c POC (<> result, manual entry)     HbA1c, POC (prediabetic range)     HbA1c, POC (controlled diabetic range) 10.0 (A) 0.0 - 7.0 %    Assessment & Plan:    1) Uncontrolled type 1 diabetes mellitus-chronically uncontrolled  - Patient has currently uncontrolled symptomatic type 1 DM (administrative reclassified as type I for management purposes) since  60 years of  age.  She presents today with her meter and logs showing continued fluctuating glycemic profile.  She has fasting hyperglycemia mostly and then has hypoglycemia at dinnertime (likely due to timing of meals).  Her POCT A1c today is 10%, improving from previous visit of 10.7%.  She still admits to drinking large quantities of soda.     - Her diabetes is complicated by neuropathy, patient remains at a high risk for more acute and chronic complications of diabetes which include CAD, CVA, CKD, retinopathy, and neuropathy. These are all discussed in detail with the patient.  - Nutritional counseling repeated at each appointment due to patients tendency to fall back in to old habits.  - The patient admits there is a room for improvement in their diet and drink choices. -  Suggestion is made for the patient to avoid simple carbohydrates from their diet including Cakes, Sweet Desserts / Pastries, Ice Cream, Soda (diet and regular), Sweet Tea, Candies, Chips, Cookies, Sweet Pastries,  Store Bought Juices, Alcohol in Excess of  1-2 drinks a day, Artificial Sweeteners, Coffee Creamer, and "Sugar-free" Products. This will help patient to have stable blood glucose profile and potentially avoid unintended weight gain.   - I encouraged the patient to switch to  unprocessed or minimally processed complex starch and increased protein intake (animal or plant source), fruits, and vegetables.   - Patient is advised to stick to a routine mealtimes to eat 3 meals  a day and avoid unnecessary snacks ( to snack only to correct hypoglycemia).  - I have approached patient with the following individualized plan to manage diabetes and patient agrees:   -She will continue to need intensive treatment with higher dose of basal/bolus insulin in order for her to achieve and maintain control of diabetes to target.   -For safety purposes, she is advised to continue Levemir at 80 units nightly and continue NovoLog to 20-26 units 3  times a day before meals if glucose is above 70 and she is eating.  Specific instructions on how to titrate insulin dose based on glucose readings given to patient in writing.  She will not tolerate higher doses of insulin at this time due to frequent postprandial hypoglycemia in the afternoons.  -Given the large quantity of insulin required to control her diabetes, she may benefit from switching to Humulin R U-500 for better insulin absorption.  We discussed this option today but decided to hold off given her large quantity of her current medications at home.   -She is encouraged start consistently monitoring blood glucose 4 times daily, before meals and at bedtime and report to the clinic if blood glucose levels are less than 70 or greater than 300 for 3 tests in a row.   - She is not a candidate for metformin, SGLT2 inhibitors, nor incretin therapy.  - Patient specific target  A1c;  LDL, HDL, Triglycerides, and  Waist Circumference were discussed in detail.  2) BP/HTN:  Her blood pressure is not controlled to target but  has improved since last visit.  She reports she has been taking her BP meds as prescribed, but had not long taken her medication prior to todays visit.  She is advised to continue Lisinopril-HCT 10-12.5 mg po daily.  She had a referral to nephrology in place, but never followed up with them.  She is advised to reach back out to them to establish care.  3) Lipids/HPL: Her recent lipid panel from 09/20/19 shows uncontrolled LDL at 134 and triglycerides of 208.  She reports since last visit, she has been taking her Lipitor more regularly.  She is advised to continue Lipitor 10 mg po daily at bedtime.   4)  Weight/Diet: Her Body mass index is 33.12 kg/m.   Further weight gain not recommended for her.  2 years ago, she did have significant weight deficit.  CDE Consult in progress , exercise, and detailed carbohydrates information provided.  Side effects and precautions discussed with  her.  5) Vitamin D deficiency.   Her recent vitamin D level was 36 on 09/20/19.  She is on ongoing supplement with cholecalciferol 2000 units daily and is advised to continue this.  6) Chronic Care/Health Maintenance: -Patient is on ACEI/ARB medications and Statin medication and encouraged to continue to follow up with Ophthalmology, Podiatrist at least yearly or according to recommendations, and advised to   stay away from smoking. I have recommended yearly flu vaccine and pneumonia vaccination at least every 5 years; moderate intensity exercise for up to 150 minutes weekly; and  sleep for at least 7 hours a day.    - I advised patient to maintain close follow up with House, Deliah Goody, FNP for primary care needs.  - Time spent on this patient care encounter:  40 min, of which > 50% was spent in  counseling and the rest reviewing her blood glucose logs , discussing her hypoglycemia and hyperglycemia episodes, reviewing her current and  previous labs / studies  ( including abstraction from other facilities) and medications  doses and developing a  long term treatment plan and documenting her care.   Please refer to Patient Instructions for Blood Glucose Monitoring and Insulin/Medications Dosing Guide"  in media tab for additional information. Please  also refer to " Patient Self Inventory" in the Media  tab for reviewed elements of pertinent patient history.  [name] participated in the discussions, expressed understanding, and voiced agreement with the above plans.  All questions were answered to her satisfaction. she is encouraged to contact clinic should she have any questions or concerns prior to her return visit.  Follow up plan: - Return in about 3 months (around 06/27/2020) for Diabetes follow up with A1c in office, Previsit labs, Bring glucometer and logs.  Rayetta Pigg, [facility] [address] [place], [place] [address] Phone:  [phone] Fax: [phone]   03/27/2020, 10:21 AM

## 2020-03-27 NOTE — Patient Instructions (Signed)
Advice for Weight Management  -For most of us the best way to lose weight is by diet management. Generally speaking, diet management means consuming less calories intentionally which over time brings about progressive weight loss.  This can be achieved more effectively by restricting carbohydrate consumption to the minimum possible.  So, it is critically important to know your numbers: how much calorie you are consuming and how much calorie you need. More importantly, our carbohydrates sources should be unprocessed or minimally processed complex starch food items.   Sometimes, it is important to balance nutrition by increasing protein intake (animal or plant source), fruits, and vegetables.  -Sticking to a routine mealtime to eat 3 meals a day and avoiding unnecessary snacks is shown to have a big role in weight control. Under normal circumstances, the only time we lose real weight is when we are hungry, so allow hunger to take place- hunger means no food between meal times, only water.  It is not advisable to starve.   -It is better to avoid simple carbohydrates including: Cakes, Sweet Desserts, Ice Cream, Soda (diet and regular), Sweet Tea, Candies, Chips, Cookies, Store Bought Juices, Alcohol in Excess of  1-2 drinks a day, Artificial Sweeteners, Doughnuts, Coffee Creamers, "Sugar-free" Products, etc, etc.  This is not a complete list.....    -Consulting with certified diabetes educators is proven to provide you with the most accurate and current information on diet.  Also, you may be  interested in discussing diet options/exchanges , we can schedule a visit with Katherine Patton, RDN, CDE for individualized nutrition education.  -Exercise: If you are able: 30 -60 minutes a day ,4 days a week, or 150 minutes a week.  The longer the better.  Combine stretch, strength, and aerobic activities.  If you were told in the past that you have high risk for cardiovascular diseases, you may seek evaluation by  your heart doctor prior to initiating moderate to intense exercise programs.    

## 2020-06-21 LAB — COMPREHENSIVE METABOLIC PANEL
ALT: 9 IU/L (ref 0–32)
AST: 20 IU/L (ref 0–40)
Albumin/Globulin Ratio: 1.5 (ref 1.2–2.2)
Albumin: 4.5 g/dL (ref 3.8–4.9)
Alkaline Phosphatase: 151 IU/L — ABNORMAL HIGH (ref 44–121)
BUN/Creatinine Ratio: 18 (ref 9–23)
BUN: 34 mg/dL — ABNORMAL HIGH (ref 6–24)
Bilirubin Total: 0.2 mg/dL (ref 0.0–1.2)
CO2: 25 mmol/L (ref 20–29)
Calcium: 9.8 mg/dL (ref 8.7–10.2)
Chloride: 101 mmol/L (ref 96–106)
Creatinine, Ser: 1.86 mg/dL — ABNORMAL HIGH (ref 0.57–1.00)
Globulin, Total: 3.1 g/dL (ref 1.5–4.5)
Glucose: 81 mg/dL (ref 65–99)
Potassium: 4 mmol/L (ref 3.5–5.2)
Sodium: 142 mmol/L (ref 134–144)
Total Protein: 7.6 g/dL (ref 6.0–8.5)
eGFR: 31 mL/min/{1.73_m2} — ABNORMAL LOW (ref >59)

## 2020-06-27 ENCOUNTER — Telehealth: Payer: Self-pay | Admitting: Nurse Practitioner

## 2020-06-27 ENCOUNTER — Ambulatory Visit: Payer: Medicaid Other | Admitting: Nurse Practitioner

## 2020-06-27 DIAGNOSIS — IMO0002 Reserved for concepts with insufficient information to code with codable children: Secondary | ICD-10-CM

## 2020-06-27 MED ORDER — LEVEMIR FLEXTOUCH 100 UNIT/ML ~~LOC~~ SOPN: PEN_INJECTOR | SUBCUTANEOUS | 0 refills | Status: DC

## 2020-06-27 MED ORDER — NOVOLOG FLEXPEN 100 UNIT/ML ~~LOC~~ SOPN: PEN_INJECTOR | SUBCUTANEOUS | 0 refills | Status: DC

## 2020-06-27 MED ORDER — ACCU-CHEK GUIDE VI STRP: ORAL_STRIP | 0 refills | Status: DC

## 2020-06-27 NOTE — Telephone Encounter (Signed)
Pt is requesting a refill on the following medications.  Accucheck guide test strips  NOVOLOG FLEXPEN 100 UNIT/ML FlexPen    LEVEMIR FLEXTOUCH 100 UNIT/ML [place], [place] [address] [place] Phone:  [phone]  Fax:  [phone]

## 2020-06-27 NOTE — Telephone Encounter (Signed)
Rxs sent

## 2020-07-31 ENCOUNTER — Ambulatory Visit (INDEPENDENT_AMBULATORY_CARE_PROVIDER_SITE_OTHER): Payer: Medicaid Other | Admitting: Nurse Practitioner

## 2020-07-31 ENCOUNTER — Encounter: Payer: Self-pay | Admitting: Nurse Practitioner

## 2020-07-31 ENCOUNTER — Other Ambulatory Visit: Payer: Self-pay

## 2020-07-31 VITALS — BP 156/88 | HR 102 | Ht 60.0 in | Wt 161.6 lb

## 2020-07-31 DIAGNOSIS — E1065 Type 1 diabetes mellitus with hyperglycemia: Secondary | ICD-10-CM | POA: Diagnosis not present

## 2020-07-31 DIAGNOSIS — IMO0002 Reserved for concepts with insufficient information to code with codable children: Secondary | ICD-10-CM

## 2020-07-31 DIAGNOSIS — E559 Vitamin D deficiency, unspecified: Secondary | ICD-10-CM

## 2020-07-31 DIAGNOSIS — E782 Mixed hyperlipidemia: Secondary | ICD-10-CM | POA: Diagnosis not present

## 2020-07-31 DIAGNOSIS — I10 Essential (primary) hypertension: Secondary | ICD-10-CM

## 2020-07-31 DIAGNOSIS — E108 Type 1 diabetes mellitus with unspecified complications: Secondary | ICD-10-CM

## 2020-07-31 LAB — POCT GLYCOSYLATED HEMOGLOBIN (HGB A1C): HbA1c, POC (controlled diabetic range): 11.4 % — AB (ref 0.0–7.0)

## 2020-07-31 MED ORDER — NOVOLOG FLEXPEN 100 UNIT/ML ~~LOC~~ SOPN: PEN_INJECTOR | SUBCUTANEOUS | 0 refills | Status: DC

## 2020-07-31 MED ORDER — ACCU-CHEK GUIDE VI STRP: ORAL_STRIP | 3 refills | Status: DC

## 2020-07-31 MED ORDER — LEVEMIR FLEXTOUCH 100 UNIT/ML ~~LOC~~ SOPN: PEN_INJECTOR | SUBCUTANEOUS | 0 refills | Status: DC

## 2020-07-31 NOTE — Patient Instructions (Signed)
Diabetes Mellitus and Nutrition, Adult When you have diabetes, or diabetes mellitus, it is very important to have healthy eating habits because your blood sugar (glucose) levels are greatly affected by what you eat and drink. Eating healthy foods in the right amounts, at about the same times every day, can help you:  Control your blood glucose.  Lower your risk of heart disease.  Improve your blood pressure.  Reach or maintain a healthy weight. What can affect my meal plan? Every person with diabetes is different, and each person has different needs for a meal plan. Your health care provider may recommend that you work with a dietitian to make a meal plan that is best for you. Your meal plan may vary depending on factors such as:  The calories you need.  The medicines you take.  Your weight.  Your blood glucose, blood pressure, and cholesterol levels.  Your activity level.  Other health conditions you have, such as heart or kidney disease. How do carbohydrates affect me? Carbohydrates, also called carbs, affect your blood glucose level more than any other type of food. Eating carbs naturally raises the amount of glucose in your blood. Carb counting is a method for keeping track of how many carbs you eat. Counting carbs is important to keep your blood glucose at a healthy level, especially if you use insulin or take certain oral diabetes medicines. It is important to know how many carbs you can safely have in each meal. This is different for every person. Your dietitian can help you calculate how many carbs you should have at each meal and for each snack. How does alcohol affect me? Alcohol can cause a sudden decrease in blood glucose (hypoglycemia), especially if you use insulin or take certain oral diabetes medicines. Hypoglycemia can be a life-threatening condition. Symptoms of hypoglycemia, such as sleepiness, dizziness, and confusion, are similar to symptoms of having too much  alcohol.  Do not drink alcohol if: ? Your health care provider tells you not to drink. ? You are pregnant, may be pregnant, or are planning to become pregnant.  If you drink alcohol: ? Do not drink on an empty stomach. ? Limit how much you use to:  0-1 drink a day for women.  0-2 drinks a day for men. ? Be aware of how much alcohol is in your drink. In the [place] one drink equals one 12 oz bottle of beer (355 mL), one 5 oz glass of wine (148 mL), or one 1 oz glass of hard liquor (44 mL). ? Keep yourself hydrated with water, diet soda, or unsweetened iced tea.  Keep in mind that regular soda, juice, and other mixers may contain a lot of sugar and must be counted as carbs. What are tips for following this plan? Reading food labels  Start by checking the serving size on the "Nutrition Facts" label of packaged foods and drinks. The amount of calories, carbs, fats, and other nutrients listed on the label is based on one serving of the item. Many items contain more than one serving per package.  Check the total grams (g) of carbs in one serving. You can calculate the number of servings of carbs in one serving by dividing the total carbs by 15. For example, if a food has 30 g of total carbs per serving, it would be equal to 2 servings of carbs.  Check the number of grams (g) of saturated fats and trans fats in one serving. Choose foods that have   a low amount or none of these fats.  Check the number of milligrams (mg) of salt (sodium) in one serving. Most people should limit total sodium intake to less than 2,300 mg per day.  Always check the nutrition information of foods labeled as "low-fat" or "nonfat." These foods may be higher in added sugar or refined carbs and should be avoided.  Talk to your dietitian to identify your daily goals for nutrients listed on the label. Shopping  Avoid buying canned, pre-made, or processed foods. These foods tend to be high in fat, sodium, and added  sugar.  Shop around the outside edge of the grocery store. This is where you will most often find fresh fruits and vegetables, bulk grains, fresh meats, and fresh dairy. Cooking  Use low-heat cooking methods, such as baking, instead of high-heat cooking methods like deep frying.  Cook using healthy oils, such as olive, canola, or sunflower oil.  Avoid cooking with butter, cream, or high-fat meats. Meal planning  Eat meals and snacks regularly, preferably at the same times every day. Avoid going long periods of time without eating.  Eat foods that are high in fiber, such as fresh fruits, vegetables, beans, and whole grains. Talk with your dietitian about how many servings of carbs you can eat at each meal.  Eat 4-6 oz (112-168 g) of lean protein each day, such as lean meat, chicken, fish, eggs, or tofu. One ounce (oz) of lean protein is equal to: ? 1 oz (28 g) of meat, chicken, or fish. ? 1 egg. ?  cup (62 g) of tofu.  Eat some foods each day that contain healthy fats, such as avocado, nuts, seeds, and fish.   What foods should I eat? Fruits Berries. Apples. Oranges. Peaches. Apricots. Plums. Grapes. Mango. Papaya. Pomegranate. Kiwi. Cherries. Vegetables Lettuce. Spinach. Leafy greens, including kale, chard, collard greens, and mustard greens. Beets. Cauliflower. Cabbage. Broccoli. Carrots. Green beans. Tomatoes. Peppers. Onions. Cucumbers. Brussels sprouts. Grains Whole grains, such as whole-wheat or whole-grain bread, crackers, tortillas, cereal, and pasta. Unsweetened oatmeal. Quinoa. Brown or wild rice. Meats and other proteins Seafood. Poultry without skin. Lean cuts of poultry and beef. Tofu. Nuts. Seeds. Dairy Low-fat or fat-free dairy products such as milk, yogurt, and cheese. The items listed above may not be a complete list of foods and beverages you can eat. Contact a dietitian for more information. What foods should I avoid? Fruits Fruits canned with  syrup. Vegetables Canned vegetables. Frozen vegetables with butter or cream sauce. Grains Refined white flour and flour products such as bread, pasta, snack foods, and cereals. Avoid all processed foods. Meats and other proteins Fatty cuts of meat. Poultry with skin. Breaded or fried meats. Processed meat. Avoid saturated fats. Dairy Full-fat yogurt, cheese, or milk. Beverages Sweetened drinks, such as soda or iced tea. The items listed above may not be a complete list of foods and beverages you should avoid. Contact a dietitian for more information. Questions to ask a health care provider  Do I need to meet with a diabetes educator?  Do I need to meet with a dietitian?  What number can I call if I have questions?  When are the best times to check my blood glucose? Where to find more information:  American Diabetes Association: diabetes.org  Academy of Nutrition and Dietetics: www.eatright.org  National Institute of Diabetes and Digestive and Kidney Diseases: www.niddk.nih.gov  Association of Diabetes Care and Education Specialists: www.diabeteseducator.org Summary  It is important to have healthy eating   habits because your blood sugar (glucose) levels are greatly affected by what you eat and drink.  A healthy meal plan will help you control your blood glucose and maintain a healthy lifestyle.  Your health care provider may recommend that you work with a dietitian to make a meal plan that is best for you.  Keep in mind that carbohydrates (carbs) and alcohol have immediate effects on your blood glucose levels. It is important to count carbs and to use alcohol carefully. This information is not intended to replace advice given to you by your health care provider. Make sure you discuss any questions you have with your health care provider. Document Revised: 12/06/2018 Document Reviewed: 12/06/2018 Elsevier Patient Education  2021 [employer]  

## 2020-07-31 NOTE — Progress Notes (Signed)
07/31/2020  Endocrinology follow-up note    Subjective:    Patient ID: Katherine Patton, female    DOB: October 25, 1960. Patient is being seen  in follow-up for management of chronically uncontrolled diabetes . PCP: Bradd Burner, FNP.  Past Medical History:  Diagnosis Date   Diabetes mellitus without complication ([place])    Uterine mass    Past Surgical History:  Procedure Laterality Date   CATARACT EXTRACTION     Social History   Socioeconomic History   Marital status: Single    Spouse name: Not on file   Number of children: Not on file   Years of education: Not on file   Highest education level: Not on file  Occupational History   Not on file  Tobacco Use   Smoking status: Never   Smokeless tobacco: Never  Vaping Use   Vaping Use: Never used  Substance and Sexual Activity   Alcohol use: No   Drug use: No   Sexual activity: Not Currently    Birth control/protection: None  Other Topics Concern   Not on file  Social History Narrative   Not on file   Social Determinants of Health   Financial Resource Strain: Not on file  Food Insecurity: Not on file  Transportation Needs: Not on file  Physical Activity: Not on file  Stress: Not on file  Social Connections: Not on file   Outpatient Encounter Medications as of 07/31/2020  Medication Sig   Multiple Vitamins-Minerals (MULTIVITAMIN ADULTS 50+) TABS Take 1 tablet by mouth daily in the afternoon.   acetaminophen (TYLENOL) 500 MG tablet Take 500 mg by mouth every 6 (six) hours as needed for headache.   atorvastatin (LIPITOR) 10 MG tablet Take 1 tablet (10 mg total) by mouth daily.   Cholecalciferol 2000 units CAPS Take 1 capsule (2,000 Units total) by mouth daily with breakfast.   glucose blood (ACCU-CHEK GUIDE) test strip USE 1 STRIP TO CHECK GLUCOSE 4 TIMES DAILY   insulin detemir (LEVEMIR FLEXTOUCH) 100 UNIT/ML FlexPen INJECT 90 UNITS SUBCUTANEOUSLY AT BEDTIME   Insulin Pen Needle (RELION PEN NEEDLES) 32G X 4 MM MISC Use  three times daily with injections   lisinopril-hydrochlorothiazide (ZESTORETIC) 10-12.5 MG tablet Take 1 tablet by mouth daily.   NOVOLOG FLEXPEN 100 UNIT/ML FlexPen INJECT 22 TO 28 UNITS SUBCUTANEOUSLY THREE TIMES DAILY WITH MEALS   [DISCONTINUED] glucose blood (ACCU-CHEK GUIDE) test strip USE 1 STRIP TO CHECK GLUCOSE 4 TIMES DAILY   [DISCONTINUED] insulin detemir (LEVEMIR FLEXTOUCH) 100 UNIT/ML FlexPen INJECT 80 UNITS SUBCUTANEOUSLY AT BEDTIME   [DISCONTINUED] NOVOLOG FLEXPEN 100 UNIT/ML FlexPen INJECT 20 TO 26 UNITS SUBCUTANEOUSLY THREE TIMES DAILY WITH MEALS   No facility-administered encounter medications on file as of 07/31/2020.   ALLERGIES: No Known Allergies VACCINATION STATUS:  There is no immunization history on file for this patient.  Diabetes She presents for her follow-up diabetic visit. She has type 1 (She is administratively classified as type 1 diabetes for management purposes.) diabetes mellitus. Onset time: She was diagnosed at approximate age of 25 years. Her disease course has been worsening. Hypoglycemia symptoms include nervousness/anxiousness, sweats and tremors. Pertinent negatives for hypoglycemia include no confusion, headaches, pallor or seizures. Associated symptoms include fatigue, polydipsia, polyuria and weight loss. Pertinent negatives for diabetes include no blurred vision, no chest pain, no foot paresthesias and no polyphagia. There are no hypoglycemic complications. Symptoms are stable. Diabetic complications include nephropathy and peripheral neuropathy. Risk factors for coronary artery disease include diabetes mellitus, sedentary lifestyle, hypertension, dyslipidemia  and stress. Current diabetic treatment includes intensive insulin program. She is compliant with treatment some of the time. Her weight is decreasing steadily. She is following a generally unhealthy diet. When asked about meal planning, she reported none. She has had a previous visit with a dietitian.  She never participates in exercise. Her home blood glucose trend is fluctuating dramatically. Her breakfast blood glucose range is generally >200 mg/dl. Her lunch blood glucose range is generally >200 mg/dl. Her dinner blood glucose range is generally >200 mg/dl. Her bedtime blood glucose range is generally >200 mg/dl. Her overall blood glucose range is >200 mg/dl. (She presents today with her meter and logs showing worsening glycemic profile overall.  Her POCT A1c today is 11.4%, increasing from last visit of 10%.  She admits she is eating and drinking poorly, still consuming large quantities of soda.  She does have intermittent episodes of hypoglycemia likely due to irregular eating pattern.) An ACE inhibitor/angiotensin II receptor blocker is being taken. She does not see a podiatrist.Eye exam is current.  Hypertension This is a chronic problem. The current episode started more than 1 year ago. The problem is unchanged. The problem is uncontrolled. Associated symptoms include sweats. Pertinent negatives include no blurred vision, chest pain, headaches, palpitations or shortness of breath. There are no associated agents to hypertension. Risk factors for coronary artery disease include diabetes mellitus, dyslipidemia, sedentary lifestyle and post-menopausal state. Past treatments include ACE inhibitors and diuretics. The current treatment provides mild improvement. Compliance problems include diet, exercise and psychosocial issues.  Hypertensive end-organ damage includes kidney disease. Identifiable causes of hypertension include chronic renal disease.   Review of systems  Constitutional: + Minimally fluctuating body weight,  current Body mass index is 31.56 kg/m. , no fatigue, no subjective hyperthermia, no subjective hypothermia Eyes: no blurry vision, no xerophthalmia ENT: no sore throat, no nodules palpated in throat, no dysphagia/odynophagia, no hoarseness Cardiovascular: no chest pain, no shortness  of breath, no palpitations, no leg swelling Respiratory: no cough, no shortness of breath Gastrointestinal: no nausea/vomiting/diarrhea Musculoskeletal: no muscle/joint aches, walks with cane Skin: no rashes, no hyperemia Neurological: no tremors, no dizziness, intermittent numbness/tingling to bilateral feet Psychiatric: no depression, no anxiety  Objective:    BP (!) 156/88   Pulse (!) 102   Ht 5' (1.524 m)   Wt 161 lb 9.6 oz (73.3 kg)   BMI 31.56 kg/m   Wt Readings from Last 3 Encounters:  07/31/20 161 lb 9.6 oz (73.3 kg)  03/27/20 169 lb 9.6 oz (76.9 kg)  12/27/19 165 lb (74.8 kg)    BP Readings from Last 3 Encounters:  07/31/20 (!) 156/88  03/27/20 (!) 161/98  12/27/19 (!) 178/97      Physical Exam- Limited  Constitutional:  Body mass index is 31.56 kg/m. , not in acute distress, normal state of mind Eyes:  EOMI, no exophthalmos Neck: Supple Cardiovascular: RRR, no murmurs, rubs, or gallops, no edema Respiratory: Adequate breathing efforts, no crackles, rales, rhonchi, or wheezing Musculoskeletal: no gross deformities, strength intact in all four extremities, no gross restriction of joint movements, walks with cane Skin:  no rashes, no hyperemia Neurological: no tremor with outstretched hands   Recent Results (from the past 2160 hour(s))  Comprehensive metabolic panel     Status: Abnormal   Collection Time: 06/20/20  8:39 AM  Result Value Ref Range   Glucose 81 65 - 99 mg/dL   BUN 34 (H) 6 - 24 mg/dL   Creatinine, Ser 1.86 (H) 0.57 -  1.00 mg/dL   eGFR 31 (L) >59 mL/min/1.73   BUN/Creatinine Ratio 18 9 - 23   Sodium 142 134 - 144 mmol/L   Potassium 4.0 3.5 - 5.2 mmol/L   Chloride 101 96 - 106 mmol/L   CO2 25 20 - 29 mmol/L   Calcium 9.8 8.7 - 10.2 mg/dL   Total Protein 7.6 6.0 - 8.5 g/dL   Albumin 4.5 3.8 - 4.9 g/dL   Globulin, Total 3.1 1.5 - 4.5 g/dL   Albumin/Globulin Ratio 1.5 1.2 - 2.2   Bilirubin Total 0.2 0.0 - 1.2 mg/dL   Alkaline Phosphatase  151 (H) 44 - 121 IU/L   AST 20 0 - 40 IU/L   ALT 9 0 - 32 IU/L  HgB A1c     Status: Abnormal   Collection Time: 07/31/20  8:56 AM  Result Value Ref Range   Hemoglobin A1C     HbA1c POC (<> result, manual entry)     HbA1c, POC (prediabetic range)     HbA1c, POC (controlled diabetic range) 11.4 (A) 0.0 - 7.0 %    Assessment & Plan:    1) Uncontrolled type 1 diabetes mellitus-chronically uncontrolled  - Patient has currently uncontrolled symptomatic type 1 DM (administrative reclassified as type I for management purposes) since  60 years of age.  She presents today with her meter and logs showing worsening glycemic profile overall.  Her POCT A1c today is 11.4%, increasing from last visit of 10%.  She admits she is eating and drinking poorly, still consuming large quantities of soda.  She does have intermittent episodes of hypoglycemia likely due to irregular eating pattern.   - Her diabetes is complicated by neuropathy, patient remains at a high risk for more acute and chronic complications of diabetes which include CAD, CVA, CKD, retinopathy, and neuropathy. These are all discussed in detail with the patient.  - Nutritional counseling repeated at each appointment due to patients tendency to fall back in to old habits.  - The patient admits there is a room for improvement in their diet and drink choices. -  Suggestion is made for the patient to avoid simple carbohydrates from their diet including Cakes, Sweet Desserts / Pastries, Ice Cream, Soda (diet and regular), Sweet Tea, Candies, Chips, Cookies, Sweet Pastries, Store Bought Juices, Alcohol in Excess of 1-2 drinks a day, Artificial Sweeteners, Coffee Creamer, and "Sugar-free" Products. This will help patient to have stable blood glucose profile and potentially avoid unintended weight gain.   - I encouraged the patient to switch to unprocessed or minimally processed complex starch and increased protein intake (animal or plant source),  fruits, and vegetables.   - Patient is advised to stick to a routine mealtimes to eat 3 meals a day and avoid unnecessary snacks (to snack only to correct hypoglycemia).  - I have approached patient with the following individualized plan to manage diabetes and patient agrees:   -She will continue to need intensive treatment with higher dose of basal/bolus insulin in order for her to achieve and maintain control of diabetes to target.   -She still has large quantity of her current insulin supplies, for this reason we will hold off on switching to U500 at this time.  She is advised to increase her Levemir to 90 units SQ nightly and increase her Novolog to 22-28 units TID with meals if glucose is above 90 and she is eating (specific instructions on how to titrate insulin dose based on glucose readings given to patient  in writing).  She is a   -She is encouraged start consistently monitoring blood glucose 4 times daily, before meals and at bedtime and report to the clinic if blood glucose levels are less than 70 or greater than 300 for 3 tests in a row.   - She is not a candidate for metformin, SGLT2 inhibitors, nor incretin therapy.  - Patient specific target  A1c;  LDL, HDL, Triglycerides, and  Waist Circumference were discussed in detail.  2) BP/HTN:  Her blood pressure is not controlled to target but continues to improve.  She reports she has been taking her BP meds as prescribed, but had not long taken her medication prior to todays visit.  She is advised to continue Lisinopril-HCT 10-12.5 mg po daily.  She had a referral to nephrology in place, but never followed up with them.  She is advised to reach back out to them to establish care.  3) Lipids/HPL: Her recent lipid panel from 09/20/19 shows uncontrolled LDL at 134 and triglycerides of 208.  She reports since last visit, she has been taking her Lipitor more regularly.  She is advised to continue Lipitor 10 mg po daily at bedtime.  Will recheck  lipid panel prior to next visit.   4)  Weight/Diet: Her Body mass index is 31.56 kg/m.   Further weight gain not recommended for her.  2 years ago, she did have significant weight deficit.  CDE Consult in progress , exercise, and detailed carbohydrates information provided.  Side effects and precautions discussed with her.  5) Vitamin D deficiency.   Her recent vitamin D level was 36 on 09/20/19.  She is on ongoing supplement with cholecalciferol 2000 units daily and is advised to continue this.  Will recheck vitamin D level prior to next visit.  6) Chronic Care/Health Maintenance: -Patient is on ACEI/ARB medications and Statin medication and encouraged to continue to follow up with Ophthalmology, Podiatrist at least yearly or according to recommendations, and advised to   stay away from smoking. I have recommended yearly flu vaccine and pneumonia vaccination at least every 5 years; moderate intensity exercise for up to 150 minutes weekly; and  sleep for at least 7 hours a day.    - I advised patient to maintain close follow up with House, Deliah Goody, FNP for primary care needs.    I spent 30 minutes in the care of the patient today including review of labs from [facility], Lipids, Thyroid Function, Hematology (current and previous including abstractions from other facilities); face-to-face time discussing  her blood glucose readings/logs, discussing hypoglycemia and hyperglycemia episodes and symptoms, medications doses, her options of short and long term treatment based on the latest standards of care / guidelines;  discussion about incorporating lifestyle medicine;  and documenting the encounter.    Please refer to Patient Instructions for Blood Glucose Monitoring and Insulin/Medications Dosing Guide"  in media tab for additional information. Please  also refer to " Patient Self Inventory" in the Media  tab for reviewed elements of pertinent patient history.  [name] participated in the  discussions, expressed understanding, and voiced agreement with the above plans.  All questions were answered to her satisfaction. she is encouraged to contact clinic should she have any questions or concerns prior to her return visit.  Follow up plan: - Return in about 3 months (around 10/31/2020) for Diabetes F/U- A1c and UM in office, Previsit labs, Bring meter and logs.  Rayetta Pigg, FNP-BC [facility] [address]  [address] [place], [place] [address] Phone: [phone] Fax: [phone]   07/31/2020, 9:25 AM

## 2020-11-01 ENCOUNTER — Ambulatory Visit: Payer: Medicaid Other | Admitting: Nurse Practitioner

## 2020-12-12 LAB — LIPID PANEL
Chol/HDL Ratio: 5.6 ratio — ABNORMAL HIGH (ref 0.0–4.4)
Cholesterol, Total: 234 mg/dL — ABNORMAL HIGH (ref 100–199)
HDL: 42 mg/dL (ref >39)
LDL Chol Calc (NIH): 135 mg/dL — ABNORMAL HIGH (ref 0–99)
Triglycerides: 314 mg/dL — ABNORMAL HIGH (ref 0–149)
VLDL Cholesterol Cal: 57 mg/dL — ABNORMAL HIGH (ref 5–40)

## 2020-12-12 LAB — COMPREHENSIVE METABOLIC PANEL
ALT: 9 IU/L (ref 0–32)
AST: 15 IU/L (ref 0–40)
Albumin/Globulin Ratio: 1.3 (ref 1.2–2.2)
Albumin: 4.1 g/dL (ref 3.8–4.9)
Alkaline Phosphatase: 127 IU/L — ABNORMAL HIGH (ref 44–121)
BUN/Creatinine Ratio: 17 (ref 9–23)
BUN: 28 mg/dL — ABNORMAL HIGH (ref 6–24)
Bilirubin Total: 0.2 mg/dL (ref 0.0–1.2)
CO2: 24 mmol/L (ref 20–29)
Calcium: 9.2 mg/dL (ref 8.7–10.2)
Chloride: 94 mmol/L — ABNORMAL LOW (ref 96–106)
Creatinine, Ser: 1.67 mg/dL — ABNORMAL HIGH (ref 0.57–1.00)
Globulin, Total: 3.2 g/dL (ref 1.5–4.5)
Glucose: 367 mg/dL — ABNORMAL HIGH (ref 70–99)
Potassium: 4.1 mmol/L (ref 3.5–5.2)
Sodium: 135 mmol/L (ref 134–144)
Total Protein: 7.3 g/dL (ref 6.0–8.5)
eGFR: 35 mL/min/{1.73_m2} — ABNORMAL LOW (ref >59)

## 2020-12-12 LAB — VITAMIN D 25 HYDROXY (VIT D DEFICIENCY, FRACTURES): Vit D, 25-Hydroxy: 14.5 ng/mL — ABNORMAL LOW (ref 30.0–100.0)

## 2020-12-16 ENCOUNTER — Telehealth: Payer: Self-pay

## 2020-12-16 ENCOUNTER — Encounter: Payer: Self-pay | Admitting: Nurse Practitioner

## 2020-12-16 ENCOUNTER — Other Ambulatory Visit: Payer: Self-pay

## 2020-12-16 ENCOUNTER — Encounter: Payer: Medicaid Other | Attending: Nurse Practitioner | Admitting: Nutrition

## 2020-12-16 ENCOUNTER — Ambulatory Visit (INDEPENDENT_AMBULATORY_CARE_PROVIDER_SITE_OTHER): Payer: Medicaid Other | Admitting: Nurse Practitioner

## 2020-12-16 VITALS — Ht 60.0 in | Wt 149.0 lb

## 2020-12-16 VITALS — BP 162/96 | HR 109 | Ht 60.0 in | Wt 149.0 lb

## 2020-12-16 DIAGNOSIS — E559 Vitamin D deficiency, unspecified: Secondary | ICD-10-CM

## 2020-12-16 DIAGNOSIS — E1065 Type 1 diabetes mellitus with hyperglycemia: Secondary | ICD-10-CM

## 2020-12-16 DIAGNOSIS — E782 Mixed hyperlipidemia: Secondary | ICD-10-CM | POA: Diagnosis present

## 2020-12-16 DIAGNOSIS — N183 Chronic kidney disease, stage 3 unspecified: Secondary | ICD-10-CM | POA: Diagnosis present

## 2020-12-16 DIAGNOSIS — Z91199 Patient's noncompliance with other medical treatment and regimen due to unspecified reason: Secondary | ICD-10-CM

## 2020-12-16 DIAGNOSIS — I10 Essential (primary) hypertension: Secondary | ICD-10-CM

## 2020-12-16 LAB — POCT GLYCOSYLATED HEMOGLOBIN (HGB A1C): HbA1c, POC (controlled diabetic range): 12.5 % — AB (ref 0.0–7.0)

## 2020-12-16 MED ORDER — DEXCOM G6 TRANSMITTER MISC: 3 refills | Status: DC

## 2020-12-16 MED ORDER — DEXCOM G6 RECEIVER DEVI: 0 refills | Status: DC

## 2020-12-16 MED ORDER — LEVEMIR FLEXTOUCH 100 UNIT/ML ~~LOC~~ SOPN: 40.0000 [IU] | PEN_INJECTOR | Freq: Every day | SUBCUTANEOUS | 3 refills | Status: DC

## 2020-12-16 MED ORDER — DEXCOM G6 SENSOR MISC: 3 refills | Status: DC

## 2020-12-16 MED ORDER — ATORVASTATIN CALCIUM 10 MG PO TABS
10.0000 mg | ORAL_TABLET | Freq: Every day | ORAL | 3 refills | Status: DC
Start: 2020-12-16 — End: 2022-01-14

## 2020-12-16 MED ORDER — VITAMIN D (ERGOCALCIFEROL) 1.25 MG (50000 UNIT) PO CAPS: 50000.0000 [IU] | ORAL_CAPSULE | ORAL | 0 refills | Status: DC

## 2020-12-16 NOTE — Progress Notes (Signed)
12/16/2020  Endocrinology follow-up note    Subjective:    Patient ID: Katherine Patton, female    DOB: Apr 25, 1960. Patient is being seen in follow-up for management of chronically uncontrolled diabetes . PCP: Bradd Burner, FNP.  Past Medical History:  Diagnosis Date   Diabetes mellitus without complication ([place])    Uterine mass    Past Surgical History:  Procedure Laterality Date   CATARACT EXTRACTION     Social History   Socioeconomic History   Marital status: Single    Spouse name: Not on file   Number of children: Not on file   Years of education: Not on file   Highest education level: Not on file  Occupational History   Not on file  Tobacco Use   Smoking status: Never   Smokeless tobacco: Never  Vaping Use   Vaping Use: Never used  Substance and Sexual Activity   Alcohol use: No   Drug use: No   Sexual activity: Not Currently    Birth control/protection: None  Other Topics Concern   Not on file  Social History Narrative   Not on file   Social Determinants of Health   Financial Resource Strain: Not on file  Food Insecurity: Not on file  Transportation Needs: Not on file  Physical Activity: Not on file  Stress: Not on file  Social Connections: Not on file   Outpatient Encounter Medications as of 12/16/2020  Medication Sig   acetaminophen (TYLENOL) 500 MG tablet Take 500 mg by mouth every 6 (six) hours as needed for headache.   Cholecalciferol 2000 units CAPS Take 1 capsule (2,000 Units total) by mouth daily with breakfast.   Continuous Blood Gluc Receiver (DEXCOM G6 RECEIVER) DEVI Use device to check glucose continuously.   Continuous Blood Gluc Sensor (DEXCOM G6 SENSOR) MISC Change sensor every 10 days as directed   Continuous Blood Gluc Transmit (DEXCOM G6 TRANSMITTER) MISC Change transmitter every 90 days as directed.   glucose blood (ACCU-CHEK GUIDE) test strip USE 1 STRIP TO CHECK GLUCOSE 4 TIMES DAILY   Insulin Pen Needle (RELION PEN NEEDLES) 32G X 4  MM MISC Use three times daily with injections   lisinopril-hydrochlorothiazide (ZESTORETIC) 10-12.5 MG tablet Take 1 tablet by mouth daily.   Multiple Vitamins-Minerals (MULTIVITAMIN ADULTS 50+) TABS Take 1 tablet by mouth daily in the afternoon.   NOVOLOG FLEXPEN 100 UNIT/ML FlexPen INJECT 22 TO 28 UNITS SUBCUTANEOUSLY THREE TIMES DAILY WITH MEALS   Vitamin D, Ergocalciferol, (DRISDOL) 1.25 MG (50000 UNIT) CAPS capsule Take 1 capsule (50,000 Units total) by mouth every 7 (seven) days.   [DISCONTINUED] atorvastatin (LIPITOR) 10 MG tablet Take 1 tablet (10 mg total) by mouth daily.   [DISCONTINUED] insulin detemir (LEVEMIR FLEXTOUCH) 100 UNIT/ML FlexPen INJECT 90 UNITS SUBCUTANEOUSLY AT BEDTIME   atorvastatin (LIPITOR) 10 MG tablet Take 1 tablet (10 mg total) by mouth daily.   insulin detemir (LEVEMIR FLEXTOUCH) 100 UNIT/ML FlexPen Inject 40 Units into the skin at bedtime. INJECT 90 UNITS SUBCUTANEOUSLY AT BEDTIME   No facility-administered encounter medications on file as of 12/16/2020.   ALLERGIES: No Known Allergies VACCINATION STATUS:  There is no immunization history on file for this patient.  Diabetes She presents for her follow-up diabetic visit. She has type 1 (She is administratively classified as type 1 diabetes for management purposes.) diabetes mellitus. Onset time: She was diagnosed at approximate age of 73 years. Her disease course has been worsening. Hypoglycemia symptoms include nervousness/anxiousness, sweats and tremors. Pertinent negatives for hypoglycemia  include no confusion, headaches, pallor or seizures. Associated symptoms include fatigue, polydipsia, polyuria and weight loss. Pertinent negatives for diabetes include no blurred vision, no chest pain, no foot paresthesias and no polyphagia. There are no hypoglycemic complications. Symptoms are stable. Diabetic complications include nephropathy and peripheral neuropathy. Risk factors for coronary artery disease include diabetes  mellitus, sedentary lifestyle, hypertension, dyslipidemia and stress. Current diabetic treatment includes intensive insulin program. She is compliant with treatment some of the time (frequently misses opportunities to inject bolus insulin). Her weight is decreasing rapidly. She is following a generally unhealthy diet. When asked about meal planning, she reported none. She has had a previous visit with a dietitian. She never participates in exercise. Her home blood glucose trend is fluctuating dramatically. (She presents today with her meter and logs showing inconsistent glucose monitoring pattern and dramatically fluctuating glycemic profile overall.  Her POCT A1c today is 12.5%, increasing from last visit of 11.4%.  She reports she has had a bad infection in between visits which she thinks has affected her glucose.  She has been missing opportunities to inject her Novolog insulin due to not checking her glucose.  She also admits she has not been taking the recommended 90 units of Levemir due to fear of hypoglycemia (although she has been writing it down on her logs that she has been taking 90 units).) An ACE inhibitor/angiotensin II receptor blocker is being taken. She does not see a podiatrist.Eye exam is current.  Hypertension This is a chronic problem. The current episode started more than 1 year ago. The problem is unchanged. The problem is uncontrolled. Associated symptoms include sweats. Pertinent negatives include no blurred vision, chest pain, headaches, palpitations or shortness of breath. There are no associated agents to hypertension. Risk factors for coronary artery disease include diabetes mellitus, dyslipidemia, sedentary lifestyle and post-menopausal state. Past treatments include ACE inhibitors and diuretics. The current treatment provides mild improvement. Compliance problems include diet, exercise and psychosocial issues.  Hypertensive end-organ damage includes kidney disease. Identifiable  causes of hypertension include chronic renal disease.   Review of systems  Constitutional: + drastically decreasing body weight,  current Body mass index is 31.56 kg/m. , no fatigue, no subjective hyperthermia, no subjective hypothermia Eyes: no blurry vision, no xerophthalmia ENT: no sore throat, no nodules palpated in throat, no dysphagia/odynophagia, no hoarseness Cardiovascular: no chest pain, no shortness of breath, no palpitations, no leg swelling Respiratory: no cough, no shortness of breath Gastrointestinal: no nausea/vomiting/diarrhea Musculoskeletal: no muscle/joint aches, walks with cane Skin: no rashes, no hyperemia Neurological: no tremors, no dizziness, intermittent numbness/tingling to bilateral feet Psychiatric: no depression, no anxiety  Objective:    BP (!) 162/96   Pulse (!) 109   Ht 5' (1.524 m)   Wt 149 lb (67.6 kg)   BMI 29.10 kg/m   Wt Readings from Last 3 Encounters:  12/16/20 149 lb (67.6 kg)  07/31/20 161 lb 9.6 oz (73.3 kg)  03/27/20 169 lb 9.6 oz (76.9 kg)    BP Readings from Last 3 Encounters:  12/16/20 (!) 162/96  07/31/20 (!) 156/88  03/27/20 (!) 161/98    Physical Exam- Limited  Constitutional:  Body mass index is 29.1 kg/m. , not in acute distress, normal state of mind Eyes:  EOMI, no exophthalmos Neck: Supple Cardiovascular: RRR, no murmurs, rubs, or gallops, no edema Respiratory: Adequate breathing efforts, no crackles, rales, rhonchi, or wheezing Musculoskeletal: no gross deformities, strength intact in all four extremities, no gross restriction of joint movements, walks  with cane Skin:  no rashes, no hyperemia Neurological: no tremor with outstretched hands   Recent Results (from the past 2160 hour(s))  Comprehensive metabolic panel     Status: Abnormal   Collection Time: 12/11/20  8:21 AM  Result Value Ref Range   Glucose 367 (H) 70 - 99 mg/dL   BUN 28 (H) 6 - 24 mg/dL   Creatinine, Ser 1.67 (H) 0.57 - 1.00 mg/dL   eGFR 35  (L) >59 mL/min/1.73   BUN/Creatinine Ratio 17 9 - 23   Sodium 135 134 - 144 mmol/L   Potassium 4.1 3.5 - 5.2 mmol/L   Chloride 94 (L) 96 - 106 mmol/L   CO2 24 20 - 29 mmol/L   Calcium 9.2 8.7 - 10.2 mg/dL   Total Protein 7.3 6.0 - 8.5 g/dL   Albumin 4.1 3.8 - 4.9 g/dL   Globulin, Total 3.2 1.5 - 4.5 g/dL   Albumin/Globulin Ratio 1.3 1.2 - 2.2   Bilirubin Total 0.2 0.0 - 1.2 mg/dL   Alkaline Phosphatase 127 (H) 44 - 121 IU/L   AST 15 0 - 40 IU/L   ALT 9 0 - 32 IU/L  Lipid panel     Status: Abnormal   Collection Time: 12/11/20  8:21 AM  Result Value Ref Range   Cholesterol, Total 234 (H) 100 - 199 mg/dL   Triglycerides 314 (H) 0 - 149 mg/dL   HDL 42 >39 mg/dL   VLDL Cholesterol Cal 57 (H) 5 - 40 mg/dL   LDL Chol Calc (NIH) 135 (H) 0 - 99 mg/dL   Chol/HDL Ratio 5.6 (H) 0.0 - 4.4 ratio    Comment:                                   T. Chol/HDL Ratio                                             Men  Women                               1/2 Avg.Risk  3.4    3.3                                   Avg.Risk  5.0    4.4                                2X Avg.Risk  9.6    7.1                                3X Avg.Risk 23.4   11.0   VITAMIN D 25 Hydroxy (Vit-D Deficiency, Fractures)     Status: Abnormal   Collection Time: 12/11/20  8:21 AM  Result Value Ref Range   Vit D, 25-Hydroxy 14.5 (L) 30.0 - 100.0 ng/mL    Comment: Vitamin D deficiency has been defined by the [place] practice guideline as a level of serum 25-OH vitamin D less than 20 ng/mL (1,2). The Endocrine Society went  on to further define vitamin D insufficiency as a level between 21 and 29 ng/mL (2). 1. IOM (Institute of Medicine). 2010. Dietary reference    intakes for calcium and D. [place]: The    [employer]. 2. Holick MF, Binkley [place], Bischoff-Ferrari HA, et al.    Evaluation, treatment, and prevention of vitamin D    deficiency: an Endocrine Society clinical  practice    guideline. JCEM. 2011 Jul; 96(7):1911-30.   POCT glycosylated hemoglobin (Hb A1C)     Status: Abnormal   Collection Time: 12/16/20  9:51 AM  Result Value Ref Range   Hemoglobin A1C     HbA1c POC (<> result, manual entry)     HbA1c, POC (prediabetic range)     HbA1c, POC (controlled diabetic range) 12.5 (A) 0.0 - 7.0 %    Assessment & Plan:    1) Uncontrolled type 1 diabetes mellitus-chronically uncontrolled  - Patient has currently uncontrolled symptomatic type 1 DM (administrative reclassified as type I for management purposes) since  60 years of age.  She presents today with her meter and logs showing inconsistent glucose monitoring pattern and dramatically fluctuating glycemic profile overall.  Her POCT A1c today is 12.5%, increasing from last visit of 11.4%.  She reports she has had a bad infection in between visits which she thinks has affected her glucose.  She has been missing opportunities to inject her Novolog insulin due to not checking her glucose.  She also admits she has not been taking the recommended 90 units of Levemir due to fear of hypoglycemia (although she has been writing it down on her logs that she has been taking 90 units).   - Her diabetes is complicated by neuropathy, patient remains at a high risk for more acute and chronic complications of diabetes which include CAD, CVA, CKD, retinopathy, and neuropathy. These are all discussed in detail with the patient.  - Nutritional counseling repeated at each appointment due to patients tendency to fall back in to old habits.  - The patient admits there is a room for improvement in their diet and drink choices. -  Suggestion is made for the patient to avoid simple carbohydrates from their diet including Cakes, Sweet Desserts / Pastries, Ice Cream, Soda (diet and regular), Sweet Tea, Candies, Chips, Cookies, Sweet Pastries, Store Bought Juices, Alcohol in Excess of 1-2 drinks a day, Artificial Sweeteners, Coffee  Creamer, and "Sugar-free" Products. This will help patient to have stable blood glucose profile and potentially avoid unintended weight gain.   - I encouraged the patient to switch to unprocessed or minimally processed complex starch and increased protein intake (animal or plant source), fruits, and vegetables.   - Patient is advised to stick to a routine mealtimes to eat 3 meals a day and avoid unnecessary snacks (to snack only to correct hypoglycemia).  -She has seen Jearld Fenton, RDE in the past for individualized education, will set her up for follow up today.  - I have approached patient with the following individualized plan to manage diabetes and patient agrees:   -She will continue to need intensive treatment with higher dose of basal/bolus insulin in order for her to achieve and maintain control of diabetes to target.   -She is advised to stick to a stable dose of Levemir 40 units SQ nightly and be more consistent with injecting her Novolog 22-28 units TID with meals if glucose is above 90 and she is eating (Specific instructions on how to titrate insulin dosage  based on glucose readings given to patient in writing).     -She is encouraged start consistently monitoring blood glucose 4 times daily, before meals and at bedtime and report to the clinic if blood glucose levels are less than 70 or greater than 300 for 3 tests in a row.  I did send in for Dexcom to her local pharmacy which will be of great assistance in her case.  I am asking that she return in 2 weeks to evaluate current response to treatment and to make further adjustments to her regimen.  - She is not a candidate for metformin, SGLT2 inhibitors, nor incretin therapy.  - Patient specific target  A1c;  LDL, HDL, Triglycerides, and  Waist Circumference were discussed in detail.  2) BP/HTN:  Her blood pressure is not controlled to target.  She reports she has been taking her BP meds as prescribed, but had not long taken her  medication prior to todays visit.  She is advised to continue Lisinopril-HCT 10-12.5 mg po daily.    3) Lipids/HPL: Her recent lipid panel from 12/11/20 shows uncontrolled LDL at 135 and triglycerides of 314 (worsening).  She has not been taking her Lipitor as prescribed.  I reinitiated Lipitor 10 mg po daily at bedtime.    4)  Weight/Diet: Her Body mass index is 29.1 kg/m.   Further weight gain not recommended for her.  2 years ago, she did have significant weight deficit.  CDE Consult in progress , exercise, and detailed carbohydrates information provided.  Side effects and precautions discussed with her.  5) Vitamin D deficiency.   Her recent vitamin D level was 14.5 on 12/11/20.  She is currently only taking MVI.  I discussed and initiated replenishment with Ergocalciferol 50000 units PO weekly x 12 weeks.  6) Chronic Care/Health Maintenance: -Patient is on ACEI/ARB medications and Statin medication and encouraged to continue to follow up with Ophthalmology, Podiatrist at least yearly or according to recommendations, and advised to   stay away from smoking. I have recommended yearly flu vaccine and pneumonia vaccination at least every 5 years; moderate intensity exercise for up to 150 minutes weekly; and  sleep for at least 7 hours a day.    - I advised patient to maintain close follow up with House, Deliah Goody, FNP for primary care needs.     I spent 38 minutes in the care of the patient today including review of labs from [facility], Lipids, Thyroid Function, Hematology (current and previous including abstractions from other facilities); face-to-face time discussing  her blood glucose readings/logs, discussing hypoglycemia and hyperglycemia episodes and symptoms, medications doses, her options of short and long term treatment based on the latest standards of care / guidelines;  discussion about incorporating lifestyle medicine;  and documenting the encounter.    Please refer to Patient  Instructions for Blood Glucose Monitoring and Insulin/Medications Dosing Guide"  in media tab for additional information. Please  also refer to " Patient Self Inventory" in the Media  tab for reviewed elements of pertinent patient history.  [name] participated in the discussions, expressed understanding, and voiced agreement with the above plans.  All questions were answered to her satisfaction. she is encouraged to contact clinic should she have any questions or concerns prior to her return visit.  Follow up plan: - Return in about 2 weeks (around 12/30/2020) for Diabetes F/U, Bring meter and logs, No previsit labs.  Rayetta Pigg, FNP-BC [facility] [address] [place], [place]  [place] Phone: [phone] Fax: [phone]   12/16/2020, 10:14 AM

## 2020-12-16 NOTE — Progress Notes (Signed)
Medical Nutrition Therapy  Appointment Start time:  6503  Appointment End time:  5465  Primary concerns today: DM  Type 1, Walkin  Referral diagnosis: E10.8 Preferred learning style: no preference  Learning readiness: contemplating (not ready, contemplating, ready, change in progress)   [place] in  [place], [place] with [facility]. Says she has a lot of stress and is worried about her daughter who has no where to live. Notes her stress doesn't allow her to eat on time or take her insulin since she doesn't eat. Admits to being depressed but doesn't have anyone to talk too. Willing to see about referral to [facility] to help with therapy.  Anthropometrics    Wt Readings from Last 3 Encounters:        12/16/20 149 lb (67.6 kg)   Lab Results  Component Value Date   HGBA1C 12.5 (A) 12/16/2020   CMP Latest Ref Rng & Units 12/11/2020 06/20/2020 09/20/2019  Glucose 70 - 99 mg/dL 367(H) 81 80  BUN 6 - 24 mg/dL 28(H) 34(H) 23  Creatinine 0.57 - 1.00 mg/dL 1.67(H) 1.86(H) 1.79(H)  Sodium 134 - 144 mmol/L 135 142 141  Potassium 3.5 - 5.2 mmol/L 4.1 4.0 4.2  Chloride 96 - 106 mmol/L 94(L) 101 101  CO2 20 - 29 mmol/L 24 25 24   Calcium 8.7 - 10.2 mg/dL 9.2 9.8 9.6  Total Protein 6.0 - 8.5 g/dL 7.3 7.6 7.6  Total Bilirubin 0.0 - 1.2 mg/dL 0.2 0.2 0.3  Alkaline Phos 44 - 121 IU/L 127(H) 151(H) 130(H)  AST 0 - 40 IU/L 15 20 23   ALT 0 - 32 IU/L 9 9 14     Body mass index is 29.1 kg/m. @BMIFA @ Facility age limit for growth percentiles is 20 years. Facility age limit for growth percentiles is 20 years.   Clinical Medical Hx: DM Type 2, Medications: see chart Labs:  Lab Results  Component Value Date   HGBA1C 12.5 (A) 12/16/2020    Notable Signs/Symptoms: increased thirsty, frequent urination, blurry vision, fatigue, depressed  Lifestyle & Dietary Hx LIves with her boyfried. Worried about her daughter and her living conditions.  Estimated  daily fluid intake: 16 oz Supplements:  Sleep: varies Stress / self-care: her family Current average weekly physical activity: ADL  24-Hr Dietary Recall Eats 1-2 times per day. Is stressed and doesn't eat and then doesn't take her insulin and feels bad, so then doesn't want to eat. Drinks sodas and eats sweets to deal with stress.  Estimated Energy Needs Calories: 1500 Carbohydrate: 170g Protein: 112g Fat: 42g   NUTRITION DIAGNOSIS  NB-1.1 Food and nutrition-related knowledge deficit As related to Diabetes Type 1.  As evidenced by A1C 12.4%.   NUTRITION INTERVENTION  Nutrition education (E-1) on the following topics:  Nutrition and Diabetes education provided on My Plate, CHO counting, meal planning, portion sizes, timing of meals, avoiding snacks between meals unless having a low blood sugar, target ranges for A1C and blood sugars, signs/symptoms and treatment of hyper/hypoglycemia, monitoring blood sugars, taking medications as prescribed, benefits of exercising 30 minutes per day and prevention of complications of DM.   Handouts Provided Include  Meal PLan Card My Plate  Learning Style & Readiness for Change Teaching method utilized: Visual & Auditory  Demonstrated degree of understanding via: Teach Back  Barriers to learning/adherence to lifestyle change: depression and stress  Goals Established by Pt Eat three meals per day Test blood sugars before meals and use sliding scale insulin  Drink  only water Cut out sodas and sweets Don't skip meals Call [facility] to schedule therapy session.   MONITORING & EVALUATION Dietary intake, weekly physical activity, and blood sugar in 2 weeks.  Next Steps  Patient is to work on eating 3 meals per day and taking insulin as prescribed.Marland Kitchen

## 2020-12-16 NOTE — Telephone Encounter (Signed)
Started PA for [employer] Receiver, Transport planner, and Sensors with [employer] at [phone].

## 2020-12-16 NOTE — Patient Instructions (Signed)

## 2020-12-17 ENCOUNTER — Other Ambulatory Visit: Payer: Self-pay | Admitting: Nurse Practitioner

## 2020-12-17 DIAGNOSIS — N183 Chronic kidney disease, stage 3 unspecified: Secondary | ICD-10-CM

## 2020-12-17 NOTE — Progress Notes (Signed)
Entered referral to Dietician to help with diabetes management.

## 2020-12-17 NOTE — Telephone Encounter (Signed)
Called [employer] and spoke to [place] and she stated that all has been approved for the [employer] Receiver, Education officer, community, and the [employer]. Reference ID# [id number]

## 2020-12-30 ENCOUNTER — Ambulatory Visit (INDEPENDENT_AMBULATORY_CARE_PROVIDER_SITE_OTHER): Payer: Medicaid Other | Admitting: Nurse Practitioner

## 2020-12-30 ENCOUNTER — Encounter: Payer: Medicaid Other | Attending: Nurse Practitioner | Admitting: Nutrition

## 2020-12-30 ENCOUNTER — Encounter: Payer: Self-pay | Admitting: Nutrition

## 2020-12-30 ENCOUNTER — Encounter: Payer: Self-pay | Admitting: Nurse Practitioner

## 2020-12-30 ENCOUNTER — Other Ambulatory Visit: Payer: Self-pay

## 2020-12-30 VITALS — BP 177/98 | HR 98 | Ht 60.0 in | Wt 155.4 lb

## 2020-12-30 VITALS — Ht 60.0 in | Wt 156.0 lb

## 2020-12-30 DIAGNOSIS — I10 Essential (primary) hypertension: Secondary | ICD-10-CM | POA: Diagnosis not present

## 2020-12-30 DIAGNOSIS — E559 Vitamin D deficiency, unspecified: Secondary | ICD-10-CM | POA: Insufficient documentation

## 2020-12-30 DIAGNOSIS — E782 Mixed hyperlipidemia: Secondary | ICD-10-CM | POA: Diagnosis not present

## 2020-12-30 DIAGNOSIS — E1065 Type 1 diabetes mellitus with hyperglycemia: Secondary | ICD-10-CM | POA: Diagnosis not present

## 2020-12-30 MED ORDER — NOVOLOG FLEXPEN 100 UNIT/ML ~~LOC~~ SOPN: PEN_INJECTOR | SUBCUTANEOUS | 0 refills | Status: DC

## 2020-12-30 NOTE — Patient Instructions (Signed)

## 2020-12-30 NOTE — Progress Notes (Signed)
Medical Nutrition Therapy DM Follow up Appointment Start time:  25  Appointment End time:  63  Primary concerns today: DM  Follow up   Referral diagnosis: E11.8 Preferred learning style: no preference  Learning readiness: Ready    NUTRITION ASSESSMENT  "I''m doing a lot better. Leaving those sweets alone and talking to a friend to help reduce my stress."   FBS:76-140's.  Had a few highs from toothache recently. Will schedule appt with dentist soon. Changes made:  Eating better.  Cut out soda's, cakes, candy. Eating more vegetables and protein. Drinking a little water.  Has been talking with a friend to help with her anxiety. Feels better.  Anthropometrics  Wt Readings from Last 3 Encounters:  12/30/20 155 lb 6.4 oz (70.5 kg)  12/30/20 156 lb (70.8 kg)  12/16/20 149 lb (67.6 kg)   Ht Readings from Last 3 Encounters:  12/30/20 5' (1.524 m)  12/30/20 5' (1.524 m)  12/16/20 5' (1.524 m)   Body mass index is 30.47 kg/m. @BMIFA @ Facility age limit for growth percentiles is 20 years. Facility age limit for growth percentiles is 20 years. CMP Latest Ref Rng & Units 12/11/2020 06/20/2020 09/20/2019  Glucose 70 - 99 mg/dL 367(H) 81 80  BUN 6 - 24 mg/dL 28(H) 34(H) 23  Creatinine 0.57 - 1.00 mg/dL 1.67(H) 1.86(H) 1.79(H)  Sodium 134 - 144 mmol/L 135 142 141  Potassium 3.5 - 5.2 mmol/L 4.1 4.0 4.2  Chloride 96 - 106 mmol/L 94(L) 101 101  CO2 20 - 29 mmol/L 24 25 24   Calcium 8.7 - 10.2 mg/dL 9.2 9.8 9.6  Total Protein 6.0 - 8.5 g/dL 7.3 7.6 7.6  Total Bilirubin 0.0 - 1.2 mg/dL 0.2 0.2 0.3  Alkaline Phos 44 - 121 IU/L 127(H) 151(H) 130(H)  AST 0 - 40 IU/L 15 20 23   ALT 0 - 32 IU/L 9 9 14    Lipid Panel     Component Value Date/Time   CHOL 234 (H) 12/11/2020 0821   TRIG 314 (H) 12/11/2020 0821   HDL 42 12/11/2020 0821   CHOLHDL 5.6 (H) 12/11/2020 0821   LDLCALC 135 (H) 12/11/2020 0821   LABVLDL 57 (H) 12/11/2020 7654     Clinical Medical Hx: DM, HTN and  Hyperlipidemia Medications: see chart: Levemir 40 units a day, and Novolog 18+ sliding scale TID with meals. Labs:  Lab Results  Component Value Date   HGBA1C 12.5 (A) 12/16/2020    Notable Signs/Symptoms: Feeling much beter  Lifestyle & Dietary Hx LIves with her boyfried  Estimated daily fluid intake: 32 oz Supplements:  Sleep: good Stress / self-care: better Current average weekly physical activity: walks some  24-Hr Dietary Recall First Meal: Salmon cake, water Snack:  Second Meal: 2 slices pizza,  water, juice Snack:  Third Meal: pb sandwich, water Snack:  Beverages: water, juice  Estimated Energy Needs Calories: 1500 Carbohydrate: 170g Protein: 112g Fat: 42g   NUTRITION DIAGNOSIS  NB-1.1 Food and nutrition-related knowledge deficit As related to Diabetes Type  1.  As evidenced by 12.5%.   NUTRITION INTERVENTION  Nutrition education (E-1) on the following topics:  Nutrition and Diabetes education provided on My Plate, CHO counting, meal planning, portion sizes, timing of meals, avoiding snacks between meals unless having a low blood sugar, target ranges for A1C and blood sugars, signs/symptoms and treatment of hyper/hypoglycemia, monitoring blood sugars, taking medications as prescribed, benefits of exercising 30 minutes per day and prevention of complications of DM. Nutrition Plant based lifestyle  Handouts  Provided Include  My Plate Meal Plan Card   Learning Style & Readiness for Change Teaching method utilized: Visual & Auditory  Demonstrated degree of understanding via: Teach Back  Barriers to learning/adherence to lifestyle change: None  Goals Established by Pt Goals  Drink more water 5 bottles per day Cut out juice and sodas Increase fresh fruits and vegetables Don't skip meals Keep up the great job!! Keep walking Keep walking Keep talking to your friend for emotional support or listen to radio/pray.    MONITORING & EVALUATION Dietary  intake, weekly physical activity, and blood sugars in 3 months.  Next Steps  Patient is to work on eating consistently and taking medications as prescribed. Contact a dentist.

## 2020-12-30 NOTE — Progress Notes (Signed)
12/30/2020  Endocrinology follow-up note    Subjective:    Patient ID: Katherine Patton, female    DOB: 09/26/1960. Patient is being seen in follow-up for management of chronically uncontrolled diabetes . PCP: Bradd Burner, FNP.  Past Medical History:  Diagnosis Date   Diabetes mellitus without complication ([facility])    Uterine mass    Past Surgical History:  Procedure Laterality Date   CATARACT EXTRACTION     Social History   Socioeconomic History   Marital status: Single    Spouse name: Not on file   Number of children: Not on file   Years of education: Not on file   Highest education level: Not on file  Occupational History   Not on file  Tobacco Use   Smoking status: Never   Smokeless tobacco: Never  Vaping Use   Vaping Use: Never used  Substance and Sexual Activity   Alcohol use: No   Drug use: No   Sexual activity: Not Currently    Birth control/protection: None  Other Topics Concern   Not on file  Social History Narrative   Not on file   Social Determinants of Health   Financial Resource Strain: Not on file  Food Insecurity: Not on file  Transportation Needs: Not on file  Physical Activity: Not on file  Stress: Not on file  Social Connections: Not on file   Outpatient Encounter Medications as of 12/30/2020  Medication Sig   acetaminophen (TYLENOL) 500 MG tablet Take 500 mg by mouth every 6 (six) hours as needed for headache.   atorvastatin (LIPITOR) 10 MG tablet Take 1 tablet (10 mg total) by mouth daily.   Cholecalciferol 2000 units CAPS Take 1 capsule (2,000 Units total) by mouth daily with breakfast.   glucose blood (ACCU-CHEK GUIDE) test strip USE 1 STRIP TO CHECK GLUCOSE 4 TIMES DAILY   insulin detemir (LEVEMIR FLEXTOUCH) 100 UNIT/ML FlexPen Inject 40 Units into the skin at bedtime. INJECT 90 UNITS SUBCUTANEOUSLY AT BEDTIME   Insulin Pen Needle (RELION PEN NEEDLES) 32G X 4 MM MISC Use three times daily with injections    lisinopril-hydrochlorothiazide (ZESTORETIC) 10-12.5 MG tablet Take 1 tablet by mouth daily.   Multiple Vitamins-Minerals (MULTIVITAMIN ADULTS 50+) TABS Take 1 tablet by mouth daily in the afternoon.   Vitamin D, Ergocalciferol, (DRISDOL) 1.25 MG (50000 UNIT) CAPS capsule Take 1 capsule (50,000 Units total) by mouth every 7 (seven) days.   [DISCONTINUED] NOVOLOG FLEXPEN 100 UNIT/ML FlexPen INJECT 22 TO 28 UNITS SUBCUTANEOUSLY THREE TIMES DAILY WITH MEALS   Continuous Blood Gluc Receiver (DEXCOM G6 RECEIVER) DEVI Use device to check glucose continuously. (Patient not taking: Reported on 12/30/2020)   Continuous Blood Gluc Sensor (DEXCOM G6 SENSOR) MISC Change sensor every 10 days as directed (Patient not taking: Reported on 12/30/2020)   Continuous Blood Gluc Transmit (DEXCOM G6 TRANSMITTER) MISC Change transmitter every 90 days as directed. (Patient not taking: Reported on 12/30/2020)   NOVOLOG FLEXPEN 100 UNIT/ML FlexPen INJECT 18 TO 24 UNITS SUBCUTANEOUSLY THREE TIMES DAILY WITH MEALS   No facility-administered encounter medications on file as of 12/30/2020.   ALLERGIES: No Known Allergies VACCINATION STATUS:  There is no immunization history on file for this patient.  Diabetes She presents for her follow-up diabetic visit. She has type 1 (She is administratively classified as type 1 diabetes for management purposes.) diabetes mellitus. Onset time: She was diagnosed at approximate age of 60 years. Her disease course has been improving. Hypoglycemia symptoms include nervousness/anxiousness, sweats and tremors.  Pertinent negatives for hypoglycemia include no confusion, headaches, pallor or seizures. Associated symptoms include fatigue, polydipsia and polyuria. Pertinent negatives for diabetes include no blurred vision, no chest pain, no foot paresthesias, no polyphagia and no weight loss. There are no hypoglycemic complications. Symptoms are improving. Diabetic complications include nephropathy and  peripheral neuropathy. Risk factors for coronary artery disease include diabetes mellitus, sedentary lifestyle, hypertension, dyslipidemia and stress. Current diabetic treatment includes intensive insulin program. She is compliant with treatment most of the time (has been more consistent taking her insulin recently). Her weight is fluctuating dramatically. She is following a generally healthy diet. When asked about meal planning, she reported none. She has had a previous visit with a dietitian. She never participates in exercise. Her home blood glucose trend is fluctuating dramatically. Her overall blood glucose range is >200 mg/dl. (She presents today with her meter and logs showing improved glycemic profile overall with continued fluctuations.  She says she has been less stressed since last visit and has been more consistent with taking her medications.  She feels better overall.  She is not due for another A1c today.  Analysis of her meter shows 7-day average of 213, 14-day average of 197, 30-day average of 216, and 90-day average of 282.  She did miss some injections due to toothache.) An ACE inhibitor/angiotensin II receptor blocker is being taken. She does not see a podiatrist.Eye exam is current.  Hypertension This is a chronic problem. The current episode started more than 1 year ago. The problem is unchanged. The problem is uncontrolled. Associated symptoms include sweats. Pertinent negatives include no blurred vision, chest pain, headaches, palpitations or shortness of breath. There are no associated agents to hypertension. Risk factors for coronary artery disease include diabetes mellitus, dyslipidemia, sedentary lifestyle and post-menopausal state. Past treatments include ACE inhibitors and diuretics. The current treatment provides mild improvement. Compliance problems include diet, exercise and psychosocial issues.  Hypertensive end-organ damage includes kidney disease. Identifiable causes of  hypertension include chronic renal disease.   Review of systems  Constitutional: + drastically fluctuating body weight,  current Body mass index is 30.35 kg/m. , no fatigue, no subjective hyperthermia, no subjective hypothermia Eyes: no blurry vision, no xerophthalmia ENT: no sore throat, no nodules palpated in throat, no dysphagia/odynophagia, no hoarseness Cardiovascular: no chest pain, no shortness of breath, no palpitations, no leg swelling Respiratory: no cough, no shortness of breath Gastrointestinal: no nausea/vomiting/diarrhea Musculoskeletal: no muscle/joint aches, walks with cane Skin: no rashes, no hyperemia Neurological: no tremors, no dizziness, intermittent numbness/tingling to bilateral feet Psychiatric: no depression, no anxiety  Objective:    BP (!) 177/98    Pulse 98    Ht 5' (1.524 m)    Wt 155 lb 6.4 oz (70.5 kg)    SpO2 99%    BMI 30.35 kg/m   Wt Readings from Last 3 Encounters:  12/30/20 155 lb 6.4 oz (70.5 kg)  12/30/20 156 lb (70.8 kg)  12/16/20 149 lb (67.6 kg)    BP Readings from Last 3 Encounters:  12/30/20 (!) 177/98  12/16/20 (!) 162/96  07/31/20 (!) 156/88    Physical Exam- Limited  Constitutional:  Body mass index is 30.35 kg/m. , not in acute distress, normal state of mind Eyes:  EOMI, no exophthalmos Neck: Supple Cardiovascular: RRR, no murmurs, rubs, or gallops, no edema Respiratory: Adequate breathing efforts, no crackles, rales, rhonchi, or wheezing Musculoskeletal: no gross deformities, strength intact in all four extremities, no gross restriction of joint movements, walks with  cane Skin:  no rashes, no hyperemia Neurological: no tremor with outstretched hands   Recent Results (from the past 2160 hour(s))  Comprehensive metabolic panel     Status: Abnormal   Collection Time: 12/11/20  8:21 AM  Result Value Ref Range   Glucose 367 (H) 70 - 99 mg/dL   BUN 28 (H) 6 - 24 mg/dL   Creatinine, Ser 1.67 (H) 0.57 - 1.00 mg/dL   eGFR 35  (L) >59 mL/min/1.73   BUN/Creatinine Ratio 17 9 - 23   Sodium 135 134 - 144 mmol/L   Potassium 4.1 3.5 - 5.2 mmol/L   Chloride 94 (L) 96 - 106 mmol/L   CO2 24 20 - 29 mmol/L   Calcium 9.2 8.7 - 10.2 mg/dL   Total Protein 7.3 6.0 - 8.5 g/dL   Albumin 4.1 3.8 - 4.9 g/dL   Globulin, Total 3.2 1.5 - 4.5 g/dL   Albumin/Globulin Ratio 1.3 1.2 - 2.2   Bilirubin Total 0.2 0.0 - 1.2 mg/dL   Alkaline Phosphatase 127 (H) 44 - 121 IU/L   AST 15 0 - 40 IU/L   ALT 9 0 - 32 IU/L  Lipid panel     Status: Abnormal   Collection Time: 12/11/20  8:21 AM  Result Value Ref Range   Cholesterol, Total 234 (H) 100 - 199 mg/dL   Triglycerides 314 (H) 0 - 149 mg/dL   HDL 42 >39 mg/dL   VLDL Cholesterol Cal 57 (H) 5 - 40 mg/dL   LDL Chol Calc (NIH) 135 (H) 0 - 99 mg/dL   Chol/HDL Ratio 5.6 (H) 0.0 - 4.4 ratio    Comment:                                   T. Chol/HDL Ratio                                             Men  Women                               1/2 Avg.Risk  3.4    3.3                                   Avg.Risk  5.0    4.4                                2X Avg.Risk  9.6    7.1                                3X Avg.Risk 23.4   11.0   VITAMIN D 25 Hydroxy (Vit-D Deficiency, Fractures)     Status: Abnormal   Collection Time: 12/11/20  8:21 AM  Result Value Ref Range   Vit D, 25-Hydroxy 14.5 (L) 30.0 - 100.0 ng/mL    Comment: Vitamin D deficiency has been defined by the [place] practice guideline as a level of serum 25-OH vitamin D less than 20 ng/mL (1,2). The Endocrine Society went on  to further define vitamin D insufficiency as a level between 21 and 29 ng/mL (2). 1. IOM (Institute of Medicine). 2010. Dietary reference    intakes for calcium and D. [place]: The    [employer]. 2. Holick MF, Binkley [place], Bischoff-Ferrari HA, et al.    Evaluation, treatment, and prevention of vitamin D    deficiency: an Endocrine Society clinical  practice    guideline. JCEM. 2011 Jul; 96(7):1911-30.   POCT glycosylated hemoglobin (Hb A1C)     Status: Abnormal   Collection Time: 12/16/20  9:51 AM  Result Value Ref Range   Hemoglobin A1C     HbA1c POC (<> result, manual entry)     HbA1c, POC (prediabetic range)     HbA1c, POC (controlled diabetic range) 12.5 (A) 0.0 - 7.0 %    Assessment & Plan:    1) Uncontrolled type 1 diabetes mellitus-chronically uncontrolled  - Patient has currently uncontrolled symptomatic type 1 DM (administrative reclassified as type I for management purposes) since  60 years of age.  She presents today with her meter and logs showing improved glycemic profile overall with continued fluctuations.  She says she has been less stressed since last visit and has been more consistent with taking her medications.  She feels better overall.  She is not due for another A1c today.  Analysis of her meter shows 7-day average of 213, 14-day average of 197, 30-day average of 216, and 90-day average of 282.  She did miss some injections due to toothache.   - Her diabetes is complicated by neuropathy, patient remains at a high risk for more acute and chronic complications of diabetes which include CAD, CVA, CKD, retinopathy, and neuropathy. These are all discussed in detail with the patient.  - Nutritional counseling repeated at each appointment due to patients tendency to fall back in to old habits.  - The patient admits there is a room for improvement in their diet and drink choices. -  Suggestion is made for the patient to avoid simple carbohydrates from their diet including Cakes, Sweet Desserts / Pastries, Ice Cream, Soda (diet and regular), Sweet Tea, Candies, Chips, Cookies, Sweet Pastries, Store Bought Juices, Alcohol in Excess of 1-2 drinks a day, Artificial Sweeteners, Coffee Creamer, and "Sugar-free" Products. This will help patient to have stable blood glucose profile and potentially avoid unintended weight  gain.   - I encouraged the patient to switch to unprocessed or minimally processed complex starch and increased protein intake (animal or plant source), fruits, and vegetables.   - Patient is advised to stick to a routine mealtimes to eat 3 meals a day and avoid unnecessary snacks (to snack only to correct hypoglycemia).  -She has seen Jearld Fenton, RDE in the past for individualized education, saw her today for follow up.  - I have approached patient with the following individualized plan to manage diabetes and patient agrees:   -She will continue to need intensive treatment with higher dose of basal/bolus insulin in order for her to achieve and maintain control of diabetes to target.   -She is advised to continue Levemir 40 units SQ nightly and be more consistent with injecting her Novolog 18-24 units TID with meals if glucose is above 90 and she is eating (Specific instructions on how to titrate insulin dosage based on glucose readings given to patient in writing).     -She is encouraged start consistently monitoring blood glucose 4 times daily, before meals and at bedtime and report  to the clinic if blood glucose levels are less than 70 or greater than 300 for 3 tests in a row. She has not yet received her Dexcom (awaiting ins approval).  - She is not a candidate for metformin, SGLT2 inhibitors, nor incretin therapy.  - Patient specific target  A1c;  LDL, HDL, Triglycerides, and  Waist Circumference were discussed in detail.  2) BP/HTN:  Her blood pressure is not controlled to target.  She reports she has been taking her BP meds as prescribed.  She is advised to continue Lisinopril-HCT 10-12.5 mg po daily.    3) Lipids/HPL: Her recent lipid panel from 12/11/20 shows uncontrolled LDL at 135 and triglycerides of 314 (worsening).  She has not been taking her Lipitor as prescribed.  I reinitiated Lipitor 10 mg po daily at bedtime.    4)  Weight/Diet: Her Body mass index is 30.35 kg/m.    Further weight gain not recommended for her.  2 years ago, she did have significant weight deficit.  CDE Consult in progress , exercise, and detailed carbohydrates information provided.  Side effects and precautions discussed with her.  5) Vitamin D deficiency.   Her recent vitamin D level was 14.5 on 12/11/20.  She is currently only taking MVI.  I discussed and initiated replenishment with Ergocalciferol 50000 units PO weekly x 12 weeks.  6) Chronic Care/Health Maintenance: -Patient is on ACEI/ARB medications and Statin medication and encouraged to continue to follow up with Ophthalmology, Podiatrist at least yearly or according to recommendations, and advised to   stay away from smoking. I have recommended yearly flu vaccine and pneumonia vaccination at least every 5 years; moderate intensity exercise for up to 150 minutes weekly; and  sleep for at least 7 hours a day.    - I advised patient to maintain close follow up with House, Deliah Goody, FNP for primary care needs.     I spent 40 minutes in the care of the patient today including review of labs from [facility], Lipids, Thyroid Function, Hematology (current and previous including abstractions from other facilities); face-to-face time discussing  her blood glucose readings/logs, discussing hypoglycemia and hyperglycemia episodes and symptoms, medications doses, her options of short and long term treatment based on the latest standards of care / guidelines;  discussion about incorporating lifestyle medicine;  and documenting the encounter.    Please refer to Patient Instructions for Blood Glucose Monitoring and Insulin/Medications Dosing Guide"  in media tab for additional information. Please  also refer to " Patient Self Inventory" in the Media  tab for reviewed elements of pertinent patient history.  [name] participated in the discussions, expressed understanding, and voiced agreement with the above plans.  All questions were answered to her  satisfaction. she is encouraged to contact clinic should she have any questions or concerns prior to her return visit.  Follow up plan: - Return in about 3 months (around 03/30/2021) for Diabetes F/U with A1c in office, No previsit labs, Bring meter and logs.  Rayetta Pigg, [facility] [address] [place], [place] [address] Phone: [phone] Fax: [phone]   12/30/2020, 10:19 AM

## 2020-12-30 NOTE — Patient Instructions (Addendum)
Goals  Drink more water 5 bottles per day Cut out juice and sodas Increase fresh fruits and vegetables Don't skip meals Keep up the great job!! Keep walking Keep talking to your friend for emotional support or listen to radio/pray.

## 2021-01-02 ENCOUNTER — Encounter: Payer: Self-pay | Admitting: Nutrition

## 2021-01-02 NOTE — Patient Instructions (Signed)
Goals Established by Pt Eat three meals per day Test blood sugars before meals and use sliding scale insulin  Drink only water Cut out sodas and sweets Don't skip meals Call [facility] to schedule therapy session.

## 2021-02-04 ENCOUNTER — Other Ambulatory Visit: Payer: Self-pay | Admitting: "Endocrinology

## 2021-02-04 DIAGNOSIS — E1065 Type 1 diabetes mellitus with hyperglycemia: Secondary | ICD-10-CM

## 2021-03-31 ENCOUNTER — Ambulatory Visit: Payer: Medicaid Other | Admitting: Nurse Practitioner

## 2021-03-31 NOTE — Patient Instructions (Incomplete)
Diabetes Mellitus Emergency Preparedness Plan ?A diabetes emergency preparedness plan is a checklist to make sure you have everything you need to manage your diabetes in case of an emergency, such as an evacuation, natural disaster, national security emergency, or pandemic lockdown. ?Managing your diabetes is something you have to do all day every day. The American Diabetes Association and the American College of Endocrinology both recommend putting together an emergency diabetes kit. Your kit should include important information and documents as well as all the supplies you will need to manage your diabetes for at least 1 week. Store it in a portable, waterproof bag or container. The best time to start making your emergency kit is now. ?How to make your emergency kit ?Collect information and documents ?Include the following information and documents in your kit: ?The type of diabetes you have. ?A copy of your health insurance cards and photo ID. ?A list of all your other medical conditions, allergies, and surgeries. ?A list of all your medicines and doses with the contact information for your pharmacy. Ask your health care provider for a list of your current medicines. ?Any recent lab results, including your latest hemoglobin A1C (HbA1C). ?The make, model, and serial number of your insulin pump, if you use one. Also include contact information for the manufacturer. ?Contact information for people who should be notified in case of an emergency. Include your health care provider's name, address, and phone number. ?Collect diabetes care items ?Include the following diabetes care items in your kit: ?At least a 1-week supply of: ?Oral medicines. ?Insulin. ?Blood glucose testing supplies. These include testing strips, lancets, and extra batteries for your blood glucose monitor and pump. ?A charger for the continuous glucose monitor (CGM) receiver and pump. ?Any extra supplies needed for your CGM or pump. ?A supply of  glucagon, glucose tablets, juice, soda, or hard candy in case of hypoglycemia. ?Coolers or cold packs. ?A safe container for syringes, needles, and lancets. ? ?Other preparations ?Other things to consider doing as part of your emergency plan: ?Make sure that your mobile phone is charged and that you have an extra charger, cable, or batteries. ?Choose a meeting place for family members. ?Wear a medical alert or ID bracelet. ?If you have a child with diabetes, make sure your child's school has a copy of his or her emergency plan, including the name of the staff member who will assist your child. ?Where to find more information ?American Diabetes Association: www.diabetes.org ?Centers for Disease Control and Prevention: blogs.cdc.gov ?Summary ?A diabetes emergency preparedness plan is a checklist to make sure you have everything you need in case of an emergency. ?Your kit should include important information and documents as well as all the supplies you will need to manage your condition for at least 1 week. ?Store your kit in a portable, waterproof bag or container. ?The best time to start making your emergency kit is now. ?This information is not intended to replace advice given to you by your health care provider. Make sure you discuss any questions you have with your health care provider. ?Document Revised: 07/06/2019 Document Reviewed: 07/06/2019 ?Elsevier Patient Education ? 2022 Elsevier Inc. ? ?

## 2021-04-18 ENCOUNTER — Other Ambulatory Visit: Payer: Self-pay | Admitting: Nurse Practitioner

## 2021-05-01 ENCOUNTER — Encounter: Payer: Self-pay | Admitting: Nurse Practitioner

## 2021-05-01 ENCOUNTER — Ambulatory Visit (INDEPENDENT_AMBULATORY_CARE_PROVIDER_SITE_OTHER): Payer: Medicaid Other | Admitting: Nurse Practitioner

## 2021-05-01 VITALS — BP 167/97 | HR 112 | Ht 60.0 in | Wt 157.2 lb

## 2021-05-01 DIAGNOSIS — E1022 Type 1 diabetes mellitus with diabetic chronic kidney disease: Secondary | ICD-10-CM

## 2021-05-01 DIAGNOSIS — E1065 Type 1 diabetes mellitus with hyperglycemia: Secondary | ICD-10-CM

## 2021-05-01 DIAGNOSIS — E782 Mixed hyperlipidemia: Secondary | ICD-10-CM

## 2021-05-01 DIAGNOSIS — N183 Chronic kidney disease, stage 3 unspecified: Secondary | ICD-10-CM

## 2021-05-01 DIAGNOSIS — E559 Vitamin D deficiency, unspecified: Secondary | ICD-10-CM | POA: Diagnosis not present

## 2021-05-01 DIAGNOSIS — I10 Essential (primary) hypertension: Secondary | ICD-10-CM

## 2021-05-01 LAB — POCT GLYCOSYLATED HEMOGLOBIN (HGB A1C): HbA1c, POC (controlled diabetic range): 11.3 % — AB (ref 0.0–7.0)

## 2021-05-01 MED ORDER — NOVOLOG FLEXPEN 100 UNIT/ML ~~LOC~~ SOPN: 12.0000 [IU] | PEN_INJECTOR | Freq: Three times a day (TID) | SUBCUTANEOUS | 3 refills | Status: DC

## 2021-05-01 MED ORDER — LEVEMIR FLEXTOUCH 100 UNIT/ML ~~LOC~~ SOPN: 20.0000 [IU] | PEN_INJECTOR | Freq: Every day | SUBCUTANEOUS | 3 refills | Status: DC

## 2021-05-01 MED ORDER — DEXCOM G6 RECEIVER DEVI: 0 refills | Status: DC

## 2021-05-01 MED ORDER — DEXCOM G6 TRANSMITTER MISC: 3 refills | Status: DC

## 2021-05-01 MED ORDER — DEXCOM G6 SENSOR MISC: 3 refills | Status: DC

## 2021-05-01 NOTE — Patient Instructions (Signed)
Diabetes Mellitus and Foot Care Foot care is an important part of your health, especially when you have diabetes. Diabetes may cause you to have problems because of poor blood flow (circulation) to your feet and legs, which can cause your skin to: Become thinner and drier. Break more easily. Heal more slowly. Peel and crack. You may also have nerve damage (neuropathy) in your legs and feet, causing decreased feeling in them. This means that you may not notice minor injuries to your feet that could lead to more serious problems. Noticing and addressing any potential problems early is the best way to prevent future foot problems. How to care for your feet Foot hygiene  Wash your feet daily with warm water and mild soap. Do not use hot water. Then, pat your feet and the areas between your toes until they are completely dry. Do not soak your feet as this can dry your skin. Trim your toenails straight across. Do not dig under them or around the cuticle. File the edges of your nails with an emery board or nail file. Apply a moisturizing lotion or petroleum jelly to the skin on your feet and to dry, brittle toenails. Use lotion that does not contain alcohol and is unscented. Do not apply lotion between your toes. Shoes and socks Wear clean socks or stockings every day. Make sure they are not too tight. Do not wear knee-high stockings since they may decrease blood flow to your legs. Wear shoes that fit properly and have enough cushioning. Always look in your shoes before you put them on to be sure there are no objects inside. To break in new shoes, wear them for just a few hours a day. This prevents injuries on your feet. Wounds, scrapes, corns, and calluses  Check your feet daily for blisters, cuts, bruises, sores, and redness. If you cannot see the bottom of your feet, use a mirror or ask someone for help. Do not cut corns or calluses or try to remove them with medicine. If you find a minor scrape,  cut, or break in the skin on your feet, keep it and the skin around it clean and dry. You may clean these areas with mild soap and water. Do not clean the area with peroxide, alcohol, or iodine. If you have a wound, scrape, corn, or callus on your foot, look at it several times a day to make sure it is healing and not infected. Check for: Redness, swelling, or pain. Fluid or blood. Warmth. Pus or a bad smell. General tips Do not cross your legs. This may decrease blood flow to your feet. Do not use heating pads or hot water bottles on your feet. They may burn your skin. If you have lost feeling in your feet or legs, you may not know this is happening until it is too late. Protect your feet from hot and cold by wearing shoes, such as at the beach or on hot pavement. Schedule a complete foot exam at least once a year (annually) or more often if you have foot problems. Report any cuts, sores, or bruises to your health care provider immediately. Where to find more information American Diabetes Association: www.diabetes.org Association of Diabetes [employer] Education Specialists: www.diabeteseducator.org Contact a health care provider if: You have a medical condition that increases your risk of infection and you have any cuts, sores, or bruises on your feet. You have an injury that is not healing. You have redness on your legs or feet. You   feel burning or tingling in your legs or feet. You have pain or cramps in your legs and feet. Your legs or feet are numb. Your feet always feel cold. You have pain around any toenails. Get help right away if: You have a wound, scrape, corn, or callus on your foot and: You have pain, swelling, or redness that gets worse. You have fluid or blood coming from the wound, scrape, corn, or callus. Your wound, scrape, corn, or callus feels warm to the touch. You have pus or a bad smell coming from the wound, scrape, corn, or callus. You have a fever. You have a red  line going up your leg. Summary Check your feet every day for blisters, cuts, bruises, sores, and redness. Apply a moisturizing lotion or petroleum jelly to the skin on your feet and to dry, brittle toenails. Wear shoes that fit properly and have enough cushioning. If you have foot problems, report any cuts, sores, or bruises to your health care provider immediately. Schedule a complete foot exam at least once a year (annually) or more often if you have foot problems. This information is not intended to replace advice given to you by your health care provider. Make sure you discuss any questions you have with your health care provider. Document Revised: 07/20/2019 Document Reviewed: 07/20/2019 Elsevier Patient Education  2023 [employer]  

## 2021-05-01 NOTE — Progress Notes (Signed)
05/01/2021 ? ?Endocrinology follow-up note ? ? ? ?Subjective:  ? ? Patient ID: Katherine Patton, female    DOB: 01-15-1960. Patient is being seen in follow-up for management of chronically uncontrolled diabetes . ?PCP: Bradd Burner, FNP. ? ?Past Medical History:  ?Diagnosis Date  ? Diabetes mellitus without complication ([place])   ? Uterine mass   ? ?Past Surgical History:  ?Procedure Laterality Date  ? CATARACT EXTRACTION    ? ?Social History  ? ?Socioeconomic History  ? Marital status: Single  ?  Spouse name: Not on file  ? Number of children: Not on file  ? Years of education: Not on file  ? Highest education level: Not on file  ?Occupational History  ? Not on file  ?Tobacco Use  ? Smoking status: Never  ? Smokeless tobacco: Never  ?Vaping Use  ? Vaping Use: Never used  ?Substance and Sexual Activity  ? Alcohol use: No  ? Drug use: No  ? Sexual activity: Not Currently  ?  Birth control/protection: None  ?Other Topics Concern  ? Not on file  ?Social History Narrative  ? Not on file  ? ?Social Determinants of Health  ? ?Financial Resource Strain: Not on file  ?Food Insecurity: Not on file  ?Transportation Needs: Not on file  ?Physical Activity: Not on file  ?Stress: Not on file  ?Social Connections: Not on file  ? ?Outpatient Encounter Medications as of 05/01/2021  ?Medication Sig  ? ACCU-CHEK GUIDE test strip USE 1 STRIP TO CHECK GLUCOSE 4 TIMES DAILY  ? acetaminophen (TYLENOL) 500 MG tablet Take 500 mg by mouth every 6 (six) hours as needed for headache.  ? atorvastatin (LIPITOR) 10 MG tablet Take 1 tablet (10 mg total) by mouth daily.  ? Cholecalciferol 2000 units CAPS Take 1 capsule (2,000 Units total) by mouth daily with breakfast.  ? Insulin Pen Needle (RELION PEN NEEDLES) 32G X 4 MM MISC Use three times daily with injections  ? lisinopril-hydrochlorothiazide (ZESTORETIC) 10-12.5 MG tablet Take 1 tablet by mouth daily.  ? Multiple Vitamins-Minerals (MULTIVITAMIN ADULTS 50+) TABS Take 1 tablet by mouth daily in the  afternoon.  ? Vitamin D, Ergocalciferol, (DRISDOL) 1.25 MG (50000 UNIT) CAPS capsule Take 1 capsule (50,000 Units total) by mouth every 7 (seven) days.  ? [DISCONTINUED] insulin detemir (LEVEMIR FLEXTOUCH) 100 UNIT/ML FlexPen Inject 40 Units into the skin at bedtime.  ? [DISCONTINUED] NOVOLOG FLEXPEN 100 UNIT/ML FlexPen INJECT 18 TO 24 UNITS SUBCUTANEOUSLY THREE TIMES DAILY WITH MEALS  ? Continuous Blood Gluc Receiver (DEXCOM G6 RECEIVER) DEVI Use device to check glucose continuously.  ? Continuous Blood Gluc Sensor (DEXCOM G6 SENSOR) MISC Change sensor every 10 days as directed  ? Continuous Blood Gluc Transmit (DEXCOM G6 TRANSMITTER) MISC Change transmitter every 90 days as directed.  ? insulin detemir (LEVEMIR FLEXTOUCH) 100 UNIT/ML FlexPen Inject 20 Units into the skin at bedtime.  ? NOVOLOG FLEXPEN 100 UNIT/ML FlexPen Inject 12-18 Units into the skin 3 (three) times daily with meals. INJECT 18 TO 24 UNITS SUBCUTANEOUSLY THREE TIMES DAILY WITH MEALS  ? [DISCONTINUED] Continuous Blood Gluc Receiver (DEXCOM G6 RECEIVER) DEVI Use device to check glucose continuously. (Patient not taking: Reported on 12/30/2020)  ? [DISCONTINUED] Continuous Blood Gluc Sensor (DEXCOM G6 SENSOR) MISC Change sensor every 10 days as directed (Patient not taking: Reported on 12/30/2020)  ? [DISCONTINUED] Continuous Blood Gluc Transmit (DEXCOM G6 TRANSMITTER) MISC Change transmitter every 90 days as directed. (Patient not taking: Reported on 12/30/2020)  ? ?No facility-administered encounter  medications on file as of 05/01/2021.  ? ?ALLERGIES: ?No Known Allergies ?VACCINATION STATUS: ? ?There is no immunization history on file for this patient. ? ?Diabetes ?She presents for her follow-up diabetic visit. She has type 1 (She is administratively classified as type 1 diabetes for management purposes.) diabetes mellitus. Onset time: She was diagnosed at approximate age of 78 years. Her disease course has been fluctuating. Hypoglycemia symptoms  include nervousness/anxiousness, sweats and tremors. Pertinent negatives for hypoglycemia include no confusion, headaches, pallor or seizures. Associated symptoms include fatigue, polydipsia and polyuria. Pertinent negatives for diabetes include no blurred vision, no chest pain, no foot paresthesias, no polyphagia and no weight loss. There are no hypoglycemic complications. Symptoms are improving. Diabetic complications include nephropathy and peripheral neuropathy. Risk factors for coronary artery disease include diabetes mellitus, sedentary lifestyle, hypertension, dyslipidemia and stress. Current diabetic treatment includes intensive insulin program. She is compliant with treatment some of the time (has been more consistent taking her insulin recently). Her weight is fluctuating dramatically. She is following a generally healthy diet. When asked about meal planning, she reported none. She has had a previous visit with a dietitian. She never participates in exercise. Her home blood glucose trend is fluctuating dramatically. Her overall blood glucose range is >200 mg/dl. (She presents today with her meter and logs showing continued fluctuating glycemic profile.  Her POCT A1c today is 11.3%, improving from last visit of 12.5%.  She has lows, overcorrects, then has significant high readings after.  Analysis of her meter shows 7-day average of 225, 14-day average of 236, 30-day average of 259, 90-day average of 273.  She still misses opportunities to inject her insulin due to fluctuating in glucose.) An ACE inhibitor/angiotensin II receptor blocker is being taken. She does not see a podiatrist.Eye exam is current.  ?Hypertension ?This is a chronic problem. The current episode started more than 1 year ago. The problem is unchanged. The problem is uncontrolled. Associated symptoms include sweats. Pertinent negatives include no blurred vision, chest pain, headaches, palpitations or shortness of breath. There are no  associated agents to hypertension. Risk factors for coronary artery disease include diabetes mellitus, dyslipidemia, sedentary lifestyle and post-menopausal state. Past treatments include ACE inhibitors and diuretics. The current treatment provides mild improvement. Compliance problems include diet, exercise and psychosocial issues.  Hypertensive end-organ damage includes kidney disease. Identifiable causes of hypertension include chronic renal disease.  ? ?Review of systems ? ?Constitutional: + minimally fluctuating body weight,  current Body mass index is 30.7 kg/m?. , no fatigue, no subjective hyperthermia, no subjective hypothermia ?Eyes: no blurry vision, no xerophthalmia ?ENT: no sore throat, no nodules palpated in throat, no dysphagia/odynophagia, no hoarseness ?Cardiovascular: no chest pain, no shortness of breath, no palpitations, no leg swelling ?Respiratory: no cough, no shortness of breath ?Gastrointestinal: no nausea/vomiting/diarrhea ?Musculoskeletal: no muscle/joint aches, walks with cane ?Skin: no rashes, no hyperemia ?Neurological: no tremors, no dizziness, intermittent numbness/tingling to bilateral feet ?Psychiatric: no depression, no anxiety ? ?Objective:  ?  ?BP (!) 167/97   Pulse (!) 112   Ht 5' (1.524 m)   Wt 157 lb 3.2 oz (71.3 kg)   SpO2 98%   BMI 30.70 kg/m?   ?Wt Readings from Last 3 Encounters:  ?05/01/21 157 lb 3.2 oz (71.3 kg)  ?12/30/20 155 lb 6.4 oz (70.5 kg)  ?12/30/20 156 lb (70.8 kg)  ?  ?BP Readings from Last 3 Encounters:  ?05/01/21 (!) 167/97  ?12/30/20 (!) 177/98  ?12/16/20 (!) 162/96  ? ? ?Physical  Exam- Limited ? ?Constitutional:  Body mass index is 30.7 kg/m?. , not in acute distress, normal state of mind ?Eyes:  EOMI, no exophthalmos ?Neck: Supple ?Cardiovascular: RRR, no murmurs, rubs, or gallops, no edema ?Respiratory: Adequate breathing efforts, no crackles, rales, rhonchi, or wheezing ?Musculoskeletal: no gross deformities, strength intact in all four extremities,  no gross restriction of joint movements, walks with cane ?Skin:  no rashes, no hyperemia ?Neurological: no tremor with outstretched hands ? ?Diabetic Foot Exam - Simple   ?Simple Foot Form ?Diabetic Foot exam wa

## 2021-05-07 ENCOUNTER — Ambulatory Visit: Payer: Medicaid Other

## 2021-07-24 LAB — COMPREHENSIVE METABOLIC PANEL
ALT: 9 IU/L (ref 0–32)
AST: 16 IU/L (ref 0–40)
Albumin/Globulin Ratio: 1.2 (ref 1.2–2.2)
Albumin: 3.8 g/dL (ref 3.8–4.9)
Alkaline Phosphatase: 115 IU/L (ref 44–121)
BUN/Creatinine Ratio: 15 (ref 9–23)
BUN: 24 mg/dL (ref 6–24)
Bilirubin Total: <0.2 mg/dL (ref 0.0–1.2)
CO2: 22 mmol/L (ref 20–29)
Calcium: 8.8 mg/dL (ref 8.7–10.2)
Chloride: 105 mmol/L (ref 96–106)
Creatinine, Ser: 1.59 mg/dL — ABNORMAL HIGH (ref 0.57–1.00)
Globulin, Total: 3.2 g/dL (ref 1.5–4.5)
Glucose: 210 mg/dL — ABNORMAL HIGH (ref 70–99)
Potassium: 4.4 mmol/L (ref 3.5–5.2)
Sodium: 140 mmol/L (ref 134–144)
Total Protein: 7 g/dL (ref 6.0–8.5)
eGFR: 37 mL/min/{1.73_m2} — ABNORMAL LOW (ref >59)

## 2021-08-01 ENCOUNTER — Ambulatory Visit: Payer: Medicaid Other | Admitting: Nurse Practitioner

## 2021-08-27 ENCOUNTER — Encounter: Payer: Self-pay | Admitting: Nurse Practitioner

## 2021-08-27 ENCOUNTER — Ambulatory Visit (INDEPENDENT_AMBULATORY_CARE_PROVIDER_SITE_OTHER): Payer: Medicaid Other | Admitting: Nurse Practitioner

## 2021-08-27 VITALS — BP 156/81 | HR 72 | Ht 60.0 in | Wt 155.0 lb

## 2021-08-27 DIAGNOSIS — E1065 Type 1 diabetes mellitus with hyperglycemia: Secondary | ICD-10-CM

## 2021-08-27 DIAGNOSIS — E782 Mixed hyperlipidemia: Secondary | ICD-10-CM

## 2021-08-27 DIAGNOSIS — E1022 Type 1 diabetes mellitus with diabetic chronic kidney disease: Secondary | ICD-10-CM

## 2021-08-27 DIAGNOSIS — E559 Vitamin D deficiency, unspecified: Secondary | ICD-10-CM | POA: Diagnosis not present

## 2021-08-27 DIAGNOSIS — I10 Essential (primary) hypertension: Secondary | ICD-10-CM | POA: Diagnosis not present

## 2021-08-27 DIAGNOSIS — N183 Chronic kidney disease, stage 3 unspecified: Secondary | ICD-10-CM

## 2021-08-27 LAB — POCT GLYCOSYLATED HEMOGLOBIN (HGB A1C): HbA1c POC (<> result, manual entry): 12 % (ref 4.0–5.6)

## 2021-08-27 MED ORDER — RELION PEN NEEDLES 32G X 4 MM MISC: 3 refills | Status: DC

## 2021-08-27 MED ORDER — ACCU-CHEK GUIDE VI STRP: ORAL_STRIP | 6 refills | Status: DC

## 2021-08-27 MED ORDER — NOVOLOG FLEXPEN 100 UNIT/ML ~~LOC~~ SOPN: 12.0000 [IU] | PEN_INJECTOR | Freq: Three times a day (TID) | SUBCUTANEOUS | 3 refills | Status: DC

## 2021-08-27 MED ORDER — LEVEMIR FLEXTOUCH 100 UNIT/ML ~~LOC~~ SOPN: 25.0000 [IU] | PEN_INJECTOR | Freq: Every day | SUBCUTANEOUS | 3 refills | Status: DC

## 2021-08-27 NOTE — Progress Notes (Signed)
08/27/2021  Endocrinology follow-up note    Subjective:    Patient ID: Katherine Patton, female    DOB: 11-Dec-1960. Patient is being seen in follow-up for management of chronically uncontrolled diabetes . PCP: Bradd Burner, FNP.  Past Medical History:  Diagnosis Date   Diabetes mellitus without complication ([place])    Uterine mass    Past Surgical History:  Procedure Laterality Date   CATARACT EXTRACTION     Social History   Socioeconomic History   Marital status: Single    Spouse name: Not on file   Number of children: Not on file   Years of education: Not on file   Highest education level: Not on file  Occupational History   Not on file  Tobacco Use   Smoking status: Never   Smokeless tobacco: Never  Vaping Use   Vaping Use: Never used  Substance and Sexual Activity   Alcohol use: No   Drug use: No   Sexual activity: Not Currently    Birth control/protection: None  Other Topics Concern   Not on file  Social History Narrative   Not on file   Social Determinants of Health   Financial Resource Strain: Not on file  Food Insecurity: Not on file  Transportation Needs: Not on file  Physical Activity: Not on file  Stress: Not on file  Social Connections: Not on file   Outpatient Encounter Medications as of 08/27/2021  Medication Sig   acetaminophen (TYLENOL) 500 MG tablet Take 500 mg by mouth every 6 (six) hours as needed for headache.   atorvastatin (LIPITOR) 10 MG tablet Take 1 tablet (10 mg total) by mouth daily.   Cholecalciferol 2000 units CAPS Take 1 capsule (2,000 Units total) by mouth daily with breakfast.   glucose blood (ACCU-CHEK GUIDE) test strip Use as instructed to monitor glucose 4 times daily   insulin detemir (LEVEMIR FLEXTOUCH) 100 UNIT/ML FlexPen Inject 25 Units into the skin at bedtime.   Insulin Pen Needle (RELION PEN NEEDLES) 32G X 4 MM MISC Use three times daily with injections   lisinopril-hydrochlorothiazide (ZESTORETIC) 10-12.5 MG tablet  Take 1 tablet by mouth daily.   Multiple Vitamins-Minerals (MULTIVITAMIN ADULTS 50+) TABS Take 1 tablet by mouth daily in the afternoon.   NOVOLOG FLEXPEN 100 UNIT/ML FlexPen Inject 12-18 Units into the skin 3 (three) times daily with meals. INJECT 18 TO 24 UNITS SUBCUTANEOUSLY THREE TIMES DAILY WITH MEALS   Vitamin D, Ergocalciferol, (DRISDOL) 1.25 MG (50000 UNIT) CAPS capsule Take 1 capsule (50,000 Units total) by mouth every 7 (seven) days. (Patient not taking: Reported on 08/27/2021)   [DISCONTINUED] ACCU-CHEK GUIDE test strip USE 1 STRIP TO CHECK GLUCOSE 4 TIMES DAILY   [DISCONTINUED] Continuous Blood Gluc Receiver (DEXCOM G6 RECEIVER) DEVI Use device to check glucose continuously. (Patient not taking: Reported on 08/27/2021)   [DISCONTINUED] Continuous Blood Gluc Sensor (DEXCOM G6 SENSOR) MISC Change sensor every 10 days as directed (Patient not taking: Reported on 08/27/2021)   [DISCONTINUED] Continuous Blood Gluc Transmit (DEXCOM G6 TRANSMITTER) MISC Change transmitter every 90 days as directed. (Patient not taking: Reported on 08/27/2021)   [DISCONTINUED] insulin detemir (LEVEMIR FLEXTOUCH) 100 UNIT/ML FlexPen Inject 20 Units into the skin at bedtime.   [DISCONTINUED] Insulin Pen Needle (RELION PEN NEEDLES) 32G X 4 MM MISC Use three times daily with injections   [DISCONTINUED] NOVOLOG FLEXPEN 100 UNIT/ML FlexPen Inject 12-18 Units into the skin 3 (three) times daily with meals. INJECT 18 TO 24 UNITS SUBCUTANEOUSLY THREE TIMES DAILY WITH  MEALS   No facility-administered encounter medications on file as of 08/27/2021.   ALLERGIES: No Known Allergies VACCINATION STATUS:  There is no immunization history on file for this patient.  Diabetes She presents for her follow-up diabetic visit. She has type 1 (She is administratively classified as type 1 diabetes for management purposes.) diabetes mellitus. Onset time: She was diagnosed at approximate age of 39 years. Her disease course has been  worsening. There are no hypoglycemic associated symptoms. Pertinent negatives for hypoglycemia include no confusion, headaches, pallor or seizures. Associated symptoms include blurred vision, fatigue, polydipsia and polyuria. Pertinent negatives for diabetes include no chest pain, no foot paresthesias, no polyphagia and no weight loss. There are no hypoglycemic complications. Symptoms are improving. Diabetic complications include nephropathy and peripheral neuropathy. Risk factors for coronary artery disease include diabetes mellitus, sedentary lifestyle, hypertension, dyslipidemia and stress. Current diabetic treatment includes intensive insulin program. She is compliant with treatment some of the time (has been out of her Levemir for about a week or so). Her weight is fluctuating minimally. She is following a generally unhealthy diet. When asked about meal planning, she reported none. She has had a previous visit with a dietitian. She never participates in exercise. Her home blood glucose trend is fluctuating dramatically. Her overall blood glucose range is >200 mg/dl. (She presents today with her logs, no meter, showing gross hyperglycemia overall.  Her POCT A1c today is 12%, increasing from last visit of 11.3%.  She has been out of her Levemir for about a week now, never called to have it refilled.  She also misses some opportunities to inject insulin due to not checking glucose.  Says she has been stretching out her strips.  ) An ACE inhibitor/angiotensin II receptor blocker is being taken. She does not see a podiatrist.Eye exam is current.  Hypertension This is a chronic problem. The current episode started more than 1 year ago. The problem is unchanged. The problem is uncontrolled. Associated symptoms include blurred vision. Pertinent negatives include no chest pain, headaches, palpitations or shortness of breath. There are no associated agents to hypertension. Risk factors for coronary artery disease  include diabetes mellitus, dyslipidemia, sedentary lifestyle and post-menopausal state. Past treatments include ACE inhibitors and diuretics. The current treatment provides mild improvement. Compliance problems include diet, exercise and psychosocial issues.  Hypertensive end-organ damage includes kidney disease. Identifiable causes of hypertension include chronic renal disease.    Review of systems  Constitutional: + minimally fluctuating body weight,  current Body mass index is 30.27 kg/m. , no fatigue, no subjective hyperthermia, no subjective hypothermia Eyes: no blurry vision, no xerophthalmia ENT: no sore throat, no nodules palpated in throat, no dysphagia/odynophagia, no hoarseness Cardiovascular: no chest pain, no shortness of breath, no palpitations, no leg swelling Respiratory: no cough, no shortness of breath Gastrointestinal: no nausea/vomiting/diarrhea Musculoskeletal: no muscle/joint aches, walks with cane Skin: no rashes, no hyperemia Neurological: no tremors, no dizziness, intermittent numbness/tingling to bilateral feet Psychiatric: no depression, no anxiety  Objective:    BP (!) 156/81   Pulse 72   Ht 5' (1.524 m)   Wt 155 lb (70.3 kg)   BMI 30.27 kg/m   Wt Readings from Last 3 Encounters:  08/27/21 155 lb (70.3 kg)  05/01/21 157 lb 3.2 oz (71.3 kg)  12/30/20 155 lb 6.4 oz (70.5 kg)    BP Readings from Last 3 Encounters:  08/27/21 (!) 156/81  05/01/21 (!) 167/97  12/30/20 (!) 177/98    Physical Exam- Limited  Constitutional:  Body mass index is 30.27 kg/m. , not in acute distress, normal state of mind Eyes:  EOMI, no exophthalmos Neck: Supple Cardiovascular: RRR, no murmurs, rubs, or gallops, no edema Respiratory: Adequate breathing efforts, no crackles, rales, rhonchi, or wheezing Musculoskeletal: no gross deformities, strength intact in all four extremities, no gross restriction of joint movements, walks with cane Skin:  no rashes, no  hyperemia Neurological: no tremor with outstretched hands  Diabetic Foot Exam - Simple   No data filed    Recent Results (from the past 2160 hour(s))  Comprehensive metabolic panel     Status: Abnormal   Collection Time: 07/23/21  8:37 AM  Result Value Ref Range   Glucose 210 (H) 70 - 99 mg/dL   BUN 24 6 - 24 mg/dL   Creatinine, Ser 1.59 (H) 0.57 - 1.00 mg/dL   eGFR 37 (L) >59 mL/min/1.73   BUN/Creatinine Ratio 15 9 - 23   Sodium 140 134 - 144 mmol/L   Potassium 4.4 3.5 - 5.2 mmol/L   Chloride 105 96 - 106 mmol/L   CO2 22 20 - 29 mmol/L   Calcium 8.8 8.7 - 10.2 mg/dL   Total Protein 7.0 6.0 - 8.5 g/dL   Albumin 3.8 3.8 - 4.9 g/dL    Comment:               **Please note reference interval change**   Globulin, Total 3.2 1.5 - 4.5 g/dL   Albumin/Globulin Ratio 1.2 1.2 - 2.2   Bilirubin Total <0.2 0.0 - 1.2 mg/dL   Alkaline Phosphatase 115 44 - 121 IU/L   AST 16 0 - 40 IU/L   ALT 9 0 - 32 IU/L  HgB A1c     Status: Abnormal   Collection Time: 08/27/21  9:26 AM  Result Value Ref Range   Hemoglobin A1C     HbA1c POC (<> result, manual entry) 12 4.0 - 5.6 %   HbA1c, POC (prediabetic range)     HbA1c, POC (controlled diabetic range)       Assessment & Plan:    1) Uncontrolled type 1 diabetes mellitus-chronically uncontrolled  - Patient has currently uncontrolled symptomatic type 1 DM (administrative reclassified as type I for management purposes) since  61 years of age.  She presents today with her logs, no meter, showing gross hyperglycemia overall.  Her POCT A1c today is 12%, increasing from last visit of 11.3%.  She has been out of her Levemir for about a week now, never called to have it refilled.  She also misses some opportunities to inject insulin due to not checking glucose.  Says she has been stretching out her strips.     - Her diabetes is complicated by neuropathy, patient remains at a high risk for more acute and chronic complications of diabetes which include CAD,  CVA, CKD, retinopathy, and neuropathy. These are all discussed in detail with the patient.  Marland Kitchenditewr  -She has seen Jearld Fenton, RDE in the past for individualized education, saw her today for follow up.  - I have approached patient with the following individualized plan to manage diabetes and patient agrees:   -She will continue to need intensive treatment with higher dose of basal/bolus insulin in order for her to achieve and maintain control of diabetes to target.   -She is advised to restart her Levemir at 25 units SQ nightly  (sample provided for [place] to get her started) and continue her current Novolog dose to 12-18 units  TID with meals if glucose is above 90 and she is eating (Specific instructions on how to titrate insulin dosage based on glucose readings given to patient in writing).        -She is encouraged start consistently monitoring blood glucose 4 times daily, before meals and at bedtime and report to the clinic if blood glucose levels are less than 70 or greater than 300 for 3 tests in a row. She did not like her CGM.  - She is not a candidate for metformin, SGLT2 inhibitors, nor incretin therapy.  - Patient specific target  A1c;  LDL, HDL, Triglycerides, and  Waist Circumference were discussed in detail.  2) BP/HTN:  Her blood pressure is not controlled to target.  She reports she has been taking her BP meds as prescribed.  She is advised to continue Lisinopril-HCT 10-12.5 mg po daily.  I referred her to nephrology at last visit but she did not follow through.  3) Lipids/HPL: Her recent lipid panel from 12/11/20 shows uncontrolled LDL at 135 and triglycerides of 314 (worsening).  She is advised to continue Lipitor 10 mg po daily at bedtime.  Side effects and precautions discussed with her.   4)  Weight/Diet: Her Body mass index is 30.27 kg/m.   Further weight gain not recommended for her.  2 years ago, she did have significant weight deficit.  CDE Consult in progress ,  exercise, and detailed carbohydrates information provided.  Side effects and precautions discussed with her.  5) Vitamin D deficiency.   Her recent vitamin D level was 14.5 on 12/11/20.  She is currently only taking MVI.  I discussed and initiated replenishment with Ergocalciferol 50000 units PO weekly x 12 weeks.  6) Chronic Care/Health Maintenance: -Patient is on ACEI/ARB medications and Statin medication and encouraged to continue to follow up with Ophthalmology, Podiatrist at least yearly or according to recommendations, and advised to   stay away from smoking. I have recommended yearly flu vaccine and pneumonia vaccination at least every 5 years; moderate intensity exercise for up to 150 minutes weekly; and  sleep for at least 7 hours a day.    - I advised patient to maintain close follow up with House, Deliah Goody, FNP for primary care needs.     I spent 43 minutes in the care of the patient today including review of labs from [place], Lipids, Thyroid Function, Hematology (current and previous including abstractions from other facilities); face-to-face time discussing  her blood glucose readings/logs, discussing hypoglycemia and hyperglycemia episodes and symptoms, medications doses, her options of short and long term treatment based on the latest standards of care / guidelines;  discussion about incorporating lifestyle medicine;  and documenting the encounter. Risk reduction counseling performed per USPSTF guidelines to reduce obesity and cardiovascular risk factors.     Please refer to Patient Instructions for Blood Glucose Monitoring and Insulin/Medications Dosing Guide"  in media tab for additional information. Please  also refer to " Patient Self Inventory" in the Media  tab for reviewed elements of pertinent patient history.  [name] participated in the discussions, expressed understanding, and voiced agreement with the above plans.  All questions were answered to her satisfaction. she  is encouraged to contact clinic should she have any questions or concerns prior to her return visit.  Follow up plan: - Return in about 3 months (around 11/27/2021) for Diabetes F/U with A1c in office, No previsit labs, Bring meter and logs.  Rayetta Pigg, FNP-BC [facility]  [address] [place], [place] [address] Phone: [phone] Fax: [phone]   08/27/2021, 9:39 AM

## 2021-11-27 ENCOUNTER — Ambulatory Visit: Payer: Medicaid Other | Admitting: Nurse Practitioner

## 2022-01-14 ENCOUNTER — Encounter: Payer: Self-pay | Admitting: Nurse Practitioner

## 2022-01-14 ENCOUNTER — Ambulatory Visit (INDEPENDENT_AMBULATORY_CARE_PROVIDER_SITE_OTHER): Payer: Medicaid Other | Admitting: Nurse Practitioner

## 2022-01-14 VITALS — BP 168/98 | HR 84 | Ht 60.0 in | Wt 153.2 lb

## 2022-01-14 DIAGNOSIS — E782 Mixed hyperlipidemia: Secondary | ICD-10-CM | POA: Diagnosis not present

## 2022-01-14 DIAGNOSIS — E559 Vitamin D deficiency, unspecified: Secondary | ICD-10-CM | POA: Diagnosis not present

## 2022-01-14 DIAGNOSIS — E1065 Type 1 diabetes mellitus with hyperglycemia: Secondary | ICD-10-CM

## 2022-01-14 DIAGNOSIS — N183 Chronic kidney disease, stage 3 unspecified: Secondary | ICD-10-CM

## 2022-01-14 DIAGNOSIS — E1022 Type 1 diabetes mellitus with diabetic chronic kidney disease: Secondary | ICD-10-CM

## 2022-01-14 DIAGNOSIS — I10 Essential (primary) hypertension: Secondary | ICD-10-CM | POA: Diagnosis not present

## 2022-01-14 LAB — POCT GLYCOSYLATED HEMOGLOBIN (HGB A1C): Hemoglobin A1C: 12.3 % — AB (ref 4.0–5.6)

## 2022-01-14 MED ORDER — ATORVASTATIN CALCIUM 10 MG PO TABS: 10.0000 mg | ORAL_TABLET | Freq: Every day | ORAL | 3 refills | Status: DC

## 2022-01-14 MED ORDER — LEVEMIR FLEXTOUCH 100 UNIT/ML ~~LOC~~ SOPN: 28.0000 [IU] | PEN_INJECTOR | Freq: Every day | SUBCUTANEOUS | 3 refills | Status: DC

## 2022-01-14 MED ORDER — LISINOPRIL-HYDROCHLOROTHIAZIDE 10-12.5 MG PO TABS: 1.0000 | ORAL_TABLET | Freq: Every day | ORAL | 3 refills | Status: DC

## 2022-01-14 MED ORDER — ACCU-CHEK GUIDE W/DEVICE KIT: PACK | 0 refills | Status: AC

## 2022-01-14 NOTE — Progress Notes (Signed)
01/14/2022  Endocrinology follow-up note    Subjective:    Patient ID: Katherine Patton, female    DOB: 07/08/1960. Patient is being seen in follow-up for management of chronically uncontrolled diabetes . PCP: Bradd Burner, FNP.  Past Medical History:  Diagnosis Date   Diabetes mellitus without complication ([place])    Uterine mass    Past Surgical History:  Procedure Laterality Date   CATARACT EXTRACTION     Social History   Socioeconomic History   Marital status: Single    Spouse name: Not on file   Number of children: Not on file   Years of education: Not on file   Highest education level: Not on file  Occupational History   Not on file  Tobacco Use   Smoking status: Never   Smokeless tobacco: Never  Vaping Use   Vaping Use: Never used  Substance and Sexual Activity   Alcohol use: No   Drug use: No   Sexual activity: Not Currently    Birth control/protection: None  Other Topics Concern   Not on file  Social History Narrative   Not on file   Social Determinants of Health   Financial Resource Strain: Not on file  Food Insecurity: Not on file  Transportation Needs: Not on file  Physical Activity: Not on file  Stress: Not on file  Social Connections: Not on file   Outpatient Encounter Medications as of 01/14/2022  Medication Sig   acetaminophen (TYLENOL) 500 MG tablet Take 500 mg by mouth every 6 (six) hours as needed for headache.   Blood Glucose Monitoring Suppl (ACCU-CHEK GUIDE) w/Device KIT Use to monitor glucose 4 times daily.   Cholecalciferol 2000 units CAPS Take 1 capsule (2,000 Units total) by mouth daily with breakfast.   glucose blood (ACCU-CHEK GUIDE) test strip Use as instructed to monitor glucose 4 times daily   Insulin Pen Needle (RELION PEN NEEDLES) 32G X 4 MM MISC Use three times daily with injections   Multiple Vitamins-Minerals (MULTIVITAMIN ADULTS 50+) TABS Take 1 tablet by mouth daily in the afternoon.   NOVOLOG FLEXPEN 100 UNIT/ML FlexPen  Inject 12-18 Units into the skin 3 (three) times daily with meals. INJECT 18 TO 24 UNITS SUBCUTANEOUSLY THREE TIMES DAILY WITH MEALS   [DISCONTINUED] atorvastatin (LIPITOR) 10 MG tablet Take 1 tablet (10 mg total) by mouth daily.   [DISCONTINUED] insulin detemir (LEVEMIR FLEXTOUCH) 100 UNIT/ML FlexPen Inject 25 Units into the skin at bedtime.   [DISCONTINUED] lisinopril-hydrochlorothiazide (ZESTORETIC) 10-12.5 MG tablet Take 1 tablet by mouth daily.   atorvastatin (LIPITOR) 10 MG tablet Take 1 tablet (10 mg total) by mouth daily.   insulin detemir (LEVEMIR FLEXTOUCH) 100 UNIT/ML FlexPen Inject 28 Units into the skin at bedtime.   lisinopril-hydrochlorothiazide (ZESTORETIC) 10-12.5 MG tablet Take 1 tablet by mouth daily.   Vitamin D, Ergocalciferol, (DRISDOL) 1.25 MG (50000 UNIT) CAPS capsule Take 1 capsule (50,000 Units total) by mouth every 7 (seven) days. (Patient not taking: Reported on 08/27/2021)   No facility-administered encounter medications on file as of 01/14/2022.   ALLERGIES: No Known Allergies VACCINATION STATUS:  There is no immunization history on file for this patient.  Diabetes She presents for her follow-up diabetic visit. She has type 1 (She is administratively classified as type 1 diabetes for management purposes.) diabetes mellitus. Onset time: She was diagnosed at approximate age of 53 years. Her disease course has been worsening. Hypoglycemia symptoms include sweats and tremors. Pertinent negatives for hypoglycemia include no confusion, headaches, pallor or  seizures. Associated symptoms include blurred vision, fatigue, polydipsia, polyuria and weight loss. Pertinent negatives for diabetes include no chest pain, no foot paresthesias and no polyphagia. There are no hypoglycemic complications. Symptoms are improving. Diabetic complications include nephropathy and peripheral neuropathy. Risk factors for coronary artery disease include diabetes mellitus, sedentary lifestyle,  hypertension, dyslipidemia and stress. Current diabetic treatment includes intensive insulin program. She is compliant with treatment some of the time. Her weight is decreasing steadily. She is following a generally unhealthy diet. When asked about meal planning, she reported none. She has had a previous visit with a dietitian. She never participates in exercise. Her home blood glucose trend is fluctuating dramatically. Her overall blood glucose range is >200 mg/dl. (She presents today with her logs, no meter, showing gross hyperglycemia overall.  Her POCT A1c today is 12.3% worsening from last visit.  Analysis of her meter shows 7-day average of 312, 14-day average of 283, 30-day average of 298, 90-day average of 294.  She notes she still misses several doses of insulin per week.  She did have random hypoglycemia noted, says likely due to meal timing.) An ACE inhibitor/angiotensin II receptor blocker is being taken. She does not see a podiatrist.Eye exam is current.  Hypertension This is a chronic problem. The current episode started more than 1 year ago. The problem is unchanged. The problem is uncontrolled. Associated symptoms include blurred vision and sweats. Pertinent negatives include no chest pain, headaches, palpitations or shortness of breath. There are no associated agents to hypertension. Risk factors for coronary artery disease include diabetes mellitus, dyslipidemia, sedentary lifestyle and post-menopausal state. Past treatments include ACE inhibitors and diuretics. The current treatment provides mild improvement. Compliance problems include diet, exercise and psychosocial issues.  Hypertensive end-organ damage includes kidney disease. Identifiable causes of hypertension include chronic renal disease.    Review of systems  Constitutional: + minimally fluctuating body weight,  current Body mass index is 29.92 kg/m. , no fatigue, no subjective hyperthermia, no subjective hypothermia Eyes: no  blurry vision, no xerophthalmia ENT: no sore throat, no nodules palpated in throat, no dysphagia/odynophagia, no hoarseness Cardiovascular: no chest pain, no shortness of breath, no palpitations, no leg swelling Respiratory: no cough, no shortness of breath Gastrointestinal: no nausea/vomiting/diarrhea Musculoskeletal: no muscle/joint aches, walks with cane Skin: no rashes, no hyperemia Neurological: no tremors, no dizziness, intermittent numbness/tingling to bilateral feet Psychiatric: no depression, no anxiety  Objective:    BP (!) 168/98 (BP Location: Left Arm, Patient Position: Sitting, Cuff Size: Normal)   Pulse 84   Ht 5' (1.524 m)   Wt 153 lb 3.2 oz (69.5 kg)   BMI 29.92 kg/m   Wt Readings from Last 3 Encounters:  01/14/22 153 lb 3.2 oz (69.5 kg)  08/27/21 155 lb (70.3 kg)  05/01/21 157 lb 3.2 oz (71.3 kg)    BP Readings from Last 3 Encounters:  01/14/22 (!) 168/98  08/27/21 (!) 156/81  05/01/21 (!) 167/97    Physical Exam- Limited  Constitutional:  Body mass index is 29.92 kg/m. , not in acute distress, normal state of mind Eyes:  EOMI, no exophthalmos Musculoskeletal: no gross deformities, strength intact in all four extremities, no gross restriction of joint movements, walks with cane Skin:  no rashes, no hyperemia Neurological: no tremor with outstretched hands  Diabetic Foot Exam - Simple   No data filed    Recent Results (from the past 2160 hour(s))  HgB A1c     Status: Abnormal   Collection Time: 01/14/22  9:25 AM  Result Value Ref Range   Hemoglobin A1C 12.3 (A) 4.0 - 5.6 %   HbA1c POC (<> result, manual entry)     HbA1c, POC (prediabetic range)     HbA1c, POC (controlled diabetic range)       Assessment & Plan:    1) Uncontrolled type 1 diabetes mellitus-chronically uncontrolled  - Patient has currently uncontrolled symptomatic type 1 DM (administrative reclassified as type I for management purposes) since  62 years of age.  She presents  today with her logs, no meter, showing gross hyperglycemia overall.  Her POCT A1c today is 12.3% worsening from last visit.  Analysis of her meter shows 7-day average of 312, 14-day average of 283, 30-day average of 298, 90-day average of 294.  She notes she still misses several doses of insulin per week.  She did have random hypoglycemia noted, says likely due to meal timing.   - Her diabetes is complicated by neuropathy, patient remains at a high risk for more acute and chronic complications of diabetes which include CAD, CVA, CKD, retinopathy, and neuropathy. These are all discussed in detail with the patient.  - Nutritional counseling repeated at each appointment due to patients tendency to fall back in to old habits.  - The patient admits there is a room for improvement in their diet and drink choices. -  Suggestion is made for the patient to avoid simple carbohydrates from their diet including Cakes, Sweet Desserts / Pastries, Ice Cream, Soda (diet and regular), Sweet Tea, Candies, Chips, Cookies, Sweet Pastries, Store Bought Juices, Alcohol in Excess of 1-2 drinks a day, Artificial Sweeteners, Coffee Creamer, and "Sugar-free" Products. This will help patient to have stable blood glucose profile and potentially avoid unintended weight gain.   - I encouraged the patient to switch to unprocessed or minimally processed complex starch and increased protein intake (animal or plant source), fruits, and vegetables.   - Patient is advised to stick to a routine mealtimes to eat 3 meals a day and avoid unnecessary snacks (to snack only to correct hypoglycemia).  -She has seen Jearld Fenton, RDE in the past for individualized education, saw her today for follow up.  - I have approached patient with the following individualized plan to manage diabetes and patient agrees:   -She will continue to need intensive treatment with higher dose of basal/bolus insulin in order for her to achieve and maintain  control of diabetes to target.   -She is advised to increase her Levemir to 28 units SQ nightly and continue her current Novolog dose to 12-18 units TID with meals if glucose is above 90 and she is eating (Specific instructions on how to titrate insulin dosage based on glucose readings given to patient in writing).   She is advised to be consistent with her medications.     -She is encouraged start consistently monitoring blood glucose 4 times daily, before meals and at bedtime and report to the clinic if blood glucose levels are less than 70 or greater than 300 for 3 tests in a row. She did not like her CGM (alarmed too much for her).  - She is not a candidate for metformin, SGLT2 inhibitors, nor incretin therapy.  - Patient specific target  A1c;  LDL, HDL, Triglycerides, and  Waist Circumference were discussed in detail.  2) BP/HTN:  Her blood pressure is not controlled to target, says she has run out of medication.   She is advised to continue Lisinopril-HCT 10-12.5 mg  po daily- sent in new script for her today.  I referred her to nephrology at last visit but she did not follow through.  3) Lipids/HPL: Her recent lipid panel from 12/11/20 shows uncontrolled LDL at 135 and triglycerides of 314 (worsening).  She is advised to continue Lipitor 10 mg po daily at bedtime.  Side effects and precautions discussed with her.  Will recheck prior to next visit.   4)  Weight/Diet: Her Body mass index is 29.92 kg/m.   Further weight gain not recommended for her.  2 years ago, she did have significant weight deficit.  CDE Consult in progress , exercise, and detailed carbohydrates information provided.  Side effects and precautions discussed with her.  5) Vitamin D deficiency.   Her recent vitamin D level was 14.5 on 12/11/20.  She is not currently taking any supplementation.  Will recheck vitamin d prior to next visit.  6) Chronic Care/Health Maintenance: -Patient is on ACEI/ARB medications and Statin  medication and encouraged to continue to follow up with Ophthalmology, Podiatrist at least yearly or according to recommendations, and advised to   stay away from smoking. I have recommended yearly flu vaccine and pneumonia vaccination at least every 5 years; moderate intensity exercise for up to 150 minutes weekly; and  sleep for at least 7 hours a day.    - I advised patient to maintain close follow up with House, Deliah Goody, FNP for primary care needs.     I spent 30 minutes in the care of the patient today including review of labs from [facility], Lipids, Thyroid Function, Hematology (current and previous including abstractions from other facilities); face-to-face time discussing  her blood glucose readings/logs, discussing hypoglycemia and hyperglycemia episodes and symptoms, medications doses, her options of short and long term treatment based on the latest standards of care / guidelines;  discussion about incorporating lifestyle medicine;  and documenting the encounter. Risk reduction counseling performed per USPSTF guidelines to reduce obesity and cardiovascular risk factors.     Please refer to Patient Instructions for Blood Glucose Monitoring and Insulin/Medications Dosing Guide"  in media tab for additional information. Please  also refer to " Patient Self Inventory" in the Media  tab for reviewed elements of pertinent patient history.  [name] participated in the discussions, expressed understanding, and voiced agreement with the above plans.  All questions were answered to her satisfaction. she is encouraged to contact clinic should she have any questions or concerns prior to her return visit.  Follow up plan: - Return in about 3 months (around 04/15/2022) for Diabetes F/U with A1c in office, Bring meter and logs, Previsit labs.  Rayetta Pigg, [facility] [address] [place], [place] [address] Phone: [phone] Fax: [phone]   01/14/2022,  9:38 AM

## 2022-04-16 ENCOUNTER — Ambulatory Visit: Payer: Medicaid Other | Admitting: Nurse Practitioner

## 2022-04-16 DIAGNOSIS — E1065 Type 1 diabetes mellitus with hyperglycemia: Secondary | ICD-10-CM

## 2022-04-16 DIAGNOSIS — E559 Vitamin D deficiency, unspecified: Secondary | ICD-10-CM

## 2022-04-16 DIAGNOSIS — E782 Mixed hyperlipidemia: Secondary | ICD-10-CM

## 2022-04-16 DIAGNOSIS — N183 Chronic kidney disease, stage 3 unspecified: Secondary | ICD-10-CM

## 2022-04-16 DIAGNOSIS — I10 Essential (primary) hypertension: Secondary | ICD-10-CM

## 2022-05-15 LAB — COMPREHENSIVE METABOLIC PANEL
ALT: 14 IU/L (ref 0–32)
AST: 26 IU/L (ref 0–40)
Albumin/Globulin Ratio: 1.2 (ref 1.2–2.2)
Albumin: 3.9 g/dL (ref 3.8–4.9)
Alkaline Phosphatase: 142 IU/L — ABNORMAL HIGH (ref 44–121)
BUN/Creatinine Ratio: 20 (ref 9–23)
BUN: 35 mg/dL — ABNORMAL HIGH (ref 6–24)
Bilirubin Total: 0.3 mg/dL (ref 0.0–1.2)
CO2: 23 mmol/L (ref 20–29)
Calcium: 9 mg/dL (ref 8.7–10.2)
Chloride: 100 mmol/L (ref 96–106)
Creatinine, Ser: 1.72 mg/dL — ABNORMAL HIGH (ref 0.57–1.00)
Globulin, Total: 3.2 g/dL (ref 1.5–4.5)
Glucose: 367 mg/dL — ABNORMAL HIGH (ref 70–99)
Potassium: 5.2 mmol/L (ref 3.5–5.2)
Sodium: 139 mmol/L (ref 134–144)
Total Protein: 7.1 g/dL (ref 6.0–8.5)
eGFR: 34 mL/min/{1.73_m2} — ABNORMAL LOW (ref >59)

## 2022-05-15 LAB — LIPID PANEL
Chol/HDL Ratio: 3.6 ratio (ref 0.0–4.4)
Cholesterol, Total: 196 mg/dL (ref 100–199)
HDL: 54 mg/dL (ref >39)
LDL Chol Calc (NIH): 120 mg/dL — ABNORMAL HIGH (ref 0–99)
Triglycerides: 123 mg/dL (ref 0–149)
VLDL Cholesterol Cal: 22 mg/dL (ref 5–40)

## 2022-05-15 LAB — TSH: TSH: 2.93 u[IU]/mL (ref 0.450–4.500)

## 2022-05-15 LAB — VITAMIN D 25 HYDROXY (VIT D DEFICIENCY, FRACTURES): Vit D, 25-Hydroxy: 11.1 ng/mL — ABNORMAL LOW (ref 30.0–100.0)

## 2022-05-15 LAB — T4, FREE: Free T4: 1.61 ng/dL (ref 0.82–1.77)

## 2022-05-20 ENCOUNTER — Ambulatory Visit (INDEPENDENT_AMBULATORY_CARE_PROVIDER_SITE_OTHER): Payer: Medicaid Other | Admitting: Nurse Practitioner

## 2022-05-20 ENCOUNTER — Encounter: Payer: Self-pay | Admitting: Nurse Practitioner

## 2022-05-20 VITALS — BP 138/70 | HR 102 | Ht 60.0 in | Wt 151.4 lb

## 2022-05-20 DIAGNOSIS — E559 Vitamin D deficiency, unspecified: Secondary | ICD-10-CM | POA: Diagnosis not present

## 2022-05-20 DIAGNOSIS — I10 Essential (primary) hypertension: Secondary | ICD-10-CM | POA: Diagnosis not present

## 2022-05-20 DIAGNOSIS — N183 Chronic kidney disease, stage 3 unspecified: Secondary | ICD-10-CM

## 2022-05-20 DIAGNOSIS — E782 Mixed hyperlipidemia: Secondary | ICD-10-CM | POA: Diagnosis not present

## 2022-05-20 DIAGNOSIS — E1065 Type 1 diabetes mellitus with hyperglycemia: Secondary | ICD-10-CM

## 2022-05-20 DIAGNOSIS — E1022 Type 1 diabetes mellitus with diabetic chronic kidney disease: Secondary | ICD-10-CM

## 2022-05-20 LAB — POCT GLYCOSYLATED HEMOGLOBIN (HGB A1C): Hemoglobin A1C: 11.5 % — AB (ref 4.0–5.6)

## 2022-05-20 MED ORDER — RELION PEN NEEDLES 32G X 4 MM MISC: 3 refills | Status: DC

## 2022-05-20 MED ORDER — VITAMIN D (ERGOCALCIFEROL) 1.25 MG (50000 UNIT) PO CAPS: 50000.0000 [IU] | ORAL_CAPSULE | ORAL | 0 refills | Status: AC

## 2022-05-20 MED ORDER — ATORVASTATIN CALCIUM 10 MG PO TABS: 10.0000 mg | ORAL_TABLET | Freq: Every day | ORAL | 3 refills | Status: DC

## 2022-05-20 MED ORDER — LEVEMIR FLEXTOUCH 100 UNIT/ML ~~LOC~~ SOPN: 28.0000 [IU] | PEN_INJECTOR | Freq: Every day | SUBCUTANEOUS | 3 refills | Status: DC

## 2022-05-20 MED ORDER — NOVOLOG FLEXPEN 100 UNIT/ML ~~LOC~~ SOPN
8.0000 [IU] | PEN_INJECTOR | Freq: Three times a day (TID) | SUBCUTANEOUS | 3 refills | Status: DC
Start: 2022-05-20 — End: 2022-08-20

## 2022-05-20 NOTE — Progress Notes (Signed)
05/20/2022  Endocrinology follow-up note    Subjective:    Patient ID: Katherine Patton, female    DOB: 1960/03/21. Patient is being seen in follow-up for management of chronically uncontrolled diabetes . PCP: Colvin Caroli, FNP.  Past Medical History:  Diagnosis Date   Diabetes mellitus without complication (HCC)    Uterine mass    Past Surgical History:  Procedure Laterality Date   CATARACT EXTRACTION     Social History   Socioeconomic History   Marital status: Single    Spouse name: Not on file   Number of children: Not on file   Years of education: Not on file   Highest education level: Not on file  Occupational History   Not on file  Tobacco Use   Smoking status: Never   Smokeless tobacco: Never  Vaping Use   Vaping Use: Never used  Substance and Sexual Activity   Alcohol use: No   Drug use: No   Sexual activity: Not Currently    Birth control/protection: None  Other Topics Concern   Not on file  Social History Narrative   Not on file   Social Determinants of Health   Financial Resource Strain: Not on file  Food Insecurity: Not on file  Transportation Needs: Not on file  Physical Activity: Not on file  Stress: Not on file  Social Connections: Not on file   Outpatient Encounter Medications as of 05/20/2022  Medication Sig   acetaminophen (TYLENOL) 500 MG tablet Take 500 mg by mouth every 6 (six) hours as needed for headache.   Blood Glucose Monitoring Suppl (ACCU-CHEK GUIDE) w/Device KIT Use to monitor glucose 4 times daily.   Cholecalciferol 2000 units CAPS Take 1 capsule (2,000 Units total) by mouth daily with breakfast.   glucose blood (ACCU-CHEK GUIDE) test strip Use as instructed to monitor glucose 4 times daily   lisinopril-hydrochlorothiazide (ZESTORETIC) 10-12.5 MG tablet Take 1 tablet by mouth daily.   Multiple Vitamins-Minerals (MULTIVITAMIN ADULTS 50+) TABS Take 1 tablet by mouth daily in the afternoon.   [DISCONTINUED] atorvastatin (LIPITOR) 10  MG tablet Take 1 tablet (10 mg total) by mouth daily.   [DISCONTINUED] insulin detemir (LEVEMIR FLEXTOUCH) 100 UNIT/ML FlexPen Inject 28 Units into the skin at bedtime.   [DISCONTINUED] Insulin Pen Needle (RELION PEN NEEDLES) 32G X 4 MM MISC Use three times daily with injections   [DISCONTINUED] NOVOLOG FLEXPEN 100 UNIT/ML FlexPen Inject 12-18 Units into the skin 3 (three) times daily with meals. INJECT 18 TO 24 UNITS SUBCUTANEOUSLY THREE TIMES DAILY WITH MEALS   atorvastatin (LIPITOR) 10 MG tablet Take 1 tablet (10 mg total) by mouth daily.   insulin detemir (LEVEMIR FLEXTOUCH) 100 UNIT/ML FlexPen Inject 28 Units into the skin at bedtime.   Insulin Pen Needle (RELION PEN NEEDLES) 32G X 4 MM MISC Use three times daily with injections   NOVOLOG FLEXPEN 100 UNIT/ML FlexPen Inject 8-14 Units into the skin 3 (three) times daily with meals.   Vitamin D, Ergocalciferol, (DRISDOL) 1.25 MG (50000 UNIT) CAPS capsule Take 1 capsule (50,000 Units total) by mouth every 7 (seven) days.   [DISCONTINUED] Vitamin D, Ergocalciferol, (DRISDOL) 1.25 MG (50000 UNIT) CAPS capsule Take 1 capsule (50,000 Units total) by mouth every 7 (seven) days. (Patient not taking: Reported on 05/20/2022)   No facility-administered encounter medications on file as of 05/20/2022.   ALLERGIES: No Known Allergies VACCINATION STATUS:  There is no immunization history on file for this patient.  Diabetes She presents for her follow-up diabetic  visit. She has type 1 (She is administratively classified as type 1 diabetes for management purposes.) diabetes mellitus. Onset time: She was diagnosed at approximate age of 42 years. Her disease course has been fluctuating. Hypoglycemia symptoms include sweats and tremors. Pertinent negatives for hypoglycemia include no confusion, headaches, pallor or seizures. Associated symptoms include blurred vision, fatigue, polydipsia, polyuria and weight loss. Pertinent negatives for diabetes include no chest  pain, no foot paresthesias and no polyphagia. There are no hypoglycemic complications. Symptoms are improving. Diabetic complications include nephropathy and peripheral neuropathy. Risk factors for coronary artery disease include diabetes mellitus, sedentary lifestyle, hypertension, dyslipidemia and stress. Current diabetic treatment includes intensive insulin program. She is compliant with treatment some of the time. Her weight is decreasing steadily. She is following a generally unhealthy diet. When asked about meal planning, she reported none. She has had a previous visit with a dietitian. She never participates in exercise. Her home blood glucose trend is fluctuating dramatically. Her overall blood glucose range is >200 mg/dl. (She presents today with her logs, no meter, showing fluctuating glycemic profile with gross hyperglycemia overall.  Her POCT A1c today is 11.5%, improving from last visit of 12.3%.  She has skipped doses of insulin due to forgetting or falling asleep early.  She notes she does not have symptoms of hyperglycemia, only when it is low does she feel bad.  She has been walking more lately, which may be contributing to some mild hypoglycemia.) An ACE inhibitor/angiotensin II receptor blocker is being taken. She does not see a podiatrist.Eye exam is current.  Hypertension This is a chronic problem. The current episode started more than 1 year ago. The problem is unchanged. The problem is uncontrolled. Associated symptoms include blurred vision and sweats. Pertinent negatives include no chest pain, headaches, palpitations or shortness of breath. There are no associated agents to hypertension. Risk factors for coronary artery disease include diabetes mellitus, dyslipidemia, sedentary lifestyle and post-menopausal state. Past treatments include ACE inhibitors and diuretics. The current treatment provides mild improvement. Compliance problems include diet, exercise and psychosocial issues.   Hypertensive end-organ damage includes kidney disease. Identifiable causes of hypertension include chronic renal disease.    Review of systems  Constitutional: + decreasing body weight,  current Body mass index is 29.57 kg/m. , no fatigue, no subjective hyperthermia, no subjective hypothermia Eyes: no blurry vision, no xerophthalmia ENT: no sore throat, no nodules palpated in throat, no dysphagia/odynophagia, no hoarseness Cardiovascular: no chest pain, no shortness of breath, no palpitations, no leg swelling Respiratory: no cough, no shortness of breath Gastrointestinal: no nausea/vomiting/diarrhea Musculoskeletal: no muscle/joint aches, walks with cane Skin: no rashes, no hyperemia Neurological: no tremors, no dizziness, intermittent numbness/tingling to bilateral feet Psychiatric: no depression, no anxiety  Objective:    BP 138/70 (BP Location: Right Arm, Patient Position: Sitting, Cuff Size: Large)   Pulse (!) 102   Ht 5' (1.524 m)   Wt 151 lb 6.4 oz (68.7 kg)   BMI 29.57 kg/m   Wt Readings from Last 3 Encounters:  05/20/22 151 lb 6.4 oz (68.7 kg)  01/14/22 153 lb 3.2 oz (69.5 kg)  08/27/21 155 lb (70.3 kg)    BP Readings from Last 3 Encounters:  05/20/22 138/70  01/14/22 (!) 168/98  08/27/21 (!) 156/81    Physical Exam- Limited  Constitutional:  Body mass index is 29.57 kg/m. , not in acute distress, normal state of mind Eyes:  EOMI, no exophthalmos Musculoskeletal: no gross deformities, strength intact in all four extremities,  no gross restriction of joint movements, walks with cane Skin:  no rashes, no hyperemia Neurological: no tremor with outstretched hands  Diabetic Foot Exam - Simple   Simple Foot Form Diabetic Foot exam was performed with the following findings: Yes 05/20/2022 10:44 AM  Visual Inspection No deformities, no ulcerations, no other skin breakdown bilaterally: Yes Sensation Testing Intact to touch and monofilament testing bilaterally:  Yes Pulse Check Posterior Tibialis and Dorsalis pulse intact bilaterally: Yes Comments    Recent Results (from the past 2160 hour(s))  Comprehensive metabolic panel     Status: Abnormal   Collection Time: 05/14/22  8:09 AM  Result Value Ref Range   Glucose 367 (H) 70 - 99 mg/dL   BUN 35 (H) 6 - 24 mg/dL   Creatinine, Ser 1.61 (H) 0.57 - 1.00 mg/dL   eGFR 34 (L) >09 UE/AVW/0.98   BUN/Creatinine Ratio 20 9 - 23   Sodium 139 134 - 144 mmol/L   Potassium 5.2 3.5 - 5.2 mmol/L   Chloride 100 96 - 106 mmol/L   CO2 23 20 - 29 mmol/L   Calcium 9.0 8.7 - 10.2 mg/dL   Total Protein 7.1 6.0 - 8.5 g/dL   Albumin 3.9 3.8 - 4.9 g/dL   Globulin, Total 3.2 1.5 - 4.5 g/dL   Albumin/Globulin Ratio 1.2 1.2 - 2.2   Bilirubin Total 0.3 0.0 - 1.2 mg/dL   Alkaline Phosphatase 142 (H) 44 - 121 IU/L   AST 26 0 - 40 IU/L   ALT 14 0 - 32 IU/L  Lipid panel     Status: Abnormal   Collection Time: 05/14/22  8:09 AM  Result Value Ref Range   Cholesterol, Total 196 100 - 199 mg/dL   Triglycerides 119 0 - 149 mg/dL   HDL 54 >14 mg/dL   VLDL Cholesterol Cal 22 5 - 40 mg/dL   LDL Chol Calc (NIH) 782 (H) 0 - 99 mg/dL   Chol/HDL Ratio 3.6 0.0 - 4.4 ratio    Comment:                                   T. Chol/HDL Ratio                                             Men  Women                               1/2 Avg.Risk  3.4    3.3                                   Avg.Risk  5.0    4.4                                2X Avg.Risk  9.6    7.1                                3X Avg.Risk 23.4   11.0   TSH     Status: None   Collection Time: 05/14/22  8:09  AM  Result Value Ref Range   TSH 2.930 0.450 - 4.500 uIU/mL  T4, free     Status: None   Collection Time: 05/14/22  8:09 AM  Result Value Ref Range   Free T4 1.61 0.82 - 1.77 ng/dL  VITAMIN D 25 Hydroxy (Vit-D Deficiency, Fractures)     Status: Abnormal   Collection Time: 05/14/22  8:09 AM  Result Value Ref Range   Vit D, 25-Hydroxy 11.1 (L) 30.0 - 100.0 ng/mL     Comment: Vitamin D deficiency has been defined by the Institute of Medicine and an Endocrine Society practice guideline as a level of serum 25-OH vitamin D less than 20 ng/mL (1,2). The Endocrine Society went on to further define vitamin D insufficiency as a level between 21 and 29 ng/mL (2). 1. IOM (Institute of Medicine). 2010. Dietary reference    intakes for calcium and D. [place]: The    [employer]. 2. Holick MF, Binkley , Bischoff-Ferrari HA, et al.    Evaluation, treatment, and prevention of vitamin D    deficiency: an Endocrine Society clinical practice    guideline. JCEM. 2011 Jul; 96(7):1911-30.   HgB A1c     Status: Abnormal   Collection Time: 05/20/22 10:48 AM  Result Value Ref Range   Hemoglobin A1C 11.5 (A) 4.0 - 5.6 %   HbA1c POC (<> result, manual entry)     HbA1c, POC (prediabetic range)     HbA1c, POC (controlled diabetic range)       Assessment & Plan:   1) Uncontrolled type 1 diabetes mellitus-chronically uncontrolled  - Patient has currently uncontrolled symptomatic type 1 DM (administrative reclassified as type I for management purposes) since  63 years of age.  She presents today with her logs, no meter, showing fluctuating glycemic profile with gross hyperglycemia overall.  Her POCT A1c today is 11.5%, improving from last visit of 12.3%.  She has skipped doses of insulin due to forgetting or falling asleep early.  She notes she does not have symptoms of hyperglycemia, only when it is low does she feel bad.  She has been walking more lately, which may be contributing to some mild hypoglycemia.   - Her diabetes is complicated by neuropathy, patient remains at a high risk for more acute and chronic complications of diabetes which include CAD, CVA, CKD, retinopathy, and neuropathy. These are all discussed in detail with the patient.  - Nutritional counseling repeated at each appointment due to patients tendency to fall back in to old  habits.  - The patient admits there is a room for improvement in their diet and drink choices. -  Suggestion is made for the patient to avoid simple carbohydrates from their diet including Cakes, Sweet Desserts / Pastries, Ice Cream, Soda (diet and regular), Sweet Tea, Candies, Chips, Cookies, Sweet Pastries, Store Bought Juices, Alcohol in Excess of 1-2 drinks a day, Artificial Sweeteners, Coffee Creamer, and "Sugar-free" Products. This will help patient to have stable blood glucose profile and potentially avoid unintended weight gain.   - I encouraged the patient to switch to unprocessed or minimally processed complex starch and increased protein intake (animal or plant source), fruits, and vegetables.   - Patient is advised to stick to a routine mealtimes to eat 3 meals a day and avoid unnecessary snacks (to snack only to correct hypoglycemia).  -She has seen Norm Salt, RDE in the past for individualized education.  - I have approached patient with the following individualized plan  to manage diabetes and patient agrees:   -She will continue to need intensive treatment with higher dose of basal/bolus insulin in order for her to achieve and maintain control of diabetes to target.   -She is advised to continue her Levemir 28 units SQ nightly and lower her Novolog dose to 8-14 units TID with meals if glucose is above 90 and she is eating (Specific instructions on how to titrate insulin dosage based on glucose readings given to patient in writing).   She is advised to be consistent with her medications.     -She is encouraged start consistently monitoring blood glucose 4 times daily, before meals and at bedtime and report to the clinic if blood glucose levels are less than 70 or greater than 300 for 3 tests in a row. She did not like her CGM (alarmed too much for her).  - She is not a candidate for metformin, SGLT2 inhibitors, nor incretin therapy.  - Patient specific target  A1c;  LDL, HDL,  Triglycerides, and  Waist Circumference were discussed in detail.  2) BP/HTN:  Her blood pressure is not controlled to target, says she has run out of medication.   She is advised to continue Lisinopril-HCT 10-12.5 mg po daily- sent in new script for her today.  I referred her to nephrology at last visit but she did not follow through.  I have re-entered the referral today.  3) Lipids/HPL: Her recent lipid panel from 05/14/22 shows uncontrolled LDL at 120.  She notes she has not been taking her Lipitor consistently, I resent in script for her today and advised her to be more consistent with it. She is advised to continue Lipitor 10 mg po daily at bedtime.  Side effects and precautions discussed with her.     4)  Weight/Diet: Her Body mass index is 29.57 kg/m.   Further weight gain not recommended for her.  A few years ago, she did have significant weight deficit.  CDE Consult in progress , exercise, and detailed carbohydrates information provided.  Side effects and precautions discussed with her.  5) Vitamin D deficiency.   Her recent vitamin D level was 11.1 on 05/14/22.  She is not currently taking any supplementation.  I started her on Ergocalciferol 50000 units po weekly.  6) Chronic Care/Health Maintenance: -Patient is on ACEI/ARB medications and Statin medication and encouraged to continue to follow up with Ophthalmology, Podiatrist at least yearly or according to recommendations, and advised to   stay away from smoking. I have recommended yearly flu vaccine and pneumonia vaccination at least every 5 years; moderate intensity exercise for up to 150 minutes weekly; and  sleep for at least 7 hours a day.    - I advised patient to maintain close follow up with House, Eugenio Hoes, FNP for primary care needs.     I spent  30  minutes in the care of the patient today including review of labs from CMP, Lipids, Thyroid Function, Hematology (current and previous including abstractions from other  facilities); face-to-face time discussing  her blood glucose readings/logs, discussing hypoglycemia and hyperglycemia episodes and symptoms, medications doses, her options of short and long term treatment based on the latest standards of care / guidelines;  discussion about incorporating lifestyle medicine;  and documenting the encounter. Risk reduction counseling performed per USPSTF guidelines to reduce obesity and cardiovascular risk factors.     Please refer to Patient Instructions for Blood Glucose Monitoring and Insulin/Medications Dosing Guide"  in media  tab for additional information. Please  also refer to " Patient Self Inventory" in the Media  tab for reviewed elements of pertinent patient history.  Stevphen Rochester participated in the discussions, expressed understanding, and voiced agreement with the above plans.  All questions were answered to her satisfaction. she is encouraged to contact clinic should she have any questions or concerns prior to her return visit.  Follow up plan: - Return in about 3 months (around 08/20/2022) for Diabetes F/U with A1c in office, No previsit labs, Bring meter and logs.  Ronny Bacon, [facility] [address] [place], [place] [address] Phone: [phone] Fax: [phone]   05/20/2022, 10:53 AM

## 2022-08-20 ENCOUNTER — Ambulatory Visit (INDEPENDENT_AMBULATORY_CARE_PROVIDER_SITE_OTHER): Payer: MEDICAID | Admitting: Nurse Practitioner

## 2022-08-20 ENCOUNTER — Encounter: Payer: Self-pay | Admitting: Nurse Practitioner

## 2022-08-20 VITALS — BP 138/70 | HR 94 | Ht 60.0 in | Wt 149.0 lb

## 2022-08-20 DIAGNOSIS — E1065 Type 1 diabetes mellitus with hyperglycemia: Secondary | ICD-10-CM

## 2022-08-20 DIAGNOSIS — E782 Mixed hyperlipidemia: Secondary | ICD-10-CM | POA: Diagnosis not present

## 2022-08-20 DIAGNOSIS — I10 Essential (primary) hypertension: Secondary | ICD-10-CM | POA: Diagnosis not present

## 2022-08-20 DIAGNOSIS — Z794 Long term (current) use of insulin: Secondary | ICD-10-CM

## 2022-08-20 LAB — POCT GLYCOSYLATED HEMOGLOBIN (HGB A1C): Hemoglobin A1C: 11.2 % — AB (ref 4.0–5.6)

## 2022-08-20 MED ORDER — NOVOLOG FLEXPEN 100 UNIT/ML ~~LOC~~ SOPN
8.0000 [IU] | PEN_INJECTOR | Freq: Three times a day (TID) | SUBCUTANEOUS | 3 refills | Status: DC
Start: 2022-08-20 — End: 2022-11-23

## 2022-08-20 NOTE — Progress Notes (Signed)
08/20/2022  Endocrinology follow-up note    Subjective:    Patient ID: Katherine Patton, female    DOB: 11/13/1960. Patient is being seen in follow-up for management of chronically uncontrolled diabetes . PCP: Colvin Caroli, FNP.  Past Medical History:  Diagnosis Date   Diabetes mellitus without complication (HCC)    Uterine mass    Past Surgical History:  Procedure Laterality Date   CATARACT EXTRACTION     Social History   Socioeconomic History   Marital status: Single    Spouse name: Not on file   Number of children: Not on file   Years of education: Not on file   Highest education level: Not on file  Occupational History   Not on file  Tobacco Use   Smoking status: Never   Smokeless tobacco: Never  Vaping Use   Vaping status: Never Used  Substance and Sexual Activity   Alcohol use: No   Drug use: No   Sexual activity: Not Currently    Birth control/protection: None  Other Topics Concern   Not on file  Social History Narrative   Not on file   Social Determinants of Health   Financial Resource Strain: Not on file  Food Insecurity: Not on file  Transportation Needs: Not on file  Physical Activity: Not on file  Stress: Not on file  Social Connections: Not on file   Outpatient Encounter Medications as of 08/20/2022  Medication Sig   acetaminophen (TYLENOL) 500 MG tablet Take 500 mg by mouth every 6 (six) hours as needed for headache.   atorvastatin (LIPITOR) 10 MG tablet Take 1 tablet (10 mg total) by mouth daily.   Blood Glucose Monitoring Suppl (ACCU-CHEK GUIDE) w/Device KIT Use to monitor glucose 4 times daily.   Cholecalciferol 2000 units CAPS Take 1 capsule (2,000 Units total) by mouth daily with breakfast.   glucose blood (ACCU-CHEK GUIDE) test strip Use as instructed to monitor glucose 4 times daily   insulin detemir (LEVEMIR FLEXTOUCH) 100 UNIT/ML FlexPen Inject 28 Units into the skin at bedtime.   Insulin Pen Needle (RELION PEN NEEDLES) 32G X 4 MM MISC  Use three times daily with injections   lisinopril-hydrochlorothiazide (ZESTORETIC) 10-12.5 MG tablet Take 1 tablet by mouth daily.   Multiple Vitamins-Minerals (MULTIVITAMIN ADULTS 50+) TABS Take 1 tablet by mouth daily in the afternoon.   Vitamin D, Ergocalciferol, (DRISDOL) 1.25 MG (50000 UNIT) CAPS capsule Take 1 capsule (50,000 Units total) by mouth every 7 (seven) days.   [DISCONTINUED] NOVOLOG FLEXPEN 100 UNIT/ML FlexPen Inject 8-14 Units into the skin 3 (three) times daily with meals.   NOVOLOG FLEXPEN 100 UNIT/ML FlexPen Inject 8-14 Units into the skin 3 (three) times daily with meals.   No facility-administered encounter medications on file as of 08/20/2022.   ALLERGIES: No Known Allergies VACCINATION STATUS:  There is no immunization history on file for this patient.  Diabetes She presents for her follow-up diabetic visit. She has type 1 (She is administratively classified as type 1 diabetes for management purposes.) diabetes mellitus. Onset time: She was diagnosed at approximate age of 42 years. Her disease course has been fluctuating. Hypoglycemia symptoms include sweats and tremors. Pertinent negatives for hypoglycemia include no confusion, headaches, pallor or seizures. Associated symptoms include blurred vision, fatigue, polydipsia, polyuria and weight loss. Pertinent negatives for diabetes include no chest pain, no foot paresthesias and no polyphagia. There are no hypoglycemic complications. Symptoms are improving. Diabetic complications include nephropathy and peripheral neuropathy. Risk factors for coronary artery  disease include diabetes mellitus, sedentary lifestyle, hypertension, dyslipidemia and stress. Current diabetic treatment includes intensive insulin program. She is compliant with treatment some of the time. Her weight is decreasing steadily. She is following a generally unhealthy diet. When asked about meal planning, she reported none. She has had a previous visit with a  dietitian. She never participates in exercise. Her home blood glucose trend is fluctuating dramatically. (She presents today with her logs, no meter, showing fluctuating glycemic profile with gross hyperglycemia overall.  Her POCT A1c today is 11.2%, improving from last visit of 11.5%.  She has skipped doses of insulin due to forgetting or falling asleep early.  She notes she does not have symptoms of hyperglycemia, only when it is low does she feel bad.  She has been more consistent recently with taking her medications.) An ACE inhibitor/angiotensin II receptor blocker is being taken. She does not see a podiatrist.Eye exam is current.  Hypertension This is a chronic problem. The current episode started more than 1 year ago. The problem is unchanged. The problem is uncontrolled. Associated symptoms include blurred vision and sweats. Pertinent negatives include no chest pain, headaches, palpitations or shortness of breath. There are no associated agents to hypertension. Risk factors for coronary artery disease include diabetes mellitus, dyslipidemia, sedentary lifestyle and post-menopausal state. Past treatments include ACE inhibitors and diuretics. The current treatment provides mild improvement. Compliance problems include diet, exercise and psychosocial issues.  Hypertensive end-organ damage includes kidney disease. Identifiable causes of hypertension include chronic renal disease.    Review of systems  Constitutional: + decreasing body weight,  current Body mass index is 29.1 kg/m. , no fatigue, no subjective hyperthermia, no subjective hypothermia Eyes: no blurry vision, no xerophthalmia ENT: no sore throat, no nodules palpated in throat, no dysphagia/odynophagia, no hoarseness Cardiovascular: no chest pain, no shortness of breath, no palpitations, no leg swelling Respiratory: no cough, no shortness of breath Gastrointestinal: no nausea/vomiting/diarrhea Musculoskeletal: no muscle/joint aches,  walks with cane Skin: no rashes, no hyperemia Neurological: no tremors, no dizziness, intermittent numbness/tingling to bilateral feet Psychiatric: no depression, no anxiety  Objective:    BP 138/70 (BP Location: Left Arm, Patient Position: Sitting, Cuff Size: Normal)   Pulse 94   Ht 5' (1.524 m)   Wt 149 lb (67.6 kg)   BMI 29.10 kg/m   Wt Readings from Last 3 Encounters:  08/20/22 149 lb (67.6 kg)  05/20/22 151 lb 6.4 oz (68.7 kg)  01/14/22 153 lb 3.2 oz (69.5 kg)    BP Readings from Last 3 Encounters:  08/20/22 138/70  05/20/22 138/70  01/14/22 (!) 168/98    Physical Exam- Limited  Constitutional:  Body mass index is 29.1 kg/m. , not in acute distress, normal state of mind Eyes:  EOMI, no exophthalmos Musculoskeletal: no gross deformities, strength intact in all four extremities, no gross restriction of joint movements, walks with cane Skin:  no rashes, no hyperemia Neurological: no tremor with outstretched hands  Diabetic Foot Exam - Simple   No data filed    Recent Results (from the past 2160 hour(s))  HgB A1c     Status: Abnormal   Collection Time: 08/20/22  8:31 AM  Result Value Ref Range   Hemoglobin A1C 11.2 (A) 4.0 - 5.6 %   HbA1c POC (<> result, manual entry)     HbA1c, POC (prediabetic range)     HbA1c, POC (controlled diabetic range)        Assessment & Plan:   1) Uncontrolled  type 1 diabetes mellitus-chronically uncontrolled  - Patient has currently uncontrolled symptomatic type 1 DM (administrative reclassified as type I for management purposes) since  62 years of age.  She presents today with her logs, no meter, showing fluctuating glycemic profile with gross hyperglycemia overall.  Her POCT A1c today is 11.2%, improving from last visit of 11.5%.  She has skipped doses of insulin due to forgetting or falling asleep early.  She notes she does not have symptoms of hyperglycemia, only when it is low does she feel bad.  She has been more consistent  recently with taking her medications.  She says she never heard about the referral to nephrology made last visit.   - Her diabetes is complicated by neuropathy, patient remains at a high risk for more acute and chronic complications of diabetes which include CAD, CVA, CKD, retinopathy, and neuropathy. These are all discussed in detail with the patient.  - Nutritional counseling repeated at each appointment due to patients tendency to fall back in to old habits.  - The patient admits there is a room for improvement in their diet and drink choices. -  Suggestion is made for the patient to avoid simple carbohydrates from their diet including Cakes, Sweet Desserts / Pastries, Ice Cream, Soda (diet and regular), Sweet Tea, Candies, Chips, Cookies, Sweet Pastries, Store Bought Juices, Alcohol in Excess of 1-2 drinks a day, Artificial Sweeteners, Coffee Creamer, and "Sugar-free" Products. This will help patient to have stable blood glucose profile and potentially avoid unintended weight gain.   - I encouraged the patient to switch to unprocessed or minimally processed complex starch and increased protein intake (animal or plant source), fruits, and vegetables.   - Patient is advised to stick to a routine mealtimes to eat 3 meals a day and avoid unnecessary snacks (to snack only to correct hypoglycemia).  -She has seen Norm Salt, RDE in the past for individualized education.  - I have approached patient with the following individualized plan to manage diabetes and patient agrees:   -She will continue to need intensive treatment with higher dose of basal/bolus insulin in order for her to achieve and maintain control of diabetes to target.   -She is advised to continue her Levemir 28 units SQ nightly and lower her Novolog dose to 8-14 units TID with meals if glucose is above 90 and she is eating (Specific instructions on how to titrate insulin dosage based on glucose readings given to patient in  writing).   She is advised to be consistent with her medications.     -She is encouraged start consistently monitoring blood glucose 4 times daily, before meals and at bedtime and report to the clinic if blood glucose levels are less than 70 or greater than 300 for 3 tests in a row. She did not like her CGM (alarmed too much for her).  - She is not a candidate for metformin, SGLT2 inhibitors, nor incretin therapy.  - Patient specific target  A1c;  LDL, HDL, Triglycerides, and  Waist Circumference were discussed in detail.  2) BP/HTN:  Her blood pressure is not controlled to target, says she has run out of medication.   She is advised to continue Lisinopril-HCT 10-12.5 mg po daily- sent in new script for her today.  I referred her to nephrology at last visit but she did not follow through.  I gave her the information to contact the kidney doctor to get in for consultation.  3) Lipids/HPL: Her recent lipid panel  from 05/14/22 shows uncontrolled LDL at 120.  She notes she has not been taking her Lipitor consistently, I resent in script for her today and advised her to be more consistent with it. She is advised to continue Lipitor 10 mg po daily at bedtime.  Side effects and precautions discussed with her.     4)  Weight/Diet: Her Body mass index is 29.1 kg/m.   Further weight gain not recommended for her.  A few years ago, she did have significant weight deficit.  CDE Consult in progress , exercise, and detailed carbohydrates information provided.  Side effects and precautions discussed with her.  5) Vitamin D deficiency.   Her recent vitamin D level was 11.1 on 05/14/22.  She is not currently taking any supplementation.  I started her on Ergocalciferol 50000 units po weekly.  6) Chronic Care/Health Maintenance: -Patient is on ACEI/ARB medications and Statin medication and encouraged to continue to follow up with Ophthalmology, Podiatrist at least yearly or according to recommendations, and advised to    stay away from smoking. I have recommended yearly flu vaccine and pneumonia vaccination at least every 5 years; moderate intensity exercise for up to 150 minutes weekly; and  sleep for at least 7 hours a day.    - I advised patient to maintain close follow up with House, Eugenio Hoes, FNP for primary care needs.     I spent  35  minutes in the care of the patient today including review of labs from CMP, Lipids, Thyroid Function, Hematology (current and previous including abstractions from other facilities); face-to-face time discussing  her blood glucose readings/logs, discussing hypoglycemia and hyperglycemia episodes and symptoms, medications doses, her options of short and long term treatment based on the latest standards of care / guidelines;  discussion about incorporating lifestyle medicine;  and documenting the encounter. Risk reduction counseling performed per USPSTF guidelines to reduce obesity and cardiovascular risk factors.     Please refer to Patient Instructions for Blood Glucose Monitoring and Insulin/Medications Dosing Guide"  in media tab for additional information. Please  also refer to " Patient Self Inventory" in the Media  tab for reviewed elements of pertinent patient history.  Stevphen Rochester participated in the discussions, expressed understanding, and voiced agreement with the above plans.  All questions were answered to her satisfaction. she is encouraged to contact clinic should she have any questions or concerns prior to her return visit.  Follow up plan: - Return in about 3 months (around 11/20/2022) for Diabetes F/U with A1c in office, No previsit labs, Bring meter and logs.  Ronny Bacon, [facility] [address] [place], [place] [address] Phone: [phone] Fax: [phone]   08/20/2022, 9:41 AM

## 2022-11-04 ENCOUNTER — Other Ambulatory Visit: Payer: Self-pay | Admitting: Nurse Practitioner

## 2022-11-09 ENCOUNTER — Other Ambulatory Visit: Payer: Self-pay | Admitting: Nurse Practitioner

## 2022-11-09 MED ORDER — TRESIBA FLEXTOUCH 100 UNIT/ML ~~LOC~~ SOPN: 28.0000 [IU] | PEN_INJECTOR | Freq: Every day | SUBCUTANEOUS | 3 refills | Status: DC

## 2022-11-20 ENCOUNTER — Ambulatory Visit: Payer: MEDICAID | Admitting: Nurse Practitioner

## 2022-11-20 DIAGNOSIS — E559 Vitamin D deficiency, unspecified: Secondary | ICD-10-CM

## 2022-11-20 DIAGNOSIS — E1022 Type 1 diabetes mellitus with diabetic chronic kidney disease: Secondary | ICD-10-CM

## 2022-11-20 DIAGNOSIS — E782 Mixed hyperlipidemia: Secondary | ICD-10-CM

## 2022-11-20 DIAGNOSIS — Z794 Long term (current) use of insulin: Secondary | ICD-10-CM

## 2022-11-20 DIAGNOSIS — E1065 Type 1 diabetes mellitus with hyperglycemia: Secondary | ICD-10-CM

## 2022-11-20 DIAGNOSIS — I10 Essential (primary) hypertension: Secondary | ICD-10-CM

## 2022-11-23 ENCOUNTER — Ambulatory Visit (INDEPENDENT_AMBULATORY_CARE_PROVIDER_SITE_OTHER): Payer: MEDICAID | Admitting: Nurse Practitioner

## 2022-11-23 ENCOUNTER — Encounter: Payer: Self-pay | Admitting: Nurse Practitioner

## 2022-11-23 VITALS — BP 160/90 | HR 102 | Ht 60.0 in | Wt 150.2 lb

## 2022-11-23 DIAGNOSIS — I10 Essential (primary) hypertension: Secondary | ICD-10-CM | POA: Diagnosis not present

## 2022-11-23 DIAGNOSIS — Z794 Long term (current) use of insulin: Secondary | ICD-10-CM

## 2022-11-23 DIAGNOSIS — E1065 Type 1 diabetes mellitus with hyperglycemia: Secondary | ICD-10-CM | POA: Diagnosis not present

## 2022-11-23 DIAGNOSIS — E782 Mixed hyperlipidemia: Secondary | ICD-10-CM

## 2022-11-23 DIAGNOSIS — N183 Chronic kidney disease, stage 3 unspecified: Secondary | ICD-10-CM

## 2022-11-23 DIAGNOSIS — E1022 Type 1 diabetes mellitus with diabetic chronic kidney disease: Secondary | ICD-10-CM | POA: Diagnosis not present

## 2022-11-23 LAB — POCT GLYCOSYLATED HEMOGLOBIN (HGB A1C): Hemoglobin A1C: 10.9 % — AB (ref 4.0–5.6)

## 2022-11-23 MED ORDER — AMLODIPINE BESYLATE 5 MG PO TABS: 5.0000 mg | ORAL_TABLET | Freq: Every day | ORAL | 3 refills | Status: DC

## 2022-11-23 MED ORDER — NOVOLOG FLEXPEN 100 UNIT/ML ~~LOC~~ SOPN: 8.0000 [IU] | PEN_INJECTOR | Freq: Three times a day (TID) | SUBCUTANEOUS | 3 refills | Status: DC

## 2022-11-23 MED ORDER — LISINOPRIL-HYDROCHLOROTHIAZIDE 10-12.5 MG PO TABS: 1.0000 | ORAL_TABLET | Freq: Every day | ORAL | 3 refills | Status: AC

## 2022-11-23 MED ORDER — RELION PEN NEEDLES 32G X 4 MM MISC: 3 refills | Status: AC

## 2022-11-23 MED ORDER — ATORVASTATIN CALCIUM 10 MG PO TABS: 10.0000 mg | ORAL_TABLET | Freq: Every day | ORAL | 3 refills | Status: AC

## 2022-11-23 NOTE — Progress Notes (Signed)
11/23/2022  Endocrinology follow-up note    Subjective:    Patient ID: Katherine Patton, female    DOB: 12-31-1960. Patient is being seen in follow-up for management of chronically uncontrolled diabetes . PCP: Colvin Caroli, FNP.  Past Medical History:  Diagnosis Date   Diabetes mellitus without complication (HCC)    Uterine mass    Past Surgical History:  Procedure Laterality Date   CATARACT EXTRACTION     Social History   Socioeconomic History   Marital status: Single    Spouse name: Not on file   Number of children: Not on file   Years of education: Not on file   Highest education level: Not on file  Occupational History   Not on file  Tobacco Use   Smoking status: Never   Smokeless tobacco: Never  Vaping Use   Vaping status: Never Used  Substance and Sexual Activity   Alcohol use: No   Drug use: No   Sexual activity: Not Currently    Birth control/protection: None  Other Topics Concern   Not on file  Social History Narrative   Not on file   Social Determinants of Health   Financial Resource Strain: Not on file  Food Insecurity: Not on file  Transportation Needs: Not on file  Physical Activity: Not on file  Stress: Not on file  Social Connections: Not on file   Outpatient Encounter Medications as of 11/23/2022  Medication Sig   acetaminophen (TYLENOL) 500 MG tablet Take 500 mg by mouth every 6 (six) hours as needed for headache.   amLODipine (NORVASC) 5 MG tablet Take 1 tablet (5 mg total) by mouth daily.   Blood Glucose Monitoring Suppl (ACCU-CHEK GUIDE) w/Device KIT Use to monitor glucose 4 times daily.   Cholecalciferol 2000 units CAPS Take 1 capsule (2,000 Units total) by mouth daily with breakfast.   glucose blood (ACCU-CHEK GUIDE) test strip USE 1 STRIP TO CHECK GLUCOSE 4 TIMES DAILY   Multiple Vitamins-Minerals (MULTIVITAMIN ADULTS 50+) TABS Take 1 tablet by mouth daily in the afternoon.   [DISCONTINUED] atorvastatin (LIPITOR) 10 MG tablet Take 1  tablet (10 mg total) by mouth daily.   [DISCONTINUED] insulin detemir (LEVEMIR) 100 UNIT/ML FlexPen Inject 28 Units into the skin daily. Patient reports that she inject 1/2 or less at night   [DISCONTINUED] Insulin Pen Needle (RELION PEN NEEDLES) 32G X 4 MM MISC Use three times daily with injections   [DISCONTINUED] lisinopril-hydrochlorothiazide (ZESTORETIC) 10-12.5 MG tablet Take 1 tablet by mouth daily.   atorvastatin (LIPITOR) 10 MG tablet Take 1 tablet (10 mg total) by mouth daily.   insulin degludec (TRESIBA FLEXTOUCH) 100 UNIT/ML FlexTouch Pen Inject 28 Units into the skin at bedtime. (Patient not taking: Reported on 11/23/2022)   Insulin Pen Needle (RELION PEN NEEDLES) 32G X 4 MM MISC Use three times daily with injections   lisinopril-hydrochlorothiazide (ZESTORETIC) 10-12.5 MG tablet Take 1 tablet by mouth daily.   NOVOLOG FLEXPEN 100 UNIT/ML FlexPen Inject 8-14 Units into the skin 3 (three) times daily with meals.   Vitamin D, Ergocalciferol, (DRISDOL) 1.25 MG (50000 UNIT) CAPS capsule Take 1 capsule (50,000 Units total) by mouth every 7 (seven) days.   [DISCONTINUED] NOVOLOG FLEXPEN 100 UNIT/ML FlexPen Inject 8-14 Units into the skin 3 (three) times daily with meals.   No facility-administered encounter medications on file as of 11/23/2022.   ALLERGIES: No Known Allergies VACCINATION STATUS:  There is no immunization history on file for this patient.  Diabetes She presents for  her follow-up diabetic visit. She has type 1 (She is administratively classified as type 1 diabetes for management purposes.) diabetes mellitus. Onset time: She was diagnosed at approximate age of 42 years. Her disease course has been fluctuating. Hypoglycemia symptoms include sweats and tremors. Pertinent negatives for hypoglycemia include no confusion, headaches, pallor or seizures. Associated symptoms include blurred vision, fatigue, polydipsia and polyuria. Pertinent negatives for diabetes include no chest  pain, no foot paresthesias, no polyphagia and no weight loss. There are no hypoglycemic complications. Symptoms are improving. Diabetic complications include nephropathy and peripheral neuropathy. Risk factors for coronary artery disease include diabetes mellitus, sedentary lifestyle, hypertension, dyslipidemia and stress. Current diabetic treatment includes intensive insulin program. She is compliant with treatment some of the time. Her weight is fluctuating minimally. She is following a generally unhealthy diet. When asked about meal planning, she reported none. She has had a previous visit with a dietitian. She never participates in exercise. Her home blood glucose trend is fluctuating dramatically. Her overall blood glucose range is >200 mg/dl. (She presents today with her logs, and meter, showing fluctuating glycemic profile with gross hyperglycemia overall.  Her POCT A1c today is 10.9%, improving from last visit of 11.2%.  She has skipped doses of insulin due to forgetting or falling asleep early.  She also notes she has injected the [place] (changed from Levemir due to product discontinuation) and it caused her nausea.  Analysis of her meter shows 7-day average of 266, 14-day average of 278, 30-day average of 274, 90-day average of 278.) An ACE inhibitor/angiotensin II receptor blocker is being taken. She does not see a podiatrist.Eye exam is current.  Hypertension This is a chronic problem. The current episode started more than 1 year ago. The problem is unchanged. The problem is uncontrolled. Associated symptoms include blurred vision and sweats. Pertinent negatives include no chest pain, headaches, palpitations or shortness of breath. There are no associated agents to hypertension. Risk factors for coronary artery disease include diabetes mellitus, dyslipidemia, sedentary lifestyle and post-menopausal state. Past treatments include ACE inhibitors and diuretics. The current treatment provides mild  improvement. Compliance problems include diet, exercise and psychosocial issues.  Hypertensive end-organ damage includes kidney disease. Identifiable causes of hypertension include chronic renal disease.    Review of systems  Constitutional: + stable body weight,  current Body mass index is 29.33 kg/m. , no fatigue, no subjective hyperthermia, no subjective hypothermia Eyes: no blurry vision, no xerophthalmia ENT: no sore throat, no nodules palpated in throat, no dysphagia/odynophagia, no hoarseness Cardiovascular: no chest pain, no shortness of breath, no palpitations, no leg swelling Respiratory: no cough, no shortness of breath Gastrointestinal: no nausea/vomiting/diarrhea Musculoskeletal: no muscle/joint aches, walks with cane Skin: no rashes, no hyperemia Neurological: no tremors, no dizziness, intermittent numbness/tingling to bilateral feet Psychiatric: no depression, no anxiety  Objective:    BP (!) 160/90 (BP Location: Left Arm, Patient Position: Sitting, Cuff Size: Large) Comment: Patient states that she has not taken her blood presssure medication, Marquan Vokes was made aware.  Pulse (!) 102   Ht 5' (1.524 m)   Wt 150 lb 3.2 oz (68.1 kg)   BMI 29.33 kg/m   Wt Readings from Last 3 Encounters:  11/23/22 150 lb 3.2 oz (68.1 kg)  08/20/22 149 lb (67.6 kg)  05/20/22 151 lb 6.4 oz (68.7 kg)    BP Readings from Last 3 Encounters:  11/23/22 (!) 160/90  08/20/22 138/70  05/20/22 138/70    Physical Exam- Limited  Constitutional:  Body mass  index is 29.33 kg/m. , not in acute distress, normal state of mind Eyes:  EOMI, no exophthalmos Musculoskeletal: no gross deformities, strength intact in all four extremities, no gross restriction of joint movements, walks with cane Skin:  no rashes, no hyperemia Neurological: no tremor with outstretched hands  Diabetic Foot Exam - Simple   No data filed    Recent Results (from the past 2160 hour(s))  HgB A1c     Status: Abnormal    Collection Time: 11/23/22 10:44 AM  Result Value Ref Range   Hemoglobin A1C 10.9 (A) 4.0 - 5.6 %   HbA1c POC (<> result, manual entry)     HbA1c, POC (prediabetic range)     HbA1c, POC (controlled diabetic range)         Assessment & Plan:   1) Uncontrolled type 1 diabetes mellitus-chronically uncontrolled  - Patient has currently uncontrolled symptomatic type 1 DM (administrative reclassified as type I for management purposes) since  62 years of age.  She presents today with her logs, and meter, showing fluctuating glycemic profile with gross hyperglycemia overall.  Her POCT A1c today is 10.9%, improving from last visit of 11.2%.  She has skipped doses of insulin due to forgetting or falling asleep early.  She also notes she has injected the [place] (changed from Levemir due to product discontinuation) and it caused her nausea.  Analysis of her meter shows 7-day average of 266, 14-day average of 278, 30-day average of 274, 90-day average of 278.   - Her diabetes is complicated by neuropathy, patient remains at a high risk for more acute and chronic complications of diabetes which include CAD, CVA, CKD, retinopathy, and neuropathy. These are all discussed in detail with the patient.  - Nutritional counseling repeated at each appointment due to patients tendency to fall back in to old habits.  - The patient admits there is a room for improvement in their diet and drink choices. -  Suggestion is made for the patient to avoid simple carbohydrates from their diet including Cakes, Sweet Desserts / Pastries, Ice Cream, Soda (diet and regular), Sweet Tea, Candies, Chips, Cookies, Sweet Pastries, Store Bought Juices, Alcohol in Excess of 1-2 drinks a day, Artificial Sweeteners, Coffee Creamer, and "Sugar-free" Products. This will help patient to have stable blood glucose profile and potentially avoid unintended weight gain.   - I encouraged the patient to switch to unprocessed or minimally  processed complex starch and increased protein intake (animal or plant source), fruits, and vegetables.   - Patient is advised to stick to a routine mealtimes to eat 3 meals a day and avoid unnecessary snacks (to snack only to correct hypoglycemia).  -She has seen Norm Salt, RDE in the past for individualized education.  - I have approached patient with the following individualized plan to manage diabetes and patient agrees:   -She will continue to need intensive treatment with higher dose of basal/bolus insulin in order for her to achieve and maintain control of diabetes to target.   -She is advised to continue her Tresiba 28 units (not likely to have caused her nausea and vomiting) SQ nightly and continue her Novolog dose to 8-14 units TID with meals if glucose is above 90 and she is eating (Specific instructions on how to titrate insulin dosage based on glucose readings given to patient in writing).   She is advised to be consistent with her medications and she promises she will do better.   -She is encouraged start consistently  monitoring blood glucose 4 times daily, before meals and at bedtime and report to the clinic if blood glucose levels are less than 70 or greater than 300 for 3 tests in a row. She did not like her CGM (alarmed too much for her).  - She is not a candidate for metformin, SGLT2 inhibitors, nor incretin therapy.  - Patient specific target  A1c;  LDL, HDL, Triglycerides, and  Waist Circumference were discussed in detail.  2) BP/HTN:  Her blood pressure is not controlled to target, says she has run out of medication.   She is advised to continue Lisinopril-HCT 10-12.5 mg po daily- sent in new script for her today.  I referred her to nephrology at last visit but she did not follow through.  I gave her the information to contact the kidney doctor to get in for consultation.  3) Lipids/HPL: Her recent lipid panel from 05/14/22 shows uncontrolled LDL at 120.  She notes she  has not been taking her Lipitor consistently, I resent in script for her today and advised her to be more consistent with it. She is advised to continue Lipitor 10 mg po daily at bedtime.  Side effects and precautions discussed with her.  Will recheck lipid panel prior to next visit.   4)  Weight/Diet: Her Body mass index is 29.33 kg/m.   Further weight gain not recommended for her.  A few years ago, she did have significant weight deficit.  CDE Consult in progress , exercise, and detailed carbohydrates information provided.  Side effects and precautions discussed with her.  5) Vitamin D deficiency.   Her recent vitamin D level was 11.1 on 05/14/22.  She is not currently taking any supplementation.  I started her on Ergocalciferol 50000 units po weekly.  6) Chronic Care/Health Maintenance: -Patient is on ACEI/ARB medications and Statin medication and encouraged to continue to follow up with Ophthalmology, Podiatrist at least yearly or according to recommendations, and advised to   stay away from smoking. I have recommended yearly flu vaccine and pneumonia vaccination at least every 5 years; moderate intensity exercise for up to 150 minutes weekly; and  sleep for at least 7 hours a day.    - I advised patient to maintain close follow up with House, Eugenio Hoes, FNP for primary care needs.     I spent  41  minutes in the care of the patient today including review of labs from CMP, Lipids, Thyroid Function, Hematology (current and previous including abstractions from other facilities); face-to-face time discussing  her blood glucose readings/logs, discussing hypoglycemia and hyperglycemia episodes and symptoms, medications doses, her options of short and long term treatment based on the latest standards of care / guidelines;  discussion about incorporating lifestyle medicine;  and documenting the encounter. Risk reduction counseling performed per USPSTF guidelines to reduce obesity and cardiovascular risk  factors.     Please refer to Patient Instructions for Blood Glucose Monitoring and Insulin/Medications Dosing Guide"  in media tab for additional information. Please  also refer to " Patient Self Inventory" in the Media  tab for reviewed elements of pertinent patient history.  Stevphen Rochester participated in the discussions, expressed understanding, and voiced agreement with the above plans.  All questions were answered to her satisfaction. she is encouraged to contact clinic should she have any questions or concerns prior to her return visit.  Follow up plan: - Return in about 3 months (around 02/23/2023) for Diabetes F/U with A1c in office, Previsit labs,  Bring meter and logs.  Ronny Bacon, [facility] [address] [place], [place] [address] Phone: [phone] Fax: [phone]   11/23/2022, 11:53 AM

## 2023-02-18 LAB — COMPREHENSIVE METABOLIC PANEL
ALT: 15 [IU]/L (ref 0–32)
AST: 23 [IU]/L (ref 0–40)
Albumin: 3.9 g/dL (ref 3.8–4.9)
Alkaline Phosphatase: 139 [IU]/L — ABNORMAL HIGH (ref 44–121)
BUN/Creatinine Ratio: 15 (ref 12–28)
BUN: 27 mg/dL (ref 8–27)
Bilirubin Total: <0.2 mg/dL (ref 0.0–1.2)
CO2: 24 mmol/L (ref 20–29)
Calcium: 9.1 mg/dL (ref 8.7–10.3)
Chloride: 100 mmol/L (ref 96–106)
Creatinine, Ser: 1.81 mg/dL — ABNORMAL HIGH (ref 0.57–1.00)
Globulin, Total: 2.9 g/dL (ref 1.5–4.5)
Glucose: 316 mg/dL — ABNORMAL HIGH (ref 70–99)
Potassium: 4.7 mmol/L (ref 3.5–5.2)
Sodium: 137 mmol/L (ref 134–144)
Total Protein: 6.8 g/dL (ref 6.0–8.5)
eGFR: 32 mL/min/{1.73_m2} — ABNORMAL LOW (ref >59)

## 2023-02-18 LAB — LIPID PANEL
Chol/HDL Ratio: 3.7 {ratio} (ref 0.0–4.4)
Cholesterol, Total: 229 mg/dL — ABNORMAL HIGH (ref 100–199)
HDL: 62 mg/dL (ref >39)
LDL Chol Calc (NIH): 143 mg/dL — ABNORMAL HIGH (ref 0–99)
Triglycerides: 137 mg/dL (ref 0–149)
VLDL Cholesterol Cal: 24 mg/dL (ref 5–40)

## 2023-02-18 LAB — TSH: TSH: 1.88 u[IU]/mL (ref 0.450–4.500)

## 2023-02-18 LAB — T4, FREE: Free T4: 1.33 ng/dL (ref 0.82–1.77)

## 2023-02-25 ENCOUNTER — Telehealth: Payer: Self-pay | Admitting: Nurse Practitioner

## 2023-02-25 ENCOUNTER — Ambulatory Visit: Payer: MEDICAID | Admitting: Nurse Practitioner

## 2023-02-25 DIAGNOSIS — E782 Mixed hyperlipidemia: Secondary | ICD-10-CM

## 2023-02-25 DIAGNOSIS — E559 Vitamin D deficiency, unspecified: Secondary | ICD-10-CM

## 2023-02-25 DIAGNOSIS — Z794 Long term (current) use of insulin: Secondary | ICD-10-CM

## 2023-02-25 DIAGNOSIS — E1065 Type 1 diabetes mellitus with hyperglycemia: Secondary | ICD-10-CM

## 2023-02-25 DIAGNOSIS — Z91199 Patient's noncompliance with other medical treatment and regimen due to unspecified reason: Secondary | ICD-10-CM

## 2023-02-25 DIAGNOSIS — E1022 Type 1 diabetes mellitus with diabetic chronic kidney disease: Secondary | ICD-10-CM

## 2023-02-25 DIAGNOSIS — I10 Essential (primary) hypertension: Secondary | ICD-10-CM

## 2023-02-25 MED ORDER — ACCU-CHEK GUIDE TEST VI STRP: ORAL_STRIP | 6 refills | Status: DC

## 2023-02-25 MED ORDER — TRESIBA FLEXTOUCH 100 UNIT/ML ~~LOC~~ SOPN: 28.0000 [IU] | PEN_INJECTOR | Freq: Every day | SUBCUTANEOUS | 3 refills | Status: DC

## 2023-02-25 NOTE — Telephone Encounter (Signed)
Patient had to cancel appt today. Moved to April 29th. She is asking for refill on her tresiba and test strips to be sent to [facility]

## 2023-02-25 NOTE — Telephone Encounter (Signed)
done

## 2023-05-11 ENCOUNTER — Encounter: Payer: Self-pay | Admitting: Nurse Practitioner

## 2023-05-11 ENCOUNTER — Ambulatory Visit (INDEPENDENT_AMBULATORY_CARE_PROVIDER_SITE_OTHER): Payer: MEDICAID | Admitting: Nurse Practitioner

## 2023-05-11 VITALS — BP 138/78 | HR 109 | Ht 60.0 in | Wt 151.0 lb

## 2023-05-11 DIAGNOSIS — I10 Essential (primary) hypertension: Secondary | ICD-10-CM

## 2023-05-11 DIAGNOSIS — E559 Vitamin D deficiency, unspecified: Secondary | ICD-10-CM

## 2023-05-11 DIAGNOSIS — E782 Mixed hyperlipidemia: Secondary | ICD-10-CM | POA: Diagnosis not present

## 2023-05-11 DIAGNOSIS — Z794 Long term (current) use of insulin: Secondary | ICD-10-CM

## 2023-05-11 DIAGNOSIS — E1065 Type 1 diabetes mellitus with hyperglycemia: Secondary | ICD-10-CM | POA: Diagnosis not present

## 2023-05-11 LAB — POCT GLYCOSYLATED HEMOGLOBIN (HGB A1C): Hemoglobin A1C: 11.2 % — AB (ref 4.0–5.6)

## 2023-05-11 NOTE — Progress Notes (Signed)
 05/11/2023  Endocrinology follow-up note    Subjective:    Patient ID: Katherine Patton, female    DOB: Oct 16, 1960. Patient is being seen in follow-up for management of chronically uncontrolled diabetes . PCP: Merian Stamps, FNP.  Past Medical History:  Diagnosis Date   Diabetes mellitus without complication (HCC)    Uterine mass    Past Surgical History:  Procedure Laterality Date   CATARACT EXTRACTION     Social History   Socioeconomic History   Marital status: Single    Spouse name: Not on file   Number of children: Not on file   Years of education: Not on file   Highest education level: Not on file  Occupational History   Not on file  Tobacco Use   Smoking status: Never   Smokeless tobacco: Never  Vaping Use   Vaping status: Never Used  Substance and Sexual Activity   Alcohol use: No   Drug use: No   Sexual activity: Not Currently    Birth control/protection: None  Other Topics Concern   Not on file  Social History Narrative   Not on file   Social Drivers of Health   Financial Resource Strain: Not on file  Food Insecurity: Not on file  Transportation Needs: Not on file  Physical Activity: Not on file  Stress: Not on file  Social Connections: Not on file   Outpatient Encounter Medications as of 05/11/2023  Medication Sig   acetaminophen (TYLENOL) 500 MG tablet Take 500 mg by mouth every 6 (six) hours as needed for headache.   amLODipine  (NORVASC ) 5 MG tablet Take 1 tablet (5 mg total) by mouth daily.   atorvastatin  (LIPITOR) 10 MG tablet Take 1 tablet (10 mg total) by mouth daily.   Blood Glucose Monitoring Suppl (ACCU-CHEK GUIDE) w/Device KIT Use to monitor glucose 4 times daily.   Cholecalciferol  2000 units CAPS Take 1 capsule (2,000 Units total) by mouth daily with breakfast.   glucose blood (ACCU-CHEK GUIDE TEST) test strip Use as instructed to monitor glucose 4 times daily   insulin  degludec (TRESIBA  FLEXTOUCH) 100 UNIT/ML FlexTouch Pen Inject 28 Units  into the skin at bedtime.   Insulin  Pen Needle (RELION PEN NEEDLES) 32G X 4 MM MISC Use three times daily with injections   NOVOLOG  FLEXPEN 100 UNIT/ML FlexPen Inject 8-14 Units into the skin 3 (three) times daily with meals.   Vitamin D , Ergocalciferol , (DRISDOL ) 1.25 MG (50000 UNIT) CAPS capsule Take 1 capsule (50,000 Units total) by mouth every 7 (seven) days.   lisinopril -hydrochlorothiazide  (ZESTORETIC ) 10-12.5 MG tablet Take 1 tablet by mouth daily.   Multiple Vitamins-Minerals (MULTIVITAMIN ADULTS 50+) TABS Take 1 tablet by mouth daily in the afternoon.   No facility-administered encounter medications on file as of 05/11/2023.   ALLERGIES: No Known Allergies VACCINATION STATUS:  There is no immunization history on file for this patient.  Diabetes She presents for her follow-up diabetic visit. She has type 1 (She is administratively classified as type 1 diabetes for management purposes.) diabetes mellitus. Onset time: She was diagnosed at approximate age of 42 years. Her disease course has been fluctuating. Hypoglycemia symptoms include sweats and tremors. Pertinent negatives for hypoglycemia include no confusion, headaches, pallor or seizures. Associated symptoms include blurred vision, fatigue, polydipsia and polyuria. Pertinent negatives for diabetes include no chest pain, no foot paresthesias, no polyphagia and no weight loss. There are no hypoglycemic complications. Symptoms are improving. Diabetic complications include nephropathy and peripheral neuropathy. Risk factors for coronary artery disease  include diabetes mellitus, sedentary lifestyle, hypertension, dyslipidemia and stress. Current diabetic treatment includes intensive insulin  program. She is compliant with treatment some of the time. Her weight is fluctuating minimally. She is following a generally unhealthy diet. When asked about meal planning, she reported none. She has had a previous visit with a dietitian. She never  participates in exercise. Her home blood glucose trend is fluctuating dramatically. Her breakfast blood glucose range is generally 90-110 mg/dl. Her lunch blood glucose range is generally 140-180 mg/dl. Her dinner blood glucose range is generally 180-200 mg/dl. Her bedtime blood glucose range is generally >200 mg/dl. Her overall blood glucose range is >200 mg/dl. (She presents today with her logs, and meter, showing fluctuating glycemic profile with gross hyperglycemia overall.  Her POCT A1c today is 11.2%, increasing from last visit of 10.9%. She notes she has been eating more sugary snacks before bed.  Her fasting readings are at goal, sometimes tight.  She never did follow through with nephrology consult I initiated.) An ACE inhibitor/angiotensin II receptor blocker is being taken. She does not see a podiatrist.Eye exam is current.  Hypertension This is a chronic problem. The current episode started more than 1 year ago. The problem is unchanged. The problem is uncontrolled. Associated symptoms include blurred vision and sweats. Pertinent negatives include no chest pain, headaches, palpitations or shortness of breath. There are no associated agents to hypertension. Risk factors for coronary artery disease include diabetes mellitus, dyslipidemia, sedentary lifestyle and post-menopausal state. Past treatments include ACE inhibitors and diuretics. The current treatment provides mild improvement. Compliance problems include diet, exercise and psychosocial issues.  Hypertensive end-organ damage includes kidney disease. Identifiable causes of hypertension include chronic renal disease.    Review of systems  Constitutional: + stable body weight,  current Body mass index is 29.49 kg/m. , no fatigue, no subjective hyperthermia, no subjective hypothermia Eyes: no blurry vision, no xerophthalmia ENT: no sore throat, no nodules palpated in throat, no dysphagia/odynophagia, no hoarseness Cardiovascular: no chest  pain, no shortness of breath, no palpitations, no leg swelling Respiratory: no cough, no shortness of breath Gastrointestinal: no nausea/vomiting/diarrhea Musculoskeletal: no muscle/joint aches, walks with cane Skin: no rashes, no hyperemia Neurological: no tremors, no dizziness, intermittent numbness/tingling to bilateral feet Psychiatric: no depression, no anxiety  Objective:    BP 138/78 (BP Location: Right Arm, Patient Position: Sitting, Cuff Size: Large)   Pulse (!) 109   Ht 5' (1.524 m)   Wt 151 lb (68.5 kg)   BMI 29.49 kg/m   Wt Readings from Last 3 Encounters:  05/11/23 151 lb (68.5 kg)  11/23/22 150 lb 3.2 oz (68.1 kg)  08/20/22 149 lb (67.6 kg)    BP Readings from Last 3 Encounters:  05/11/23 138/78  11/23/22 (!) 160/90  08/20/22 138/70    Physical Exam- Limited  Constitutional:  Body mass index is 29.49 kg/m. , not in acute distress, normal state of mind Eyes:  EOMI, no exophthalmos Musculoskeletal: no gross deformities, strength intact in all four extremities, no gross restriction of joint movements, walks with cane Skin:  no rashes, no hyperemia Neurological: no tremor with outstretched hands   Diabetic Foot Exam - Simple   No data filed    Recent Results (from the past 2160 hours)  Comprehensive metabolic panel     Status: Abnormal   Collection Time: 02/17/23  8:23 AM  Result Value Ref Range   Glucose 316 (H) 70 - 99 mg/dL   BUN 27 8 - 27 mg/dL  Creatinine, Ser 1.81 (H) 0.57 - 1.00 mg/dL   eGFR 32 (L) >16 XW/RUE/4.54   BUN/Creatinine Ratio 15 12 - 28   Sodium 137 134 - 144 mmol/L   Potassium 4.7 3.5 - 5.2 mmol/L   Chloride 100 96 - 106 mmol/L   CO2 24 20 - 29 mmol/L   Calcium  9.1 8.7 - 10.3 mg/dL   Total Protein 6.8 6.0 - 8.5 g/dL   Albumin 3.9 3.8 - 4.9 g/dL   Globulin, Total 2.9 1.5 - 4.5 g/dL   Bilirubin Total <0.9 0.0 - 1.2 mg/dL   Alkaline Phosphatase 139 (H) 44 - 121 IU/L   AST 23 0 - 40 IU/L   ALT 15 0 - 32 IU/L  Lipid panel      Status: Abnormal   Collection Time: 02/17/23  8:23 AM  Result Value Ref Range   Cholesterol, Total 229 (H) 100 - 199 mg/dL   Triglycerides 811 0 - 149 mg/dL   HDL 62 >91 mg/dL   VLDL Cholesterol Cal 24 5 - 40 mg/dL   LDL Chol Calc (NIH) 478 (H) 0 - 99 mg/dL   Chol/HDL Ratio 3.7 0.0 - 4.4 ratio    Comment:                                   T. Chol/HDL Ratio                                             Men  Women                               1/2 Avg.Risk  3.4    3.3                                   Avg.Risk  5.0    4.4                                2X Avg.Risk  9.6    7.1                                3X Avg.Risk 23.4   11.0   TSH     Status: None   Collection Time: 02/17/23  8:23 AM  Result Value Ref Range   TSH 1.880 0.450 - 4.500 uIU/mL  T4, free     Status: None   Collection Time: 02/17/23  8:23 AM  Result Value Ref Range   Free T4 1.33 0.82 - 1.77 ng/dL  HgB G9F     Status: Abnormal   Collection Time: 05/11/23  8:12 AM  Result Value Ref Range   Hemoglobin A1C 11.2 (A) 4.0 - 5.6 %   HbA1c POC (<> result, manual entry)     HbA1c, POC (prediabetic range)     HbA1c, POC (controlled diabetic range)         Assessment & Plan:   1) Uncontrolled type 1 diabetes mellitus-chronically uncontrolled  - Patient has currently uncontrolled symptomatic type 1 DM (administrative reclassified as type I for management  purposes) since  63 years of age.  She presents today with her logs, and meter, showing fluctuating glycemic profile with gross hyperglycemia overall.  Her POCT A1c today is 11.2%, increasing from last visit of 10.9%. She notes she has been eating more sugary snacks before bed.  Her fasting readings are at goal, sometimes tight.  She never did follow through with nephrology consult I initiated.   - Her diabetes is complicated by neuropathy, patient remains at a high risk for more acute and chronic complications of diabetes which include CAD, CVA, CKD, retinopathy, and  neuropathy. These are all discussed in detail with the patient.  - Nutritional counseling repeated at each appointment due to patients tendency to fall back in to old habits.  - The patient admits there is a room for improvement in their diet and drink choices. -  Suggestion is made for the patient to avoid simple carbohydrates from their diet including Cakes, Sweet Desserts / Pastries, Ice Cream, Soda (diet and regular), Sweet Tea, Candies, Chips, Cookies, Sweet Pastries, Store Bought Juices, Alcohol in Excess of 1-2 drinks a day, Artificial Sweeteners, Coffee Creamer, and "Sugar-free" Products. This will help patient to have stable blood glucose profile and potentially avoid unintended weight gain.   - I encouraged the patient to switch to unprocessed or minimally processed complex starch and increased protein intake (animal or plant source), fruits, and vegetables.   - Patient is advised to stick to a routine mealtimes to eat 3 meals a day and avoid unnecessary snacks (to snack only to correct hypoglycemia).  -She has seen Melva Stabile, RDE in the past for individualized education.  - I have approached patient with the following individualized plan to manage diabetes and patient agrees:   -She will continue to need intensive treatment with higher dose of basal/bolus insulin  in order for her to achieve and maintain control of diabetes to target.   -She is advised to continue her Tresiba  28 units SQ nightly and continue her Novolog  8-14 units TID with meals if glucose is above 90 and she is eating (Specific instructions on how to titrate insulin  dosage based on glucose readings given to patient in writing).  She is advised to avoid late night snacking or at the very least give some insulin  prior to eating it(around 4 units).   -She is encouraged start consistently monitoring blood glucose 4 times daily, before meals and at bedtime and report to the clinic if blood glucose levels are less than  70 or greater than 300 for 3 tests in a row. She did not like her CGM (alarmed too much for her).  - She is not a candidate for metformin, SGLT2 inhibitors, nor incretin therapy.  - Patient specific target  A1c;  LDL, HDL, Triglycerides, and  Waist Circumference were discussed in detail.  2) BP/HTN:  Her blood pressure is controlled to target, has been more consistent with taking her medication.   She is advised to continue Lisinopril -HCT 10-12.5 mg po daily.  I referred her to nephrology at last visit but she did not follow through. I will recheck CMP and UM prior to next visit.  3) Lipids/HPL: Her recent lipid panel from 02/17/23 shows uncontrolled LDL at 143 (worsening).  She notes she has not been taking her Lipitor consistently.  She is advised to restart Lipitor 10 mg po daily at bedtime.  Side effects and precautions discussed with her.     4)  Weight/Diet: Her Body mass index is 29.49 kg/m.  Further weight gain not recommended for her.  A few years ago, she did have significant weight deficit.  CDE Consult in progress , exercise, and detailed carbohydrates information provided.  Side effects and precautions discussed with her.  5) Vitamin D  deficiency.   Her recent vitamin D  level was 11.1 on 05/14/22.  She is not currently taking any supplementation.  I started her on Ergocalciferol  50000 units po weekly.  6) Chronic Care/Health Maintenance: -Patient is on ACEI/ARB medications and Statin medication and encouraged to continue to follow up with Ophthalmology, Podiatrist at least yearly or according to recommendations, and advised to   stay away from smoking. I have recommended yearly flu vaccine and pneumonia vaccination at least every 5 years; moderate intensity exercise for up to 150 minutes weekly; and  sleep for at least 7 hours a day.    - I advised patient to maintain close follow up with House, Levorn Reason, FNP for primary care needs.     I spent  40  minutes in the care of the  patient today including review of labs from CMP, Lipids, Thyroid Function, Hematology (current and previous including abstractions from other facilities); face-to-face time discussing  her blood glucose readings/logs, discussing hypoglycemia and hyperglycemia episodes and symptoms, medications doses, her options of short and long term treatment based on the latest standards of care / guidelines;  discussion about incorporating lifestyle medicine;  and documenting the encounter. Risk reduction counseling performed per USPSTF guidelines to reduce obesity and cardiovascular risk factors.     Please refer to Patient Instructions for Blood Glucose Monitoring and Insulin /Medications Dosing Guide"  in media tab for additional information. Please  also refer to " Patient Self Inventory" in the Media  tab for reviewed elements of pertinent patient history.  Obed Bellows participated in the discussions, expressed understanding, and voiced agreement with the above plans.  All questions were answered to her satisfaction. she is encouraged to contact clinic should she have any questions or concerns prior to her return visit.    Follow up plan: - Return in about 3 months (around 08/10/2023) for Diabetes F/U with A1c in office, Previsit labs, Bring meter and logs.  Hulon Magic, [facility] [address] [place], [place] [address] Phone: [phone] Fax: [phone]   05/11/2023, 8:33 AM

## 2023-08-13 LAB — COMPREHENSIVE METABOLIC PANEL WITH GFR
ALT: 13 IU/L (ref 0–32)
AST: 20 IU/L (ref 0–40)
Albumin: 3.9 g/dL (ref 3.8–4.9)
Alkaline Phosphatase: 122 IU/L — ABNORMAL HIGH (ref 44–121)
BUN/Creatinine Ratio: 22 (ref 12–28)
BUN: 44 mg/dL — ABNORMAL HIGH (ref 8–27)
Bilirubin Total: 0.4 mg/dL (ref 0.0–1.2)
CO2: 19 mmol/L — ABNORMAL LOW (ref 20–29)
Calcium: 9.1 mg/dL (ref 8.7–10.3)
Chloride: 102 mmol/L (ref 96–106)
Creatinine, Ser: 1.97 mg/dL — ABNORMAL HIGH (ref 0.57–1.00)
Globulin, Total: 2.8 g/dL (ref 1.5–4.5)
Glucose: 341 mg/dL — ABNORMAL HIGH (ref 70–99)
Potassium: 5.8 mmol/L — ABNORMAL HIGH (ref 3.5–5.2)
Sodium: 137 mmol/L (ref 134–144)
Total Protein: 6.7 g/dL (ref 6.0–8.5)
eGFR: 29 mL/min/1.73 — ABNORMAL LOW (ref >59)

## 2023-08-13 LAB — MICROALBUMIN / CREATININE URINE RATIO
Creatinine, Urine: 66.3 mg/dL
Microalb/Creat Ratio: 586 mg/g{creat} — ABNORMAL HIGH (ref 0–29)
Microalbumin, Urine: 388.6 ug/mL

## 2023-08-19 ENCOUNTER — Ambulatory Visit: Payer: MEDICAID | Admitting: Nurse Practitioner

## 2023-08-19 DIAGNOSIS — Z794 Long term (current) use of insulin: Secondary | ICD-10-CM

## 2023-08-19 DIAGNOSIS — E1022 Type 1 diabetes mellitus with diabetic chronic kidney disease: Secondary | ICD-10-CM

## 2023-08-19 DIAGNOSIS — E1065 Type 1 diabetes mellitus with hyperglycemia: Secondary | ICD-10-CM

## 2023-08-19 DIAGNOSIS — E782 Mixed hyperlipidemia: Secondary | ICD-10-CM

## 2023-08-19 DIAGNOSIS — I10 Essential (primary) hypertension: Secondary | ICD-10-CM

## 2023-08-19 DIAGNOSIS — E559 Vitamin D deficiency, unspecified: Secondary | ICD-10-CM

## 2023-08-23 ENCOUNTER — Telehealth: Payer: Self-pay | Admitting: Nurse Practitioner

## 2023-08-23 NOTE — Telephone Encounter (Signed)
 She wont need to repeat her labs before the visit.  I can use the ones from July.

## 2023-08-23 NOTE — Telephone Encounter (Signed)
 Pt is calling in wanting to know since she had to r/s her appt and it isn't until Nov would she have to do her labs again. Pt would like to have a call back.

## 2023-08-23 NOTE — Telephone Encounter (Signed)
 Called the pt to let her know of the below message.    Pt stated that she is needing a refill on her amlodipine  and her Accu Chek test strips she is out of both.

## 2023-08-24 ENCOUNTER — Other Ambulatory Visit: Payer: Self-pay | Admitting: *Deleted

## 2023-08-24 DIAGNOSIS — Z794 Long term (current) use of insulin: Secondary | ICD-10-CM

## 2023-08-24 DIAGNOSIS — I10 Essential (primary) hypertension: Secondary | ICD-10-CM

## 2023-08-24 DIAGNOSIS — E1065 Type 1 diabetes mellitus with hyperglycemia: Secondary | ICD-10-CM

## 2023-08-24 MED ORDER — AMLODIPINE BESYLATE 5 MG PO TABS: 5.0000 mg | ORAL_TABLET | Freq: Every day | ORAL | 3 refills | Status: AC

## 2023-08-24 MED ORDER — ACCU-CHEK GUIDE TEST VI STRP: ORAL_STRIP | 6 refills | Status: AC

## 2023-08-24 NOTE — Telephone Encounter (Signed)
 Refills have been sent to the patient's pharmacy on her chart.

## 2023-10-08 ENCOUNTER — Other Ambulatory Visit: Payer: Self-pay | Admitting: "Endocrinology

## 2023-10-08 MED ORDER — TRESIBA FLEXTOUCH 100 UNIT/ML ~~LOC~~ SOPN: 28.0000 [IU] | PEN_INJECTOR | Freq: Every day | SUBCUTANEOUS | 1 refills | Status: DC

## 2023-10-11 ENCOUNTER — Other Ambulatory Visit: Payer: Self-pay

## 2023-10-11 DIAGNOSIS — E1065 Type 1 diabetes mellitus with hyperglycemia: Secondary | ICD-10-CM

## 2023-10-11 NOTE — Telephone Encounter (Signed)
 Received message from after hours nurse line regarding pt needing refill for her Tresiba . Dr.Nida sent in a refill for her Tresiba  on 10/08/23.

## 2023-10-19 ENCOUNTER — Telehealth: Payer: Self-pay

## 2023-10-19 ENCOUNTER — Telehealth: Payer: Self-pay | Admitting: Nurse Practitioner

## 2023-10-19 ENCOUNTER — Other Ambulatory Visit (HOSPITAL_COMMUNITY): Payer: Self-pay

## 2023-10-19 NOTE — Telephone Encounter (Signed)
 Pt states she needs a PA for her Tresiba .  Pt is currently out

## 2023-10-19 NOTE — Telephone Encounter (Signed)
 Pharmacy Patient Advocate Encounter   Received notification from Pt Calls Messages that prior authorization for Tresiba  is required/requested.   Insurance verification completed.   The patient is insured through [employer].   Per test claim:  Lantus is preferred by the insurance.  If suggested medication is appropriate, Please send in a new RX and discontinue this one. If not, please advise as to why it's not appropriate so that we may request a Prior Authorization. Please note, some preferred medications may still require a PA.  If the suggested medications have not been trialed and there are no contraindications to their use, the PA will not be submitted, as it will not be approved.

## 2023-10-20 ENCOUNTER — Other Ambulatory Visit: Payer: Self-pay | Admitting: *Deleted

## 2023-10-20 DIAGNOSIS — E1065 Type 1 diabetes mellitus with hyperglycemia: Secondary | ICD-10-CM

## 2023-10-20 DIAGNOSIS — Z794 Long term (current) use of insulin: Secondary | ICD-10-CM

## 2023-10-20 MED ORDER — INSULIN GLARGINE 100 UNIT/ML ~~LOC~~ SOLN: 28.0000 [IU] | Freq: Every day | SUBCUTANEOUS | 0 refills | Status: DC

## 2023-10-20 NOTE — Telephone Encounter (Signed)
 We can change to Lantus, I have no issues with that.

## 2023-10-20 NOTE — Telephone Encounter (Signed)
 A prescription was sent in for the Lantus. Patient is to inject 28 units at bedtime. A 90 day supply was sent in to [facility] [place]. Patient was made aware.

## 2023-10-21 ENCOUNTER — Other Ambulatory Visit: Payer: Self-pay | Admitting: *Deleted

## 2023-10-21 DIAGNOSIS — Z794 Long term (current) use of insulin: Secondary | ICD-10-CM

## 2023-10-21 DIAGNOSIS — E1065 Type 1 diabetes mellitus with hyperglycemia: Secondary | ICD-10-CM

## 2023-10-21 MED ORDER — INSULIN GLARGINE 100 UNIT/ML SOLOSTAR PEN: 28.0000 [IU] | PEN_INJECTOR | Freq: Every day | SUBCUTANEOUS | 1 refills | Status: DC

## 2023-10-21 MED ORDER — INSULIN GLARGINE 100 UNIT/ML SOLOSTAR PEN
28.0000 [IU] | PEN_INJECTOR | Freq: Every day | SUBCUTANEOUS | 1 refills | Status: DC
Start: 2023-10-21 — End: 2023-10-21

## 2023-10-26 NOTE — Telephone Encounter (Signed)
 Addressed in new encounter.

## 2023-11-08 ENCOUNTER — Telehealth: Payer: Self-pay | Admitting: Nurse Practitioner

## 2023-11-08 ENCOUNTER — Other Ambulatory Visit: Payer: Self-pay | Admitting: Nurse Practitioner

## 2023-11-08 DIAGNOSIS — E1065 Type 1 diabetes mellitus with hyperglycemia: Secondary | ICD-10-CM

## 2023-11-08 NOTE — Telephone Encounter (Signed)
 Does she need new labs done?

## 2023-11-08 NOTE — Telephone Encounter (Signed)
 Yes, but just a CMP.  I entered the order.

## 2023-11-09 NOTE — Telephone Encounter (Signed)
 Called and let pt know

## 2023-11-12 LAB — COMPREHENSIVE METABOLIC PANEL WITH GFR
ALT: 16 IU/L (ref 0–32)
AST: 24 IU/L (ref 0–40)
Albumin: 3.9 g/dL (ref 3.9–4.9)
Alkaline Phosphatase: 148 IU/L — ABNORMAL HIGH (ref 49–135)
BUN/Creatinine Ratio: 16 (ref 12–28)
BUN: 33 mg/dL — ABNORMAL HIGH (ref 8–27)
Bilirubin Total: 0.2 mg/dL (ref 0.0–1.2)
CO2: 25 mmol/L (ref 20–29)
Calcium: 9.3 mg/dL (ref 8.7–10.3)
Chloride: 100 mmol/L (ref 96–106)
Creatinine, Ser: 2.04 mg/dL — ABNORMAL HIGH (ref 0.57–1.00)
Globulin, Total: 3.3 g/dL (ref 1.5–4.5)
Glucose: 133 mg/dL — ABNORMAL HIGH (ref 70–99)
Potassium: 4.8 mmol/L (ref 3.5–5.2)
Sodium: 139 mmol/L (ref 134–144)
Total Protein: 7.2 g/dL (ref 6.0–8.5)
eGFR: 27 mL/min/1.73 — ABNORMAL LOW (ref >59)

## 2023-11-17 ENCOUNTER — Encounter: Payer: Self-pay | Admitting: Nurse Practitioner

## 2023-11-17 ENCOUNTER — Ambulatory Visit (INDEPENDENT_AMBULATORY_CARE_PROVIDER_SITE_OTHER): Payer: MEDICAID | Admitting: Nurse Practitioner

## 2023-11-17 VITALS — BP 138/82 | HR 96 | Ht 60.0 in | Wt 150.8 lb

## 2023-11-17 DIAGNOSIS — Z91199 Patient's noncompliance with other medical treatment and regimen due to unspecified reason: Secondary | ICD-10-CM

## 2023-11-17 DIAGNOSIS — N184 Chronic kidney disease, stage 4 (severe): Secondary | ICD-10-CM

## 2023-11-17 DIAGNOSIS — E1065 Type 1 diabetes mellitus with hyperglycemia: Secondary | ICD-10-CM

## 2023-11-17 DIAGNOSIS — Z794 Long term (current) use of insulin: Secondary | ICD-10-CM | POA: Diagnosis not present

## 2023-11-17 DIAGNOSIS — E1022 Type 1 diabetes mellitus with diabetic chronic kidney disease: Secondary | ICD-10-CM

## 2023-11-17 DIAGNOSIS — E782 Mixed hyperlipidemia: Secondary | ICD-10-CM | POA: Diagnosis not present

## 2023-11-17 DIAGNOSIS — I10 Essential (primary) hypertension: Secondary | ICD-10-CM | POA: Diagnosis not present

## 2023-11-17 DIAGNOSIS — N183 Chronic kidney disease, stage 3 unspecified: Secondary | ICD-10-CM

## 2023-11-17 DIAGNOSIS — E559 Vitamin D deficiency, unspecified: Secondary | ICD-10-CM

## 2023-11-17 LAB — POCT GLYCOSYLATED HEMOGLOBIN (HGB A1C): Hemoglobin A1C: 11.1 % — AB (ref 4.0–5.6)

## 2023-11-17 MED ORDER — INSULIN GLARGINE 100 UNIT/ML SOLOSTAR PEN: 28.0000 [IU] | PEN_INJECTOR | Freq: Every day | SUBCUTANEOUS | 3 refills | Status: AC

## 2023-11-17 MED ORDER — NOVOLOG FLEXPEN 100 UNIT/ML ~~LOC~~ SOPN: 8.0000 [IU] | PEN_INJECTOR | Freq: Three times a day (TID) | SUBCUTANEOUS | 3 refills | Status: AC

## 2023-11-17 MED ORDER — DEXCOM G7 SENSOR MISC: 1.0000 | 3 refills | Status: AC

## 2023-11-17 MED ORDER — DEXCOM G7 RECEIVER DEVI: 1.0000 | Freq: Once | 0 refills | Status: AC

## 2023-11-17 NOTE — Progress Notes (Signed)
 11/17/2023  Endocrinology follow-up note    Subjective:    Patient ID: Katherine Patton, female    DOB: December 26, 1960. Patient is being seen in follow-up for management of chronically uncontrolled diabetes . PCP: Darice Hick, FNP.  Past Medical History:  Diagnosis Date   Diabetes mellitus without complication (HCC)    Uterine mass    Past Surgical History:  Procedure Laterality Date   CATARACT EXTRACTION     Social History   Socioeconomic History   Marital status: Single    Spouse name: Not on file   Number of children: Not on file   Years of education: Not on file   Highest education level: Not on file  Occupational History   Not on file  Tobacco Use   Smoking status: Never   Smokeless tobacco: Never  Vaping Use   Vaping status: Never Used  Substance and Sexual Activity   Alcohol use: No   Drug use: No   Sexual activity: Not Currently    Birth control/protection: None  Other Topics Concern   Not on file  Social History Narrative   Not on file   Social Drivers of Health   Financial Resource Strain: Not on file  Food Insecurity: Not on file  Transportation Needs: Not on file  Physical Activity: Not on file  Stress: Not on file  Social Connections: Not on file   Outpatient Encounter Medications as of 11/17/2023  Medication Sig   acetaminophen (TYLENOL) 500 MG tablet Take 500 mg by mouth every 6 (six) hours as needed for headache.   amLODipine  (NORVASC ) 5 MG tablet Take 1 tablet (5 mg total) by mouth daily.   atorvastatin  (LIPITOR) 10 MG tablet Take 1 tablet (10 mg total) by mouth daily.   Blood Glucose Monitoring Suppl (ACCU-CHEK GUIDE) w/Device KIT Use to monitor glucose 4 times daily.   Cholecalciferol  2000 units CAPS Take 1 capsule (2,000 Units total) by mouth daily with breakfast.   Continuous Glucose Receiver (DEXCOM G7 RECEIVER) DEVI 1 Device by Does not apply route once for 1 dose.   Continuous Glucose Sensor (DEXCOM G7 SENSOR) MISC Inject 1 Application  into the skin as directed. Change sensor every 10 days as directed.   glucose blood (ACCU-CHEK GUIDE TEST) test strip Use as instructed to monitor glucose 4 times daily   Insulin  Pen Needle (RELION PEN NEEDLES) 32G X 4 MM MISC Use three times daily with injections   lisinopril -hydrochlorothiazide  (ZESTORETIC ) 10-12.5 MG tablet Take 1 tablet by mouth daily.   Multiple Vitamins-Minerals (MULTIVITAMIN ADULTS 50+) TABS Take 1 tablet by mouth daily in the afternoon.   Vitamin D , Ergocalciferol , (DRISDOL ) 1.25 MG (50000 UNIT) CAPS capsule Take 1 capsule (50,000 Units total) by mouth every 7 (seven) days.   [DISCONTINUED] insulin  glargine (LANTUS) 100 UNIT/ML Solostar Pen Inject 28 Units into the skin at bedtime.   [DISCONTINUED] NOVOLOG  FLEXPEN 100 UNIT/ML FlexPen Inject 8-14 Units into the skin 3 (three) times daily with meals.   insulin  glargine (LANTUS) 100 UNIT/ML Solostar Pen Inject 28 Units into the skin at bedtime.   NOVOLOG  FLEXPEN 100 UNIT/ML FlexPen Inject 8-14 Units into the skin 3 (three) times daily with meals.   [DISCONTINUED] insulin  degludec (TRESIBA  FLEXTOUCH) 100 UNIT/ML FlexTouch Pen Inject 28 Units into the skin at bedtime. (Patient not taking: Reported on 11/17/2023)   No facility-administered encounter medications on file as of 11/17/2023.   ALLERGIES: No Known Allergies VACCINATION STATUS:  There is no immunization history on file for this patient.  Diabetes She presents for her follow-up diabetic visit. She has type 1 (She is administratively classified as type 1 diabetes for management purposes.) diabetes mellitus. Onset time: She was diagnosed at approximate age of 42 years. Her disease course has been stable. There are no hypoglycemic associated symptoms. Pertinent negatives for hypoglycemia include no confusion, pallor or seizures. Associated symptoms include fatigue, polydipsia and polyuria. Pertinent negatives for diabetes include no foot paresthesias, no polyphagia and no  weight loss. There are no hypoglycemic complications. Symptoms are improving. Diabetic complications include nephropathy and peripheral neuropathy. Risk factors for coronary artery disease include diabetes mellitus, sedentary lifestyle, hypertension, dyslipidemia and stress. Current diabetic treatment includes intensive insulin  program. She is compliant with treatment some of the time. Her weight is fluctuating minimally. She is following a generally unhealthy diet. When asked about meal planning, she reported none. She has had a previous visit with a dietitian. She never participates in exercise. Her home blood glucose trend is fluctuating dramatically. Her overall blood glucose range is >200 mg/dl. (She presents today with her logs, and meter, showing fluctuating glycemic profile with gross hyperglycemia overall.  Her POCT A1c today is 11.1%, improving slightly from last visit of 11.2%. She has not been taking her insulin  consistently, misses both her short-acting and long-acting insulins frequently.  She still never contacted Dr. Link office for the nephrology consult ordered.) An ACE inhibitor/angiotensin II receptor blocker is being taken. She does not see a podiatrist.Eye exam is current.    Review of systems  Constitutional: + stable body weight,  current Body mass index is 29.45 kg/m. , no fatigue, no subjective hyperthermia, no subjective hypothermia Eyes: no blurry vision, no xerophthalmia ENT: no sore throat, no nodules palpated in throat, no dysphagia/odynophagia, no hoarseness Cardiovascular: no chest pain, no shortness of breath, no palpitations, no leg swelling Respiratory: no cough, no shortness of breath Gastrointestinal: no nausea/vomiting/diarrhea Musculoskeletal: no muscle/joint aches, walks with cane Skin: no rashes, no hyperemia Neurological: no tremors, no dizziness, intermittent numbness/tingling to bilateral feet Psychiatric: no depression, no anxiety  Objective:    BP  138/82 (BP Location: Right Arm, Patient Position: Sitting, Cuff Size: Large)   Pulse 96   Ht 5' (1.524 m)   Wt 150 lb 12.8 oz (68.4 kg)   BMI 29.45 kg/m   Wt Readings from Last 3 Encounters:  11/17/23 150 lb 12.8 oz (68.4 kg)  05/11/23 151 lb (68.5 kg)  11/23/22 150 lb 3.2 oz (68.1 kg)    BP Readings from Last 3 Encounters:  11/17/23 138/82  05/11/23 138/78  11/23/22 (!) 160/90     Physical Exam- Limited  Constitutional:  Body mass index is 29.45 kg/m. , not in acute distress, normal state of mind Eyes:  EOMI, no exophthalmos Musculoskeletal: no gross deformities, strength intact in all four extremities, no gross restriction of joint movements Skin:  no rashes, no hyperemia Neurological: no tremor with outstretched hands   Diabetic Foot Exam - Simple   No data filed    Recent Results (from the past 2160 hours)  Comprehensive metabolic panel with GFR     Status: Abnormal   Collection Time: 11/11/23  8:24 AM  Result Value Ref Range   Glucose 133 (H) 70 - 99 mg/dL   BUN 33 (H) 8 - 27 mg/dL   Creatinine, Ser 7.95 (H) 0.57 - 1.00 mg/dL   eGFR 27 (L) >40 fO/fpw/8.26   BUN/Creatinine Ratio 16 12 - 28   Sodium 139 134 - 144 mmol/L  Potassium 4.8 3.5 - 5.2 mmol/L   Chloride 100 96 - 106 mmol/L   CO2 25 20 - 29 mmol/L   Calcium  9.3 8.7 - 10.3 mg/dL   Total Protein 7.2 6.0 - 8.5 g/dL   Albumin 3.9 3.9 - 4.9 g/dL   Globulin, Total 3.3 1.5 - 4.5 g/dL   Bilirubin Total 0.2 0.0 - 1.2 mg/dL   Alkaline Phosphatase 148 (H) 49 - 135 IU/L   AST 24 0 - 40 IU/L   ALT 16 0 - 32 IU/L  HgB A1c     Status: Abnormal   Collection Time: 11/17/23  3:27 PM  Result Value Ref Range   Hemoglobin A1C 11.1 (A) 4.0 - 5.6 %   HbA1c POC (<> result, manual entry)     HbA1c, POC (prediabetic range)     HbA1c, POC (controlled diabetic range)         Assessment & Plan:   1) Uncontrolled type 1 diabetes mellitus-chronically uncontrolled  - Patient has currently uncontrolled symptomatic type  1 DM (administrative reclassified as type I for management purposes) since  63 years of age.  She presents today with her logs, and meter, showing fluctuating glycemic profile with gross hyperglycemia overall.  Her POCT A1c today is 11.1%, improving slightly from last visit of 11.2%. She has not been taking her insulin  consistently, misses both her short-acting and long-acting insulins frequently.  She still never contacted Dr. Link office for the nephrology consult ordered.   - Her diabetes is complicated by neuropathy, patient remains at a high risk for more acute and chronic complications of diabetes which include CAD, CVA, CKD, retinopathy, and neuropathy. These are all discussed in detail with the patient.  - Nutritional counseling repeated/built upon at each appointment.  - The patient admits there is a room for improvement in their diet and drink choices. -  Suggestion is made for the patient to avoid simple carbohydrates from their diet including Cakes, Sweet Desserts / Pastries, Ice Cream, Soda (diet and regular), Sweet Tea, Candies, Chips, Cookies, Sweet Pastries, Store Bought Juices, Alcohol in Excess of 1-2 drinks a day, Artificial Sweeteners, Coffee Creamer, and Sugar-free Products. This will help patient to have stable blood glucose profile and potentially avoid unintended weight gain.   - I encouraged the patient to switch to unprocessed or minimally processed complex starch and increased protein intake (animal or plant source), fruits, and vegetables.   - Patient is advised to stick to a routine mealtimes to eat 3 meals a day and avoid unnecessary snacks (to snack only to correct hypoglycemia).  -She has seen Santana Duke, RDE in the past for individualized education.  - I have approached patient with the following individualized plan to manage diabetes and patient agrees:   -She will continue to need intensive treatment with higher dose of basal/bolus insulin  in order for  her to achieve and maintain control of diabetes to target.   -She is advised to START CONSISTENTLY taking her Tresiba  28 units SQ nightly and continue her Novolog  8-14 units TID with meals if glucose is above 90 and she is eating (Specific instructions on how to titrate insulin  dosage based on glucose readings given to patient in writing).     -She is encouraged start consistently monitoring blood glucose 4 times daily, before meals and at bedtime and report to the clinic if blood glucose levels are less than 70 or greater than 300 for 3 tests in a row. She did not like her CGM (  alarmed too much for her).  - She is not a candidate for metformin, SGLT2 inhibitors, nor incretin therapy.  - Patient specific target  A1c;  LDL, HDL, Triglycerides, and  Waist Circumference were discussed in detail.  2) BP/HTN:  Her blood pressure is controlled to target, has been more consistent with taking her medication.   She is advised to continue medication as prescribed by PCP.  I referred her to nephrology at last visit but she did not follow through. I did give her name and number for his clinic and re-entered the referral again today.  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  3) Lipids/HPL: Her recent lipid panel from 02/17/23 shows uncontrolled LDL at 143 (worsening).  She notes she has not been taking her Lipitor consistently.  She is advised to continue Lipitor 10 mg po daily at bedtime.  Side effects and precautions discussed with her.  Will recheck prior to next visit.   4)  Weight/Diet: Her Body mass index is 29.45 kg/m.   Further weight gain not recommended for her.  A few years ago, she did have significant weight deficit.  CDE Consult in progress , exercise, and detailed carbohydrates information provided.  Side effects and precautions discussed with her.  5) Vitamin D  deficiency.   Her recent vitamin D  level was 11.1 on 05/14/22.  She is not currently  taking any supplementation.  I started her on Ergocalciferol  50000 units po weekly.  Will recheck prior to next visit.  6) Chronic Care/Health Maintenance: -Patient is on ACEI/ARB medications and Statin medication and encouraged to continue to follow up with Ophthalmology, Podiatrist at least yearly or according to recommendations, and advised to   stay away from smoking. I have recommended yearly flu vaccine and pneumonia vaccination at least every 5 years; moderate intensity exercise for up to 150 minutes weekly; and  sleep for at least 7 hours a day.    - I advised patient to maintain close follow up with House, Darice LABOR, FNP for primary care needs.     I spent  48  minutes in the care of the patient today including review of labs from CMP, Lipids, Thyroid Function, Hematology (current and previous including abstractions from other facilities); face-to-face time discussing  her blood glucose readings/logs, discussing hypoglycemia and hyperglycemia episodes and symptoms, medications doses, her options of short and long term treatment based on the latest standards of care / guidelines;  discussion about incorporating lifestyle medicine;  and documenting the encounter. Risk reduction counseling performed per USPSTF guidelines to reduce obesity and cardiovascular risk factors.     Please refer to Patient Instructions for Blood Glucose Monitoring and Insulin /Medications Dosing Guide  in media tab for additional information. Please  also refer to  Patient Self Inventory in the Media  tab for reviewed elements of pertinent patient history.  Katherine Patton participated in the discussions, expressed understanding, and voiced agreement with the above plans.  All questions were answered to her satisfaction. she is encouraged to contact clinic should she have any questions or concerns prior to her return visit.    Follow up plan: - Return in about 3 months (around 02/17/2024) for Diabetes F/U with A1c  in office, Previsit labs, Bring meter and logs.  Benton Rio, [facility] [address] [place], [place] [address] Phone: [phone] Fax: [phone]   11/17/2023, 3:47 PM

## 2023-12-15 ENCOUNTER — Telehealth: Payer: Self-pay | Admitting: Pharmacy Technician

## 2023-12-15 ENCOUNTER — Other Ambulatory Visit (HOSPITAL_COMMUNITY): Payer: Self-pay

## 2023-12-15 NOTE — Telephone Encounter (Signed)
 Pharmacy Patient Advocate Encounter   Received notification from CoverMyMeds that prior authorization for Dexcom G7 Sensor  is required/requested.   Insurance verification completed.   The patient is insured through [employer].   Per test claim: PA required; PA submitted to above mentioned insurance via Latent Key/confirmation #/[id number] Status is pending

## 2023-12-16 ENCOUNTER — Other Ambulatory Visit (HOSPITAL_COMMUNITY): Payer: Self-pay

## 2023-12-16 ENCOUNTER — Telehealth: Payer: Self-pay | Admitting: Pharmacy Technician

## 2023-12-16 NOTE — Telephone Encounter (Signed)
 Pharmacy Patient Advocate Encounter   Received notification from CoverMyMeds that prior authorization for Dexcom G7 Receiver device  is required/requested.   Insurance verification completed.   The patient is insured through [employer].   Per test claim: PA required; PA submitted to above mentioned insurance via Latent Key/confirmation #/EOC [id number] Status is pending

## 2023-12-16 NOTE — Telephone Encounter (Deleted)
 Additional information has been requested from the patient's insurance in order to proceed with the prior authorization request. Requested information has been sent, or form has been filled out and faxed back to [phone]  Reference # [id number]

## 2023-12-16 NOTE — Telephone Encounter (Signed)
 Additional information has been requested from the patient's insurance in order to proceed with the prior authorization request. Requested information has been sent, or form has been filled out and faxed back to [phone]  Reference # [id number]

## 2023-12-17 ENCOUNTER — Other Ambulatory Visit (HOSPITAL_COMMUNITY): Payer: Self-pay

## 2023-12-17 NOTE — Telephone Encounter (Signed)
 Pharmacy Patient Advocate Encounter  Received notification from [facility] that Prior Authorization for [facility] has been APPROVED from 12/16/23 to 12/14/24. Ran test claim, Copay is $0.00. This test claim was processed through [facility]- copay amounts may vary at other pharmacies due to pharmacy/plan contracts, or as the patient moves through the different stages of their insurance plan.   PA #/Case ID/Reference #: [id number]

## 2023-12-17 NOTE — Telephone Encounter (Addendum)
 Pharmacy Patient Advocate Encounter  Received notification from [facility] that Prior Authorization for [facility] G7 Receiver device  has been APPROVED from 12/16/23 to 12/14/24. Ran test claim, Copay is $0.00. This test claim was processed through [facility]- copay amounts may vary at other pharmacies due to pharmacy/plan contracts, or as the patient moves through the different stages of their insurance plan.   PA #/Case ID/Reference #: [id number]

## 2024-01-24 ENCOUNTER — Other Ambulatory Visit: Payer: Self-pay

## 2024-02-23 ENCOUNTER — Ambulatory Visit: Payer: MEDICAID | Admitting: Nurse Practitioner
